# Patient Record
Sex: Female | Born: 1965 | Race: White | Hispanic: No | Marital: Single | Smoking: Current every day smoker
Health system: Southern US, Community
[De-identification: ages and names within clinical notes are randomized; demographics above are authoritative.]

## PROBLEM LIST (undated history)

## (undated) DIAGNOSIS — M1990 Unspecified osteoarthritis, unspecified site: Secondary | ICD-10-CM

## (undated) DIAGNOSIS — I639 Cerebral infarction, unspecified: Secondary | ICD-10-CM

## (undated) DIAGNOSIS — I2699 Other pulmonary embolism without acute cor pulmonale: Secondary | ICD-10-CM

## (undated) DIAGNOSIS — F329 Major depressive disorder, single episode, unspecified: Secondary | ICD-10-CM

## (undated) DIAGNOSIS — I8290 Acute embolism and thrombosis of unspecified vein: Secondary | ICD-10-CM

## (undated) DIAGNOSIS — G459 Transient cerebral ischemic attack, unspecified: Secondary | ICD-10-CM

## (undated) DIAGNOSIS — I82409 Acute embolism and thrombosis of unspecified deep veins of unspecified lower extremity: Secondary | ICD-10-CM

## (undated) DIAGNOSIS — F32A Depression, unspecified: Secondary | ICD-10-CM

## (undated) DIAGNOSIS — F419 Anxiety disorder, unspecified: Secondary | ICD-10-CM

## (undated) HISTORY — PX: HERNIA REPAIR: SHX51

## (undated) HISTORY — DX: Acute embolism and thrombosis of unspecified deep veins of unspecified lower extremity: I82.409

## (undated) HISTORY — DX: Cerebral infarction, unspecified: I63.9

## (undated) HISTORY — PX: TUBAL LIGATION: SHX77

## (undated) HISTORY — PX: SKIN GRAFT: SHX250

## (undated) HISTORY — DX: Transient cerebral ischemic attack, unspecified: G45.9

## (undated) HISTORY — PX: OTHER SURGICAL HISTORY: SHX169

## (undated) HISTORY — PX: ECTOPIC PREGNANCY SURGERY: SHX613

## (undated) HISTORY — DX: Acute embolism and thrombosis of unspecified vein: I82.90

---

## 1999-01-12 HISTORY — PX: HERNIA REPAIR: SHX51

## 2010-09-15 ENCOUNTER — Ambulatory Visit: Payer: Self-pay | Admitting: Family Medicine

## 2010-10-01 ENCOUNTER — Emergency Department: Payer: Self-pay | Admitting: *Deleted

## 2012-01-12 DIAGNOSIS — I82409 Acute embolism and thrombosis of unspecified deep veins of unspecified lower extremity: Secondary | ICD-10-CM

## 2012-01-12 DIAGNOSIS — G459 Transient cerebral ischemic attack, unspecified: Secondary | ICD-10-CM

## 2012-01-12 HISTORY — DX: Acute embolism and thrombosis of unspecified deep veins of unspecified lower extremity: I82.409

## 2012-01-12 HISTORY — DX: Transient cerebral ischemic attack, unspecified: G45.9

## 2012-02-18 ENCOUNTER — Emergency Department: Payer: Self-pay | Admitting: Emergency Medicine

## 2012-02-18 LAB — COMPREHENSIVE METABOLIC PANEL
Anion Gap: 10 (ref 7–16)
BUN: 5 mg/dL — ABNORMAL LOW (ref 7–18)
Chloride: 102 mmol/L (ref 98–107)
Creatinine: 0.79 mg/dL (ref 0.60–1.30)
EGFR (African American): >60
EGFR (Non-African Amer.): >60
Glucose: 163 mg/dL — ABNORMAL HIGH (ref 65–99)
Osmolality: 273 (ref 275–301)
Potassium: 3.8 mmol/L (ref 3.5–5.1)
SGPT (ALT): 16 U/L (ref 12–78)
Total Protein: 7.8 g/dL (ref 6.4–8.2)

## 2012-02-18 LAB — CBC WITH DIFFERENTIAL/PLATELET
Basophil #: 0.2 10*3/uL — ABNORMAL HIGH (ref 0.0–0.1)
Basophil %: 1.3 %
Eosinophil %: 1.1 %
Lymphocyte %: 13.7 %
Monocyte %: 7.3 %
Neutrophil #: 10.4 10*3/uL — ABNORMAL HIGH (ref 1.4–6.5)
Neutrophil %: 76.6 %
Platelet: 307 10*3/uL (ref 150–440)
WBC: 13.5 10*3/uL — ABNORMAL HIGH (ref 3.6–11.0)

## 2012-02-18 LAB — PROTIME-INR: Prothrombin Time: 15.5 secs — ABNORMAL HIGH (ref 11.5–14.7)

## 2012-02-18 LAB — APTT: Activated PTT: 36 secs — ABNORMAL HIGH (ref 23.6–35.9)

## 2012-02-18 LAB — RAPID INFLUENZA A&B ANTIGENS

## 2012-02-18 LAB — CK TOTAL AND CKMB (NOT AT ARMC)
CK, Total: 46 U/L (ref 21–215)
CK-MB: 1.7 ng/mL (ref 0.5–3.6)

## 2012-02-21 DIAGNOSIS — I2699 Other pulmonary embolism without acute cor pulmonale: Secondary | ICD-10-CM | POA: Insufficient documentation

## 2012-02-21 DIAGNOSIS — Z9889 Other specified postprocedural states: Secondary | ICD-10-CM | POA: Insufficient documentation

## 2012-02-21 DIAGNOSIS — I745 Embolism and thrombosis of iliac artery: Secondary | ICD-10-CM | POA: Insufficient documentation

## 2012-02-24 LAB — CULTURE, BLOOD (SINGLE)

## 2012-05-05 DIAGNOSIS — S31109A Unspecified open wound of abdominal wall, unspecified quadrant without penetration into peritoneal cavity, initial encounter: Secondary | ICD-10-CM | POA: Insufficient documentation

## 2012-05-15 ENCOUNTER — Encounter: Payer: Self-pay | Admitting: Family Medicine

## 2012-05-19 DIAGNOSIS — IMO0001 Reserved for inherently not codable concepts without codable children: Secondary | ICD-10-CM | POA: Insufficient documentation

## 2012-05-19 DIAGNOSIS — I70219 Atherosclerosis of native arteries of extremities with intermittent claudication, unspecified extremity: Secondary | ICD-10-CM | POA: Insufficient documentation

## 2012-06-11 ENCOUNTER — Encounter: Payer: Self-pay | Admitting: Family Medicine

## 2012-09-21 ENCOUNTER — Ambulatory Visit: Payer: Self-pay | Admitting: Internal Medicine

## 2015-04-18 ENCOUNTER — Emergency Department: Payer: Medicaid Other

## 2015-04-18 ENCOUNTER — Encounter: Payer: Self-pay | Admitting: Emergency Medicine

## 2015-04-18 ENCOUNTER — Inpatient Hospital Stay
Admission: EM | Admit: 2015-04-18 | Discharge: 2015-04-24 | DRG: 190 | Disposition: A | Discharge status: Home / Self Care | Payer: Medicaid Other | Attending: Internal Medicine | Admitting: Internal Medicine

## 2015-04-18 DIAGNOSIS — Z8249 Family history of ischemic heart disease and other diseases of the circulatory system: Secondary | ICD-10-CM | POA: Diagnosis not present

## 2015-04-18 DIAGNOSIS — I10 Essential (primary) hypertension: Secondary | ICD-10-CM | POA: Diagnosis present

## 2015-04-18 DIAGNOSIS — J441 Chronic obstructive pulmonary disease with (acute) exacerbation: Secondary | ICD-10-CM | POA: Diagnosis present

## 2015-04-18 DIAGNOSIS — Z86718 Personal history of other venous thrombosis and embolism: Secondary | ICD-10-CM | POA: Diagnosis not present

## 2015-04-18 DIAGNOSIS — Z7901 Long term (current) use of anticoagulants: Secondary | ICD-10-CM

## 2015-04-18 DIAGNOSIS — J189 Pneumonia, unspecified organism: Secondary | ICD-10-CM | POA: Diagnosis present

## 2015-04-18 DIAGNOSIS — Z86711 Personal history of pulmonary embolism: Secondary | ICD-10-CM

## 2015-04-18 DIAGNOSIS — Z79899 Other long term (current) drug therapy: Secondary | ICD-10-CM

## 2015-04-18 DIAGNOSIS — Z833 Family history of diabetes mellitus: Secondary | ICD-10-CM | POA: Diagnosis not present

## 2015-04-18 DIAGNOSIS — Z825 Family history of asthma and other chronic lower respiratory diseases: Secondary | ICD-10-CM | POA: Diagnosis not present

## 2015-04-18 DIAGNOSIS — F418 Other specified anxiety disorders: Secondary | ICD-10-CM | POA: Diagnosis present

## 2015-04-18 DIAGNOSIS — Z8673 Personal history of transient ischemic attack (TIA), and cerebral infarction without residual deficits: Secondary | ICD-10-CM | POA: Diagnosis not present

## 2015-04-18 DIAGNOSIS — J9601 Acute respiratory failure with hypoxia: Secondary | ICD-10-CM | POA: Diagnosis present

## 2015-04-18 DIAGNOSIS — Z87891 Personal history of nicotine dependence: Secondary | ICD-10-CM | POA: Diagnosis not present

## 2015-04-18 DIAGNOSIS — R0602 Shortness of breath: Secondary | ICD-10-CM

## 2015-04-18 DIAGNOSIS — J440 Chronic obstructive pulmonary disease with acute lower respiratory infection: Principal | ICD-10-CM | POA: Diagnosis present

## 2015-04-18 HISTORY — DX: Anxiety disorder, unspecified: F41.9

## 2015-04-18 HISTORY — DX: Depression, unspecified: F32.A

## 2015-04-18 HISTORY — DX: Unspecified osteoarthritis, unspecified site: M19.90

## 2015-04-18 HISTORY — DX: Other pulmonary embolism without acute cor pulmonale: I26.99

## 2015-04-18 HISTORY — DX: Acute embolism and thrombosis of unspecified deep veins of unspecified lower extremity: I82.409

## 2015-04-18 HISTORY — DX: Major depressive disorder, single episode, unspecified: F32.9

## 2015-04-18 LAB — PROTIME-INR
INR: 2.5
Prothrombin Time: 26.7 seconds — ABNORMAL HIGH (ref 11.4–15.0)

## 2015-04-18 LAB — CBC WITH DIFFERENTIAL/PLATELET
BASOS ABS: 0 10*3/uL (ref 0–0.1)
Basophils Relative: 0 %
EOS PCT: 2 %
Eosinophils Absolute: 0.2 10*3/uL (ref 0–0.7)
HCT: 47.2 % — ABNORMAL HIGH (ref 35.0–47.0)
Hemoglobin: 15.4 g/dL (ref 12.0–16.0)
LYMPHS PCT: 24 %
Lymphs Abs: 2.3 10*3/uL (ref 1.0–3.6)
MCH: 28.5 pg (ref 26.0–34.0)
MCHC: 32.6 g/dL (ref 32.0–36.0)
MCV: 87.6 fL (ref 80.0–100.0)
MONO ABS: 0.6 10*3/uL (ref 0.2–0.9)
Monocytes Relative: 6 %
Neutro Abs: 6.7 10*3/uL — ABNORMAL HIGH (ref 1.4–6.5)
Neutrophils Relative %: 68 %
PLATELETS: 247 10*3/uL (ref 150–440)
RBC: 5.39 MIL/uL — ABNORMAL HIGH (ref 3.80–5.20)
RDW: 15.1 % — AB (ref 11.5–14.5)
WBC: 9.8 10*3/uL (ref 3.6–11.0)

## 2015-04-18 LAB — BASIC METABOLIC PANEL
ANION GAP: 6 (ref 5–15)
BUN: 14 mg/dL (ref 6–20)
CALCIUM: 9.2 mg/dL (ref 8.9–10.3)
CO2: 29 mmol/L (ref 22–32)
CREATININE: 0.74 mg/dL (ref 0.44–1.00)
Chloride: 102 mmol/L (ref 101–111)
GFR calc Af Amer: >60 mL/min (ref >60)
Glucose, Bld: 80 mg/dL (ref 65–99)
Potassium: 3.3 mmol/L — ABNORMAL LOW (ref 3.5–5.1)
Sodium: 137 mmol/L (ref 135–145)

## 2015-04-18 LAB — TROPONIN I: Troponin I: <0.03 ng/mL (ref <0.031)

## 2015-04-18 LAB — RAPID INFLUENZA A&B ANTIGENS (ARMC ONLY): INFLUENZA B (ARMC): NEGATIVE

## 2015-04-18 LAB — RAPID INFLUENZA A&B ANTIGENS: Influenza A (ARMC): NEGATIVE

## 2015-04-18 MED ORDER — AZITHROMYCIN 250 MG PO TABS
250.0000 mg | ORAL_TABLET | Freq: Every day | ORAL | Status: DC
  Administered 2015-04-19 – 2015-04-24 (×6): 250 mg via ORAL
  Filled 2015-04-18 (×6): qty 1

## 2015-04-18 MED ORDER — ESCITALOPRAM OXALATE 10 MG PO TABS
10.0000 mg | ORAL_TABLET | Freq: Every day | ORAL | Status: DC
  Administered 2015-04-18 – 2015-04-23 (×6): 10 mg via ORAL
  Filled 2015-04-18 (×6): qty 1

## 2015-04-18 MED ORDER — LORATADINE 10 MG PO TABS
10.0000 mg | ORAL_TABLET | Freq: Every day | ORAL | Status: DC
  Administered 2015-04-19 – 2015-04-24 (×6): 10 mg via ORAL
  Filled 2015-04-18 (×6): qty 1

## 2015-04-18 MED ORDER — LORATADINE 10 MG PO TABS: 10.0000 mg | ORAL_TABLET | Freq: Every day | ORAL | Status: DC

## 2015-04-18 MED ORDER — ACETAMINOPHEN 650 MG RE SUPP: 650.0000 mg | Freq: Four times a day (QID) | RECTAL | Status: DC | PRN

## 2015-04-18 MED ORDER — IPRATROPIUM-ALBUTEROL 0.5-2.5 (3) MG/3ML IN SOLN
3.0000 mL | Freq: Once | RESPIRATORY_TRACT | Status: AC
  Administered 2015-04-18: 3 mL via RESPIRATORY_TRACT
  Filled 2015-04-18: qty 3

## 2015-04-18 MED ORDER — ADULT MULTIVITAMIN W/MINERALS CH
1.0000 | ORAL_TABLET | Freq: Every day | ORAL | Status: DC
  Administered 2015-04-19 – 2015-04-24 (×6): 1 via ORAL
  Filled 2015-04-18 (×6): qty 1

## 2015-04-18 MED ORDER — DEXTROSE 5 % IV SOLN
2.0000 g | Freq: Once | INTRAVENOUS | Status: AC
  Administered 2015-04-18: 2 g via INTRAVENOUS
  Filled 2015-04-18: qty 2

## 2015-04-18 MED ORDER — ENOXAPARIN SODIUM 40 MG/0.4ML ~~LOC~~ SOLN
40.0000 mg | SUBCUTANEOUS | Status: DC
  Administered 2015-04-18: 23:00:00 40 mg via SUBCUTANEOUS
  Filled 2015-04-18: qty 0.4

## 2015-04-18 MED ORDER — METHYLPREDNISOLONE SODIUM SUCC 125 MG IJ SOLR
60.0000 mg | Freq: Four times a day (QID) | INTRAMUSCULAR | Status: DC
  Administered 2015-04-18 – 2015-04-22 (×16): 60 mg via INTRAVENOUS
  Filled 2015-04-18 (×16): qty 2

## 2015-04-18 MED ORDER — ADULT MULTIVITAMIN W/MINERALS CH: 1.0000 | ORAL_TABLET | Freq: Every day | ORAL | Status: DC

## 2015-04-18 MED ORDER — WARFARIN SODIUM 4 MG PO TABS
4.0000 mg | ORAL_TABLET | Freq: Every evening | ORAL | Status: DC
  Administered 2015-04-18 – 2015-04-20 (×3): 4 mg via ORAL
  Filled 2015-04-18 (×4): qty 1

## 2015-04-18 MED ORDER — ACETAMINOPHEN 325 MG PO TABS: 650.0000 mg | ORAL_TABLET | Freq: Four times a day (QID) | ORAL | Status: DC | PRN

## 2015-04-18 MED ORDER — IPRATROPIUM-ALBUTEROL 0.5-2.5 (3) MG/3ML IN SOLN
3.0000 mL | Freq: Four times a day (QID) | RESPIRATORY_TRACT | Status: DC
  Administered 2015-04-18 – 2015-04-24 (×22): 3 mL via RESPIRATORY_TRACT
  Filled 2015-04-18 (×23): qty 3

## 2015-04-18 MED ORDER — DEXTROSE 5 % IV SOLN
500.0000 mg | INTRAVENOUS | Status: AC
  Administered 2015-04-18: 500 mg via INTRAVENOUS
  Filled 2015-04-18: qty 500

## 2015-04-18 MED ORDER — BUDESONIDE 0.25 MG/2ML IN SUSP
0.2500 mg | Freq: Two times a day (BID) | RESPIRATORY_TRACT | Status: DC
  Administered 2015-04-18 – 2015-04-24 (×12): 0.25 mg via RESPIRATORY_TRACT
  Filled 2015-04-18 (×12): qty 2

## 2015-04-18 MED ORDER — CLONAZEPAM 0.5 MG PO TABS
0.5000 mg | ORAL_TABLET | Freq: Two times a day (BID) | ORAL | Status: DC
  Administered 2015-04-18 – 2015-04-24 (×12): 0.5 mg via ORAL
  Filled 2015-04-18 (×12): qty 1

## 2015-04-18 MED ORDER — ENOXAPARIN SODIUM 40 MG/0.4ML ~~LOC~~ SOLN: 40.0000 mg | SUBCUTANEOUS | Status: DC

## 2015-04-18 MED ORDER — DEXTROSE 5 % IV SOLN
2.0000 g | INTRAVENOUS | Status: DC
  Administered 2015-04-19 – 2015-04-23 (×5): 2 g via INTRAVENOUS
  Filled 2015-04-18 (×6): qty 2

## 2015-04-18 NOTE — H&P (Signed)
[facility] [place] [facility]   PATIENT NAME: Anna Adkins    MR#:  [id number]  DATE OF BIRTH:  1966/01/07  DATE OF ADMISSION:  04/18/2015  PRIMARY CARE PHYSICIAN: Pricilla Holm, MD   REQUESTING/REFERRING PHYSICIAN: Dr. Sharyn Creamer  CHIEF COMPLAINT:   Chief Complaint  Patient presents with  . hypoxia     HISTORY OF PRESENT ILLNESS:  Anna Adkins  is a 50 y.o. female sent in by PMD to the ER for hypoxia. The patient has not felt well for a while. She is having some chest tightness, no energy and feeling exhausted. She's been coughing sneezing and short of breath. She's been wheezing. About a week ago, she had a headache for about 3 days. On Wednesday she saw her doctor and her pulse ox was low at that time in the 60s. She received a nebulizer treatment and a shot of antibiotic. She was brought back for reevaluation today and found to have a low pulse ox and sent into the ER for further evaluation. Hospitalist services were contacted for further evaluation  PAST MEDICAL HISTORY:   Past Medical History  Diagnosis Date  . Arthritis   . Pulmonary emboli (HCC)   . DVT (deep venous thrombosis) (HCC)   . Depression   . Anxiety     PAST SURGICAL HISTORY:   Past Surgical History  Procedure Laterality Date  . Tubal ligation    . Hernia repair    . Blood clot removal    . Skin graft      SOCIAL HISTORY:   Social History  Substance Use Topics  . Smoking status: Former Games developer  . Smokeless tobacco: Not on file  . Alcohol Use: No    FAMILY HISTORY:   Family History  Problem Relation Age of Onset  . Healthy Mother   . Heart disease Father   . Diabetes Father   . Asthma Father     DRUG ALLERGIES:   Allergies  Allergen Reactions  . Sulfa Antibiotics Anaphylaxis    REVIEW OF SYSTEMS:  CONSTITUTIONAL: Low-grade fever, hot and cold feeling, positive for fatigue. Positive for weight loss. No appetite. EYES: No blurred or double  vision. Wears glasses EARS, NOSE, AND THROAT: No tinnitus or ear pain. Positive for sore throat. Positive for sinus infection RESPIRATORY: Positive for cough and shortness of breath, positive for wheezing. No hemoptysis. Cough productive of greenish phlegm. CARDIOVASCULAR: Positive for chest pain, no orthopnea, no edema.  GASTROINTESTINAL: No nausea, vomiting, diarrhea or abdominal pain. No blood in bowel movements GENITOURINARY: No dysuria, hematuria.  ENDOCRINE: No polyuria, nocturia,  HEMATOLOGY: No anemia, easy bruising or bleeding SKIN: No rash or lesion. MUSCULOSKELETAL: No joint pain or arthritis.   NEUROLOGIC: No tingling, numbness, weakness.  PSYCHIATRY: Positive for anxiety and depression.   MEDICATIONS AT HOME:   Prior to Admission medications   Medication Sig Start Date End Date Taking? Authorizing Provider  Biotin 1000 MCG tablet Take 1,000 mcg by mouth daily.   Yes Historical Provider, MD  clonazePAM (KLONOPIN) 0.5 MG tablet Take 0.5 mg by mouth 2 (two) times daily.   Yes Historical Provider, MD  escitalopram (LEXAPRO) 10 MG tablet Take 10 mg by mouth at bedtime.   Yes Historical Provider, MD  loratadine (CLARITIN) 10 MG tablet Take 10 mg by mouth daily.   Yes Historical Provider, MD  Multiple Vitamin (MULTIVITAMIN WITH MINERALS) TABS tablet Take 1 tablet by mouth daily.   Yes Historical Provider, MD  warfarin (  COUMADIN) 4 MG tablet Take 4 mg by mouth every evening.   Yes Historical Provider, MD      VITAL SIGNS:  Blood pressure 100/41, pulse 73, temperature 98.3 F (36.8 C), temperature source Oral, resp. rate 18, height  (1.778 m), weight 113.399 kg (250 lb), SpO2 91 %.  PHYSICAL EXAMINATION:  GENERAL:  50 y.o.-year-old patient lying in the bed with no acute distress.  EYES: Pupils equal, round, reactive to light and accommodation. No scleral icterus. Extraocular muscles intact.  HEENT: Head atraumatic, normocephalic. Oropharynx and nasopharynx clear.  NECK:   Supple, no jugular venous distention. No thyroid enlargement, no tenderness.  LUNGS: Decreased breath sounds bilaterally, poor air entry, positive for wheezing throughout entire lung field. No rales, rhonchi or crepitation. No use of accessory muscles of respiration.  CARDIOVASCULAR: S1, S2 normal. No murmurs, rubs, or gallops.  ABDOMEN: Soft, nontender, nondistended. Bowel sounds present. No organomegaly or mass.  EXTREMITIES: No pedal edema, cyanosis, or clubbing.  NEUROLOGIC: Cranial nerves II through XII are intact. Muscle strength 5/5 in all extremities. Sensation intact. Gait not checked.  PSYCHIATRIC: The patient is alert and oriented x 3.  SKIN: No rash, lesion, or ulcer.   LABORATORY PANEL:   CBC  Recent Labs Lab 04/18/15 1339  WBC 9.8  HGB 15.4  HCT 47.2*  PLT 247   ------------------------------------------------------------------------------------------------------------------  Chemistries   Recent Labs Lab 04/18/15 1339  NA 137  K 3.3*  CL 102  CO2 29  GLUCOSE 80  BUN 14  CREATININE 0.74  CALCIUM 9.2   ------------------------------------------------------------------------------------------------------------------  Cardiac Enzymes  Recent Labs Lab 04/18/15 1339  TROPONINI <0.03   ------------------------------------------------------------------------------------------------------------------  RADIOLOGY:  Dg Chest 2 View  04/18/2015  CLINICAL DATA:  50 year old presenting with acute hypoxia while at her primary provider's office earlier today, oxygen saturation on room air 88%. Recent onset of shortness of breath and productive cough. Patient currently being treated for sinusitis with antibiotics and prednisone EXAM: CHEST  2 VIEW COMPARISON:  02/18/2012. FINDINGS: Cardiac silhouette upper normal in size, unchanged. Thoracic aorta mildly atherosclerotic, unchanged. Hilar and mediastinal contours otherwise unremarkable. Linear atelectasis in the left  upper lobe. Streaky and patchy airspace opacity in the right middle lobe. Prominent bronchovascular markings diffusely and moderate central peribronchial thickening. No pleural effusions. Pulmonary vascularity normal. Mild degenerative changes involving the thoracic spine with slight exaggeration of the usual thoracic kyphosis. IMPRESSION: Right middle lobe bronchopneumonia superimposed upon moderate changes of acute bronchitis and/or asthma. Linear atelectasis in the left upper lobe. Electronically Signed   By: Hulan Saas M.D.   On: 04/18/2015 14:56    EKG:   Sinus rhythm 72 bpm  IMPRESSION AND PLAN:   1.  Acute respiratory failure with hypoxia. Pulse ox 78 percent on room air. Continue oxygen supplementation. 2. COPD exacerbation start high-dose Solu-Medrol DuoNeb nebulizer solution and budesonide nebulizers. Antibiotics were ordered. 3. Right middle lobe pneumonia. Start Rocephin and Zithromax. Follow-up blood cultures. Order sputum culture. 4. Patient's blood pressure is normally on the lower side 5. 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 On chronic Coumadin and INR is therapeutic. 6. History of CVA 7. Anxiety depression continue current medications   All the records are reviewed and case discussed with ED provider. Management plans discussed with the patient, family and they are in agreement.  CODE STATUS: Full code  TOTAL TIME TAKING CARE OF THIS PATIENT: 50 minutes.    Alford Highland M.D on 04/18/2015 at  4:08 PM  Between 7am to 6pm - Pager - [phone]  After 6pm call admission pager [phone]  [facility] Office  [phone]  CC: Primary care physician; Pricilla HolmSHARPE, LESLIE M, MD

## 2015-04-18 NOTE — ED Provider Notes (Signed)
[facility] Emergency Department Provider Note  ____________________________________________  Time seen: Approximately 2:12 PM  I have reviewed the triage vital signs and the nursing notes.   HISTORY  Chief Complaint hypoxia     HPI Anna Adkins is a 50 y.o. female day history of multiple DVTs and pulmonary embolism currently on Coumadin.  Patient reports about 5 days ago she started to develop a slight cough, congestion, and runny nose. She states that the last couple days it sort of "moved into her lungs" with a productive cough with green sputum. She developed some very mild shortness of breath, mostly with walking. Denies any increased leg swelling that she has chronically swollen legs due to surgery associated with previous large DVTs.  She is not having any chest pain, no sharp discomfort in the chest. She states she feels much better now she is on oxygen.  Prior history DVT as well as pulmonary mode him  Past Medical History  Diagnosis Date  . Arthritis   . Pulmonary emboli (HCC)   . DVT (deep venous thrombosis) (HCC)     There are no active problems to display for this patient.   No past surgical history on file.  Current Outpatient Rx  Name  Route  Sig  Dispense  Refill  . Biotin 1000 MCG tablet   Oral   Take 1,000 mcg by mouth daily.         . clonazePAM (KLONOPIN) 0.5 MG tablet   Oral   Take 0.5 mg by mouth 2 (two) times daily.         [name]. escitalopram (LEXAPRO) 10 MG tablet   Oral   Take 10 mg by mouth at bedtime.         [name]. loratadine (CLARITIN) 10 MG tablet   Oral   Take 10 mg by mouth daily.         . Multiple Vitamin (MULTIVITAMIN WITH MINERALS) TABS tablet   Oral   Take 1 tablet by mouth daily.         [name]. warfarin (COUMADIN) 4 MG tablet   Oral   Take 4 mg by mouth every evening.           Allergies Sulfa antibiotics  No family history on file.  Social History Social History  Substance Use Topics   . Smoking status: Never Smoker   . Smokeless tobacco: Not on file  . Alcohol Use: No    Review of Systems Constitutional: No fever/chills Eyes: No visual changes. ENT: No sore throat. Was a little scratchy, also slight runny nose. Cardiovascular: Denies chest pain. Respiratory: No wheezing. See history of present illness Gastrointestinal: No abdominal pain.  No nausea, no vomiting.  No diarrhea.  No constipation. Genitourinary: Negative for dysuria. Musculoskeletal: Negative for back pain. Skin: Negative for rash. Neurological: Negative for headaches, focal weakness or numbness.  10-point ROS otherwise negative.  ____________________________________________   PHYSICAL EXAM:  VITAL SIGNS: ED Triage Vitals  Enc Vitals Group     BP 04/18/15 1321 106/67 mmHg     Pulse Rate 04/18/15 1321 79     Resp 04/18/15 1321 18     Temp 04/18/15 1321 98.3 F (36.8 C)     Temp Source 04/18/15 1321 Oral     SpO2 04/18/15 1321 78 %     Weight 04/18/15 1321 250 lb (113.399 kg)     Height 04/18/15 1321 5\' 10"  (1.778 m)     Head Cir --  Peak Flow --      Pain Score 04/18/15 1323 0     Pain Loc --      Pain Edu? --      Excl. in GC? --    Constitutional: Alert and oriented. Well appearing and in no acute distress. Eyes: Conjunctivae are normal. PERRL. EOMI. Head: Atraumatic. Nose: No congestion/rhinnorhea. Mouth/Throat: Mucous membranes are moist.  Oropharynx non-erythematous. Neck: No stridor.   Cardiovascular: Normal rate, regular rhythm. Grossly normal heart sounds.  Good peripheral circulation. Respiratory: Normal respiratory effort.  No retractions. Lungs clear in the upper lobes, questionable mild rails versus slight rhonchi in the lower lobes bilateral. Gastrointestinal: Soft and nontender. No distention. No abdominal bruits. No CVA tenderness. Musculoskeletal: No lower extremity tenderness though she does have 1+ bilateral lower extremity edema which she reports is chronic  and unchanged.  No joint effusions. Neurologic:  Normal speech and language. No gross focal neurologic deficits are appreciated. Skin:  Skin is warm, dry and intact. No rash noted. Psychiatric: Mood and affect are normal. Speech and behavior are normal.  ____________________________________________   LABS (all labs ordered are listed, but only abnormal results are displayed)  Labs Reviewed  CBC WITH DIFFERENTIAL/PLATELET - Abnormal; Notable for the following:    RBC 5.39 (*)    HCT 47.2 (*)    RDW 15.1 (*)    Neutro Abs 6.7 (*)    All other components within normal limits  BASIC METABOLIC PANEL - Abnormal; Notable for the following:    Potassium 3.3 (*)    All other components within normal limits  PROTIME-INR - Abnormal; Notable for the following:    Prothrombin Time 26.7 (*)    All other components within normal limits  RAPID INFLUENZA A&B ANTIGENS ([facility] ONLY)  CULTURE, BLOOD (ROUTINE X 2)  CULTURE, BLOOD (ROUTINE X 2)  TROPONIN I   ____________________________________________  EKG  ED ECG REPORT I, QUALE, MARK, the attending physician, personally viewed and interpreted this ECG.  Date: 04/18/2015 EKG Time: 1432 Rate: 70 Rhythm: normal sinus rhythm QRS Axis: normal Intervals: normal ST/T Wave abnormalities: Some J-point elevation, probable early re-pole pattern Conduction Disturbances: none Narrative Interpretation: unremarkable except for some probable early repolarization  The patient is not having any chest pain.  ____________________________________________  RADIOLOGY  G Chest 2 View (Final result) Result time: 04/18/15 14:56:46   Final result by Rad Results In Interface (04/18/15 14:56:46)   Narrative:   CLINICAL DATA: 50 year old presenting with acute hypoxia while at her primary provider's office earlier today, oxygen saturation on room air 88%. Recent onset of shortness of breath and productive cough. Patient currently being treated for sinusitis  with antibiotics and prednisone  EXAM: CHEST 2 VIEW  COMPARISON: 02/18/2012.  FINDINGS: Cardiac silhouette upper normal in size, unchanged. Thoracic aorta mildly atherosclerotic, unchanged. Hilar and mediastinal contours otherwise unremarkable. Linear atelectasis in the left upper lobe. Streaky and patchy airspace opacity in the right middle lobe. Prominent bronchovascular markings diffusely and moderate central peribronchial thickening. No pleural effusions. Pulmonary vascularity normal. Mild degenerative changes involving the thoracic spine with slight exaggeration of the usual thoracic kyphosis.  IMPRESSION: Right middle lobe bronchopneumonia superimposed upon moderate changes of acute bronchitis and/or asthma. Linear atelectasis in the left upper lobe.   Electronically Signed By: Hulan Saas M.D. On: 04/18/2015 14:56       ____________________________________________   PROCEDURES  Procedure(s) performed: None  Critical Care performed: Yes, see critical care note(s)  CRITICAL CARE Performed by: Sharyn Creamer  Total critical care time: 35 minutes  Critical care time was exclusive of separately billable procedures and treating other patients.  Critical care was necessary to treat or prevent imminent or life-threatening deterioration.  Critical care was time spent personally by me on the following activities: development of treatment plan with patient and/or surrogate as well as nursing, discussions with consultants, evaluation of patient's response to treatment, examination of patient, obtaining history from patient or surrogate, ordering and performing treatments and interventions, ordering and review of laboratory studies, ordering and review of radiographic studies, pulse oximetry and re-evaluation of patient's condition.  Patient presented with oxygen saturation less than 80% requiring evaluation for possible respiratory  compromise. ____________________________________________   INITIAL IMPRESSION / ASSESSMENT AND PLAN / ED COURSE  Pertinent labs & imaging results that were available during my care of the patient were reviewed by me and considered in my medical decision making (see chart for details).  Patient with productive cough, hypoxia and recent upper respiratory symptoms. Her symptoms seem to suggest most likely diagnosis of pneumonia, however she does have a history of pulmonary Moses and though she does not have any pleuritic pain or chest discomfort. She does not have any symptoms of acute coronary syndrome, her troponin is negative and her EKG demonstrates what is probably early repolarization and I doubt acute ischemia.  Patient's INR is noted be therapeutic, making pulmonary embolism certainly file is likely in review of her chest x-ray at this time appears consistent with a right middle lobe pneumonia along with her symptomatology. We will admit her, place her on antibiotics. She has not been hospitalized recently, does not have any obvious risk factors for healthcare associated pneumonia. Resting comfortable on oxygen. ____________________________________________   FINAL CLINICAL IMPRESSION(S) / ED DIAGNOSES  Final diagnoses:  Community acquired pneumonia      Sharyn Creamer, MD 04/18/15 1539

## 2015-04-18 NOTE — ED Notes (Addendum)
Patient called RN to room, patient c/o left arm pain where IV is infusing. Some redness noted on the arm, no swelling noted.  Patient also noted that her bracelet is too tight on her arm. Bracelet removed and new bracelet applied to other arm. Will monitor redness.

## 2015-04-18 NOTE — ED Notes (Signed)
Seen by PCP today for follow up.  Treated for sinus infection on Wednesday and at that time SATS low.  Today returned to PCP, initial RA SpO2 88 and improved to 94 % after albuterol neb.  Denies c/o SOB. Reports productive cough thick green sputum.

## 2015-04-18 NOTE — ED Notes (Signed)
IV antibiotics restarted, patient tolerating with no problems.

## 2015-04-18 NOTE — ED Notes (Signed)
Attempted to call report to 1C, nurse in another room at this time, will call me back

## 2015-04-18 NOTE — ED Notes (Signed)
Patient sent to ED by her PCP for hypoxia. Patient states that she was being treated for a sinus infection, patient was given antibiotics and prednisone. Patient went back for check up and her O2 sats were 88%.   Patient reports shortness of breath and productive cough with green/yellow sputum. Patient denies fever. Patient states that she has been sleeping more than normal and has had headaches.

## 2015-04-19 LAB — CBC
HCT: 42 % (ref 35.0–47.0)
HEMOGLOBIN: 13.8 g/dL (ref 12.0–16.0)
MCH: 29 pg (ref 26.0–34.0)
MCHC: 32.9 g/dL (ref 32.0–36.0)
MCV: 87.9 fL (ref 80.0–100.0)
PLATELETS: 244 10*3/uL (ref 150–440)
RBC: 4.78 MIL/uL (ref 3.80–5.20)
RDW: 15.5 % — ABNORMAL HIGH (ref 11.5–14.5)
WBC: 7.2 10*3/uL (ref 3.6–11.0)

## 2015-04-19 LAB — INFLUENZA PANEL BY PCR (TYPE A & B)
H1N1 flu by pcr: NOT DETECTED
Influenza A By PCR: NEGATIVE
Influenza B By PCR: NEGATIVE

## 2015-04-19 LAB — BASIC METABOLIC PANEL
ANION GAP: 7 (ref 5–15)
BUN: 18 mg/dL (ref 6–20)
CHLORIDE: 100 mmol/L — AB (ref 101–111)
CO2: 29 mmol/L (ref 22–32)
Calcium: 9 mg/dL (ref 8.9–10.3)
Creatinine, Ser: 0.82 mg/dL (ref 0.44–1.00)
GFR calc non Af Amer: >60 mL/min (ref >60)
Glucose, Bld: 163 mg/dL — ABNORMAL HIGH (ref 65–99)
POTASSIUM: 4.1 mmol/L (ref 3.5–5.1)
SODIUM: 136 mmol/L (ref 135–145)

## 2015-04-19 LAB — PROTIME-INR
INR: 2.59
PROTHROMBIN TIME: 27.4 s — AB (ref 11.4–15.0)

## 2015-04-19 MED ORDER — WARFARIN - PHYSICIAN DOSING INPATIENT
Freq: Every day | Status: DC
  Administered 2015-04-19: 18:00:00

## 2015-04-19 MED ORDER — GUAIFENESIN ER 600 MG PO TB12
600.0000 mg | ORAL_TABLET | Freq: Two times a day (BID) | ORAL | Status: DC
  Administered 2015-04-19 – 2015-04-24 (×11): 600 mg via ORAL
  Filled 2015-04-19 (×11): qty 1

## 2015-04-19 MED ORDER — SALINE SPRAY 0.65 % NA SOLN
1.0000 | NASAL | Status: DC | PRN
  Administered 2015-04-19 – 2015-04-20 (×2): 1 via NASAL
  Filled 2015-04-19: qty 44

## 2015-04-19 NOTE — Progress Notes (Signed)
Initial Nutrition Assessment  DOCUMENTATION CODES:   Obesity unspecified  INTERVENTION:   Meals and Snacks: Cater to patient preferences; discussed smaller, more frequent meals until appetite returns. Pt agreeable to snacks between meals, orders entered. Pt on Regular diet, family/friends may continue to bring in outside food if pt prefers  NUTRITION DIAGNOSIS:   Inadequate oral intake related to acute illness, poor appetite as evidenced by per patient/family report.  GOAL:   Patient will meet greater than or equal to 90% of their needs  MONITOR:   PO intake, Weight trends  REASON FOR ASSESSMENT:   Malnutrition Screening Tool    ASSESSMENT:   50 yo female admitted with acute respiratory failure due to COPD exacerbation and pneumonia  Past Medical History  Diagnosis Date  . Arthritis   . Pulmonary emboli (HCC)   . DVT (deep venous thrombosis) (HCC)   . Depression   . Anxiety      Diet Order:  Diet regular Room service appropriate?: Yes; Fluid consistency:: Thin   Energy Intake: pt eating outside food on visit today (chicken sandwich from [employer])  Food and Nutrition Related History: pt does report poor appetite over the past 3-4 weeks; appetite down due to sinus drainage and altered taste per pt. Pt eating some but not as good as she normally does   Skin:  Reviewed, no issues  Last BM:  04/18/15   Labs: reviewed  Meds: MVI, solumedrol  Nutrition Focused Physical Exam: Nutrition-Focused physical exam completed. Findings are WDL for fat depletion, muscle depletion, and edema.    Height:   Ht Readings from Last 1 Encounters:  04/18/15 5\' 10"  (1.778 m)    Weight: pt reports 10 pound wt loss in 3 weeks; 3.8% wt loss. Pt reports she has been trying to lose wt (5-10 pounds per month is what was recommended to her by MD) but pt thinks was concerned when she lost 10 pounds in <1 month  Wt Readings from Last 1 Encounters:  04/18/15 250 lb (113.399 kg)     BMI:  Body mass index is 35.87 kg/(m^2).  Estimated Nutritional Needs:   Kcal:  2000-2300 kcals   Protein:  68-82 g   Fluid:  >2 L per day  EDUCATION NEEDS:   No education needs identified at this time  Romelle StarcherCate Willadeen Colantuono MS, RD, LDN [phone] Pager  [phone] Weekend/On-Call Pager

## 2015-04-19 NOTE — Progress Notes (Signed)
[facility] [place] [facility]                                                                                                                                                                                            Patient Demographics   Anna Adkins, is a 50 y.o. female, DOB - 05/14/1965, [id number]  Admit date - 04/18/2015   Admitting Physician Alford Highlandichard Wieting, MD  Outpatient Primary MD for the patient is SHARPE, Dewitt RotaLESLIE M, MD   LOS - 1  Subjective: Patient continues to complain of cough and shortness of breath and wheezing     Review of Systems:   CONSTITUTIONAL: No documented fever. No fatigue, weakness. No weight gain, no weight loss.  EYES: No blurry or double vision.  ENT: No tinnitus. No postnasal drip. No redness of the oropharynx.  RESPIRATORY: Positive cough, positive wheeze, no hemoptysis. Positive dyspnea.  CARDIOVASCULAR: No chest pain. No orthopnea. No palpitations. No syncope.  GASTROINTESTINAL: No nausea, no vomiting or diarrhea. No abdominal pain. No melena or hematochezia.  GENITOURINARY: No dysuria or hematuria.  ENDOCRINE: No polyuria or nocturia. No heat or cold intolerance.  HEMATOLOGY: No anemia. No bruising. No bleeding.  INTEGUMENTARY: No rashes. No lesions.  MUSCULOSKELETAL: No arthritis. No swelling. No gout.  NEUROLOGIC: No numbness, tingling, or ataxia. No seizure-type activity.  PSYCHIATRIC: No anxiety. No insomnia. No ADD.    Vitals:   Filed Vitals:   04/19/15 0226 04/19/15 0508 04/19/15 0528 04/19/15 0813  BP:  99/53 95/53   Pulse:  69 70   Temp:  97.5 F (36.4 C)    TempSrc:  Oral    Resp:  16    Height:      Weight:      SpO2: 90% 86% 90% 90%    Wt Readings from Last 3 Encounters:  04/18/15 113.399 kg (250 lb)    No intake or output data in the 24 hours ending 04/19/15 1037  Physical Exam:   GENERAL: Pleasant-appearing in no apparent distress.  HEAD, EYES, EARS, NOSE AND THROAT: Atraumatic,  normocephalic. Extraocular muscles are intact. Pupils equal and reactive to light. Sclerae anicteric. No conjunctival injection. No oro-pharyngeal erythema.  NECK: Supple. There is no jugular venous distention. No bruits, no lymphadenopathy, no thyromegaly.  HEART: Regular rate and rhythm,. No murmurs, no rubs, no clicks.  LUNGS: Bilateral wheezing, no sensory muscle usage, no crackles or rhonchi  ABDOMEN: Soft, flat, nontender, nondistended. Has good bowel sounds. No hepatosplenomegaly appreciated.  EXTREMITIES: No evidence of any cyanosis, clubbing, or peripheral edema.  +2 pedal and radial pulses bilaterally.  NEUROLOGIC: The patient  is alert, awake, and oriented x3 with no focal motor or sensory deficits appreciated bilaterally.  SKIN: Moist and warm with no rashes appreciated.  Psych: Not anxious, depressed LN: No inguinal LN enlargement    Antibiotics   Anti-infectives    Start     Dose/Rate Route Frequency Ordered Stop   04/19/15 1800  cefTRIAXone (ROCEPHIN) 2 g in dextrose 5 % 50 mL IVPB     2 g 100 mL/hr over 30 Minutes Intravenous Every 24 hours 04/18/15 1603     04/19/15 1600  azithromycin (ZITHROMAX) tablet 250 mg     250 mg Oral Daily 04/18/15 1604     04/18/15 1515  cefTRIAXone (ROCEPHIN) 2 g in dextrose 5 % 50 mL IVPB     2 g 100 mL/hr over 30 Minutes Intravenous  Once 04/18/15 1514 04/18/15 1911   04/18/15 1515  azithromycin (ZITHROMAX) 500 mg in dextrose 5 % 250 mL IVPB     500 mg 250 mL/hr over 60 Minutes Intravenous STAT 04/18/15 1514 04/18/15 1722      Medications   Scheduled Meds: . azithromycin  250 mg Oral Daily  . budesonide (PULMICORT) nebulizer solution  0.25 mg Nebulization BID  . cefTRIAXone (ROCEPHIN)  IV  2 g Intravenous Q24H  . clonazePAM  0.5 mg Oral BID  . enoxaparin (LOVENOX) injection  40 mg Subcutaneous Q24H  . escitalopram  10 mg Oral QHS  . ipratropium-albuterol  3 mL Nebulization Q6H  . loratadine  10 mg Oral Daily  . methylPREDNISolone  (SOLU-MEDROL) injection  60 mg Intravenous Q6H  . multivitamin with minerals  1 tablet Oral Daily  . warfarin  4 mg Oral QPM   Continuous Infusions:  PRN Meds:.acetaminophen **OR** acetaminophen   Data Review:   Micro Results Recent Results (from the past 240 hour(s))  Rapid Influenza A&B Antigens ([facility] only)     Status: None   Collection Time: 04/18/15  2:08 PM  Result Value Ref Range Status   Influenza A ([facility]) NEGATIVE NEGATIVE Final   Influenza B [facility]) NEGATIVE NEGATIVE Final    Radiology Reports Dg Chest 2 View  04/18/2015  CLINICAL DATA:  50 year old presenting with acute hypoxia while at her primary provider's office earlier today, oxygen saturation on room air 88%. Recent onset of shortness of breath and productive cough. Patient currently being treated for sinusitis with antibiotics and prednisone EXAM: CHEST  2 VIEW COMPARISON:  02/18/2012. FINDINGS: Cardiac silhouette upper normal in size, unchanged. Thoracic aorta mildly atherosclerotic, unchanged. Hilar and mediastinal contours otherwise unremarkable. Linear atelectasis in the left upper lobe. Streaky and patchy airspace opacity in the right middle lobe. Prominent bronchovascular markings diffusely and moderate central peribronchial thickening. No pleural effusions. Pulmonary vascularity normal. Mild degenerative changes involving the thoracic spine with slight exaggeration of the usual thoracic kyphosis. IMPRESSION: Right middle lobe bronchopneumonia superimposed upon moderate changes of acute bronchitis and/or asthma. Linear atelectasis in the left upper lobe. Electronically Signed   By: Hulan Saas M.D.   On: 04/18/2015 14:56     CBC  Recent Labs Lab 04/18/15 1339 04/19/15 0456  WBC 9.8 7.2  HGB 15.4 13.8  HCT 47.2* 42.0  PLT 247 244  MCV 87.6 87.9  MCH 28.5 29.0  MCHC 32.6 32.9  RDW 15.1* 15.5*  LYMPHSABS 2.3  --   MONOABS 0.6  --   EOSABS 0.2  --   BASOSABS 0.0  --     Chemistries   Recent  Labs Lab 04/18/15 1339 04/19/15 0456  NA 137 136  K 3.3* 4.1  CL 102 100*  CO2 29 29  GLUCOSE 80 163*  BUN 14 18  CREATININE 0.74 0.82  CALCIUM 9.2 9.0   ------------------------------------------------------------------------------------------------------------------ estimated creatinine clearance is 110.8 mL/min (by C-G formula based on Cr of 0.82). ------------------------------------------------------------------------------------------------------------------ No results for input(s): HGBA1C in the last 72 hours. ------------------------------------------------------------------------------------------------------------------ No results for input(s): CHOL, HDL, LDLCALC, TRIG, CHOLHDL, LDLDIRECT in the last 72 hours. ------------------------------------------------------------------------------------------------------------------ No results for input(s): TSH, T4TOTAL, T3FREE, THYROIDAB in the last 72 hours.  Invalid input(s): FREET3 ------------------------------------------------------------------------------------------------------------------ No results for input(s): VITAMINB12, FOLATE, FERRITIN, TIBC, IRON, RETICCTPCT in the last 72 hours.  Coagulation profile  Recent Labs Lab 04/18/15 1339  INR 2.50    No results for input(s): DDIMER in the last 72 hours.  Cardiac Enzymes  Recent Labs Lab 04/18/15 1339  TROPONINI <0.03   ------------------------------------------------------------------------------------------------------------------ Invalid input(s): POCBNP    Assessment & Plan  Patient is a 50 year old presents with shortness of breath   1. Acute respiratory failure with hypoxia.  Due to combination of pneumonia and acute COPD exasperation 2. Acute on chronic COPD exacerbation continue high-dose Solu-Medrol DuoNeb nebulizer solution and budesonide nebulizers. Continue anabiotic, add mucinex,  3. Right middle lobe pneumonia. No significant improvement  continue Rocephin and Zithromax. Follow-up blood cultures. Order sputum culture. 4. Patient's blood pressure is normally on the lower side 5. 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 On chronic Coumadin and INR is therapeutic. We'll continue to monitor INR 6. History of CVA on Coumadin therapy 7. Anxiety depression continue Lexapro is currently stable    Code Status Orders        Start     Ordered   04/18/15 1603  Full code   Continuous     04/18/15 1602    Code Status History    Date Active Date Inactive Code Status Order ID Comments User Context   This patient has a current code status but no historical code status.           Consultsnone  DVT Prophylaxis  Lovenox Lab Results  Component Value Date   PLT 244 04/19/2015     Time Spent in minutes    Greater than 50% of time spent in care coordination and counseling patient regarding the condition and plan of care.   Auburn Bilberry M.D on 04/19/2015 at 10:37 AM  Between 7am to 6pm - Pager - [phone]  After 6pm go to [email or web address] - password [email or web address] [facility]  [facility]   Office  [phone]

## 2015-04-20 LAB — PROTIME-INR
INR: 3.06
PROTHROMBIN TIME: 31.1 s — AB (ref 11.4–15.0)

## 2015-04-20 MED ORDER — ACETYLCYSTEINE 20 % IN SOLN
4.0000 mL | Freq: Two times a day (BID) | RESPIRATORY_TRACT | Status: DC
  Administered 2015-04-20 – 2015-04-23 (×6): 4 mL via RESPIRATORY_TRACT
  Filled 2015-04-20 (×6): qty 4

## 2015-04-20 NOTE — Progress Notes (Signed)
[facility] [place] [facility]                                                                                                                                                                                            Patient Demographics   Anna Adkins, is a 50 y.o. female, DOB - 04/01/1965, [id number]  Admit date - 04/18/2015   Admitting Physician Alford Highlandichard Wieting, MD  Outpatient Primary MD for the patient is Pricilla HolmSHARPE, LESLIE M, MD   LOS - 2  Subjective: Patient is still short of breath states that cough is not breaking up.     Review of Systems:   CONSTITUTIONAL: No documented fever. No fatigue, weakness. No weight gain, no weight loss.  EYES: No blurry or double vision.  ENT: No tinnitus. No postnasal drip. No redness of the oropharynx.  RESPIRATORY: Positive cough, positive wheeze, no hemoptysis. Positive dyspnea.  CARDIOVASCULAR: No chest pain. No orthopnea. No palpitations. No syncope.  GASTROINTESTINAL: No nausea, no vomiting or diarrhea. No abdominal pain. No melena or hematochezia.  GENITOURINARY: No dysuria or hematuria.  ENDOCRINE: No polyuria or nocturia. No heat or cold intolerance.  HEMATOLOGY: No anemia. No bruising. No bleeding.  INTEGUMENTARY: No rashes. No lesions.  MUSCULOSKELETAL: No arthritis. No swelling. No gout.  NEUROLOGIC: No numbness, tingling, or ataxia. No seizure-type activity.  PSYCHIATRIC: No anxiety. No insomnia. No ADD.    Vitals:   Filed Vitals:   04/20/15 0228 04/20/15 0530 04/20/15 0802 04/20/15 0815  BP:  114/59  110/66  Pulse:  73  80  Temp:  98.2 F (36.8 C)  98.6 F (37 C)  TempSrc:  Oral  Oral  Resp:  20    Height:      Weight:      SpO2: 88% 92% 90%     Wt Readings from Last 3 Encounters:  04/18/15 113.399 kg (250 lb)     Intake/Output Summary (Last 24 hours) at 04/20/15 1130 Last data filed at 04/19/15 1807  Gross per 24 hour  Intake     50 ml  Output    500 ml  Net   -450 ml     Physical Exam:   GENERAL: Pleasant-appearing in no apparent distress.  HEAD, EYES, EARS, NOSE AND THROAT: Atraumatic, normocephalic. Extraocular muscles are intact. Pupils equal and reactive to light. Sclerae anicteric. No conjunctival injection. No oro-pharyngeal erythema.  NECK: Supple. There is no jugular venous distention. No bruits, no lymphadenopathy, no thyromegaly.  HEART: Regular rate and rhythm,. No murmurs, no rubs, no clicks.  LUNGS: Diminished breath sounds today no sensory muscle usage no rhonchi  ABDOMEN: Soft, flat, nontender, nondistended. Has good bowel sounds. No hepatosplenomegaly appreciated.  EXTREMITIES: No evidence of any cyanosis, clubbing, or peripheral edema.  +2 pedal and radial pulses bilaterally.  NEUROLOGIC: The patient is alert, awake, and oriented x3 with no focal motor or sensory deficits appreciated bilaterally.  SKIN: Moist and warm with no rashes appreciated.  Psych: Not anxious, depressed LN: No inguinal LN enlargement    Antibiotics   Anti-infectives    Start     Dose/Rate Route Frequency Ordered Stop   04/19/15 1800  cefTRIAXone (ROCEPHIN) 2 g in dextrose 5 % 50 mL IVPB     2 g 100 mL/hr over 30 Minutes Intravenous Every 24 hours 04/18/15 1603     04/19/15 1600  azithromycin (ZITHROMAX) tablet 250 mg     250 mg Oral Daily 04/18/15 1604     04/18/15 1515  cefTRIAXone (ROCEPHIN) 2 g in dextrose 5 % 50 mL IVPB     2 g 100 mL/hr over 30 Minutes Intravenous  Once 04/18/15 1514 04/18/15 1911   04/18/15 1515  azithromycin (ZITHROMAX) 500 mg in dextrose 5 % 250 mL IVPB     500 mg 250 mL/hr over 60 Minutes Intravenous STAT 04/18/15 1514 04/18/15 1722      Medications   Scheduled Meds: . acetylcysteine  4 mL Nebulization BID  . azithromycin  250 mg Oral Daily  . budesonide (PULMICORT) nebulizer solution  0.25 mg Nebulization BID  . cefTRIAXone (ROCEPHIN)  IV  2 g Intravenous Q24H  . clonazePAM  0.5 mg Oral BID  . escitalopram  10 mg Oral QHS   . guaiFENesin  600 mg Oral BID  . ipratropium-albuterol  3 mL Nebulization Q6H  . loratadine  10 mg Oral Daily  . methylPREDNISolone (SOLU-MEDROL) injection  60 mg Intravenous Q6H  . multivitamin with minerals  1 tablet Oral Daily  . warfarin  4 mg Oral QPM  . Warfarin - Physician Dosing Inpatient   Does not apply q1800   Continuous Infusions:  PRN Meds:.acetaminophen **OR** acetaminophen, sodium chloride   Data Review:   Micro Results Recent Results (from the past 240 hour(s))  Rapid Influenza A&B Antigens ([facility] only)     Status: None   Collection Time: 04/18/15  2:08 PM  Result Value Ref Range Status   Influenza A ([facility]) NEGATIVE NEGATIVE Final   Influenza B ([facility]) NEGATIVE NEGATIVE Final  Culture, blood (Routine X 2) w Reflex to ID Panel     Status: None (Preliminary result)   Collection Time: 04/18/15  3:17 PM  Result Value Ref Range Status   Specimen Description BLOOD LEFT ANTECUBITAL  Final   Special Requests BOTTLES DRAWN AEROBIC AND ANAEROBIC  6CC  Final   Culture NO GROWTH < 24 HOURS  Final   Report Status PENDING  Incomplete  Culture, blood (Routine X 2) w Reflex to ID Panel     Status: None (Preliminary result)   Collection Time: 04/18/15  3:17 PM  Result Value Ref Range Status   Specimen Description BLOOD RIGHT ANTECUBITAL  Final   Special Requests BOTTLES DRAWN AEROBIC AND ANAEROBIC  6CC  Final   Culture NO GROWTH < 24 HOURS  Final   Report Status PENDING  Incomplete    Radiology Reports Dg Chest 2 View  04/18/2015  CLINICAL DATA:  50 year old presenting with acute hypoxia while at her primary provider's office earlier today, oxygen saturation on room air 88%. Recent onset of shortness of breath and productive cough. Patient currently being  treated for sinusitis with antibiotics and prednisone EXAM: CHEST  2 VIEW COMPARISON:  02/18/2012. FINDINGS: Cardiac silhouette upper normal in size, unchanged. Thoracic aorta mildly atherosclerotic, unchanged. Hilar and  mediastinal contours otherwise unremarkable. Linear atelectasis in the left upper lobe. Streaky and patchy airspace opacity in the right middle lobe. Prominent bronchovascular markings diffusely and moderate central peribronchial thickening. No pleural effusions. Pulmonary vascularity normal. Mild degenerative changes involving the thoracic spine with slight exaggeration of the usual thoracic kyphosis. IMPRESSION: Right middle lobe bronchopneumonia superimposed upon moderate changes of acute bronchitis and/or asthma. Linear atelectasis in the left upper lobe. Electronically Signed   By: Hulan Saas M.D.   On: 04/18/2015 14:56     CBC  Recent Labs Lab 04/18/15 1339 04/19/15 0456  WBC 9.8 7.2  HGB 15.4 13.8  HCT 47.2* 42.0  PLT 247 244  MCV 87.6 87.9  MCH 28.5 29.0  MCHC 32.6 32.9  RDW 15.1* 15.5*  LYMPHSABS 2.3  --   MONOABS 0.6  --   EOSABS 0.2  --   BASOSABS 0.0  --     Chemistries   Recent Labs Lab 04/18/15 1339 04/19/15 0456  NA 137 136  K 3.3* 4.1  CL 102 100*  CO2 29 29  GLUCOSE 80 163*  BUN 14 18  CREATININE 0.74 0.82  CALCIUM 9.2 9.0   ------------------------------------------------------------------------------------------------------------------ estimated creatinine clearance is 110.8 mL/min (by C-G formula based on Cr of 0.82). ------------------------------------------------------------------------------------------------------------------ No results for input(s): HGBA1C in the last 72 hours. ------------------------------------------------------------------------------------------------------------------ No results for input(s): CHOL, HDL, LDLCALC, TRIG, CHOLHDL, LDLDIRECT in the last 72 hours. ------------------------------------------------------------------------------------------------------------------ No results for input(s): TSH, T4TOTAL, T3FREE, THYROIDAB in the last 72 hours.  Invalid input(s):  FREET3 ------------------------------------------------------------------------------------------------------------------ No results for input(s): VITAMINB12, FOLATE, FERRITIN, TIBC, IRON, RETICCTPCT in the last 72 hours.  Coagulation profile  Recent Labs Lab 04/18/15 1339 04/19/15 1327 04/20/15 0442  INR 2.50 2.59 3.06    No results for input(s): DDIMER in the last 72 hours.  Cardiac Enzymes  Recent Labs Lab 04/18/15 1339  TROPONINI <0.03   ------------------------------------------------------------------------------------------------------------------ Invalid input(s): POCBNP    Assessment & Plan  Patient is a 50 year old presents with shortness of breath   1. Acute respiratory failure with hypoxia. Slow to improve Due to combination of pneumonia and acute COPD exasperation 2. Acute on chronic COPD exacerbation continue high-dose Solu-Medrol DuoNeb nebulizer solution and budesonide nebulizers. Continue anabiotic, Mucinex, we'll try Mucomyst to help mobilize her secretions 3. Right middle lobe pneumonia. Some improvement continue Rocephin and Zithromax. Follow-up blood cultures. Order sputum culture. 4. Patient's blood pressure currently controlled 5. 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 On chronic Coumadin and INR is therapeutic. His INR being closely monitored  6. History of CVA on Coumadin therapy 7. Anxiety depression continue Lexapro is currently stable    Code Status Orders        Start     Ordered   04/18/15 1603  Full code   Continuous     04/18/15 1602    Code Status History    Date Active Date Inactive Code Status Order ID Comments User Context   This patient has a current code status but no historical code status.           Consultsnone  DVT Prophylaxis  Lovenox Lab Results  Component Value Date   PLT 244 04/19/2015     Time Spent in minutes   Greater than 50% of time  spent in care coordination and  counseling patient regarding the condition and plan of care.   Auburn Bilberry M.D on 04/20/2015 at 11:30 AM  Between 7am to 6pm - Pager - [phone]  After 6pm go to [email or web address] - password [email or web address] [facility]  [facility] Hospital[facility]   Office  [phone]

## 2015-04-20 NOTE — Progress Notes (Signed)
Patient is hemodynamically stable,iv antibiotics continue,oxygen titrated down to 2.5 liters with o2 sats 90 to 91,denies distress,up on chair,iv steriods continues.

## 2015-04-21 ENCOUNTER — Inpatient Hospital Stay: Payer: Medicaid Other

## 2015-04-21 ENCOUNTER — Encounter: Payer: Self-pay | Admitting: Radiology

## 2015-04-21 LAB — CBC
HEMATOCRIT: 40.8 % (ref 35.0–47.0)
HEMOGLOBIN: 13.4 g/dL (ref 12.0–16.0)
MCH: 28.4 pg (ref 26.0–34.0)
MCHC: 32.9 g/dL (ref 32.0–36.0)
MCV: 86.3 fL (ref 80.0–100.0)
Platelets: 245 10*3/uL (ref 150–440)
RBC: 4.73 MIL/uL (ref 3.80–5.20)
RDW: 15.6 % — ABNORMAL HIGH (ref 11.5–14.5)
WBC: 11.7 10*3/uL — ABNORMAL HIGH (ref 3.6–11.0)

## 2015-04-21 LAB — PROTIME-INR
INR: 3.52
Prothrombin Time: 34.5 seconds — ABNORMAL HIGH (ref 11.4–15.0)

## 2015-04-21 MED ORDER — IOPAMIDOL (ISOVUE-300) INJECTION 61%
75.0000 mL | Freq: Once | INTRAVENOUS | Status: AC | PRN
  Administered 2015-04-21: 75 mL via INTRAVENOUS

## 2015-04-21 NOTE — Progress Notes (Signed)
[facility] - [place] at [facility]                                                                                                                                                                                            Patient Demographics   Anna Adkins, is a 50 y.o. female, DOB - 10-10-1965, UJW:[id number]  Admit date - 04/18/2015   Admitting Physician Alford Highland, MD  Outpatient Primary MD for the patient is SHARPE, Dewitt Rota, MD   LOS - 3  Subjective:  Patient oxygen drops even with 4L, not coughing much  Review of Systems:   CONSTITUTIONAL: No documented fever. No fatigue, weakness. No weight gain, no weight loss.  EYES: No blurry or double vision.  ENT: No tinnitus. No postnasal drip. No redness of the oropharynx.  RESPIRATORY: Positive cough, positive wheeze, no hemoptysis. Positive dyspnea.  CARDIOVASCULAR: No chest pain. No orthopnea. No palpitations. No syncope.  GASTROINTESTINAL: No nausea, no vomiting or diarrhea. No abdominal pain. No melena or hematochezia.  GENITOURINARY: No dysuria or hematuria.  ENDOCRINE: No polyuria or nocturia. No heat or cold intolerance.  HEMATOLOGY: No anemia. No bruising. No bleeding.  INTEGUMENTARY: No rashes. No lesions.  MUSCULOSKELETAL: No arthritis. No swelling. No gout.  NEUROLOGIC: No numbness, tingling, or ataxia. No seizure-type activity.  PSYCHIATRIC: No anxiety. No insomnia. No ADD.    Vitals:   Filed Vitals:   04/21/15 0935 04/21/15 0939 04/21/15 0943 04/21/15 0946  BP:    111/65  Pulse:   81   Temp:   98.3 F (36.8 C)   TempSrc:   Oral   Resp:   20   Height:      Weight:      SpO2: 79% 85% 89% 91%    Wt Readings from Last 3 Encounters:  04/18/15 113.399 kg (250 lb)     Intake/Output Summary (Last 24 hours) at 04/21/15 1301 Last data filed at 04/20/15 1704  Gross per 24 hour  Intake    290 ml  Output      0 ml  Net    290 ml    Physical Exam:   GENERAL: Pleasant-appearing in  no apparent distress.  HEAD, EYES, EARS, NOSE AND THROAT: Atraumatic, normocephalic. Extraocular muscles are intact. Pupils equal and reactive to light. Sclerae anicteric. No conjunctival injection. No oro-pharyngeal erythema.  NECK: Supple. There is no jugular venous distention. No bruits, no lymphadenopathy, no thyromegaly.  HEART: Regular rate and rhythm,. No murmurs, no rubs, no clicks.  LUNGS: Diminished breath sounds today no sensory muscle usage no rhonchi ABDOMEN: Soft, flat, nontender, nondistended. Has good  bowel sounds. No hepatosplenomegaly appreciated.  EXTREMITIES: No evidence of any cyanosis, clubbing, or peripheral edema.  +2 pedal and radial pulses bilaterally.  NEUROLOGIC: The patient is alert, awake, and oriented x3 with no focal motor or sensory deficits appreciated bilaterally.  SKIN: Moist and warm with no rashes appreciated.  Psych: Not anxious, depressed LN: No inguinal LN enlargement    Antibiotics   Anti-infectives    Start     Dose/Rate Route Frequency Ordered Stop   04/19/15 1800  cefTRIAXone (ROCEPHIN) 2 g in dextrose 5 % 50 mL IVPB     2 g 100 mL/hr over 30 Minutes Intravenous Every 24 hours 04/18/15 1603     04/19/15 1600  azithromycin (ZITHROMAX) tablet 250 mg     250 mg Oral Daily 04/18/15 1604     04/18/15 1515  cefTRIAXone (ROCEPHIN) 2 g in dextrose 5 % 50 mL IVPB     2 g 100 mL/hr over 30 Minutes Intravenous  Once 04/18/15 1514 04/18/15 1911   04/18/15 1515  azithromycin (ZITHROMAX) 500 mg in dextrose 5 % 250 mL IVPB     500 mg 250 mL/hr over 60 Minutes Intravenous STAT 04/18/15 1514 04/18/15 1722      Medications   Scheduled Meds: . acetylcysteine  4 mL Nebulization BID  . azithromycin  250 mg Oral Daily  . budesonide (PULMICORT) nebulizer solution  0.25 mg Nebulization BID  . cefTRIAXone (ROCEPHIN)  IV  2 g Intravenous Q24H  . clonazePAM  0.5 mg Oral BID  . escitalopram  10 mg Oral QHS  . guaiFENesin  600 mg Oral BID  .  ipratropium-albuterol  3 mL Nebulization Q6H  . loratadine  10 mg Oral Daily  . methylPREDNISolone (SOLU-MEDROL) injection  60 mg Intravenous Q6H  . multivitamin with minerals  1 tablet Oral Daily  . warfarin  4 mg Oral QPM  . Warfarin - Physician Dosing Inpatient   Does not apply q1800   Continuous Infusions:  PRN Meds:.acetaminophen **OR** acetaminophen, sodium chloride   Data Review:   Micro Results Recent Results (from the past 240 hour(s))  Rapid Influenza A&B Antigens ([facility] only)     Status: None   Collection Time: 04/18/15  2:08 PM  Result Value Ref Range Status   Influenza A ([facility]) NEGATIVE NEGATIVE Final   Influenza B ([facility]) NEGATIVE NEGATIVE Final  Culture, blood (Routine X 2) w Reflex to ID Panel     Status: None (Preliminary result)   Collection Time: 04/18/15  3:17 PM  Result Value Ref Range Status   Specimen Description BLOOD LEFT ANTECUBITAL  Final   Special Requests BOTTLES DRAWN AEROBIC AND ANAEROBIC  6CC  Final   Culture NO GROWTH 3 DAYS  Final   Report Status PENDING  Incomplete  Culture, blood (Routine X 2) w Reflex to ID Panel     Status: None (Preliminary result)   Collection Time: 04/18/15  3:17 PM  Result Value Ref Range Status   Specimen Description BLOOD RIGHT ANTECUBITAL  Final   Special Requests BOTTLES DRAWN AEROBIC AND ANAEROBIC  6CC  Final   Culture NO GROWTH 3 DAYS  Final   Report Status PENDING  Incomplete    Radiology Reports Dg Chest 2 View  04/18/2015  CLINICAL DATA:  50 year old presenting with acute hypoxia while at her primary provider's office earlier today, oxygen saturation on room air 88%. Recent onset of shortness of breath and productive cough. Patient currently being treated for sinusitis with antibiotics and prednisone EXAM: CHEST  2 VIEW COMPARISON:  02/18/2012. FINDINGS: Cardiac silhouette upper normal in size, unchanged. Thoracic aorta mildly atherosclerotic, unchanged. Hilar and mediastinal contours otherwise unremarkable. Linear  atelectasis in the left upper lobe. Streaky and patchy airspace opacity in the right middle lobe. Prominent bronchovascular markings diffusely and moderate central peribronchial thickening. No pleural effusions. Pulmonary vascularity normal. Mild degenerative changes involving the thoracic spine with slight exaggeration of the usual thoracic kyphosis. IMPRESSION: Right middle lobe bronchopneumonia superimposed upon moderate changes of acute bronchitis and/or asthma. Linear atelectasis in the left upper lobe. Electronically Signed   By: Hulan Saas M.D.   On: 04/18/2015 14:56     CBC  Recent Labs Lab 04/18/15 1339 04/19/15 0456 04/21/15 0630  WBC 9.8 7.2 11.7*  HGB 15.4 13.8 13.4  HCT 47.2* 42.0 40.8  PLT 247 244 245  MCV 87.6 87.9 86.3  MCH 28.5 29.0 28.4  MCHC 32.6 32.9 32.9  RDW 15.1* 15.5* 15.6*  LYMPHSABS 2.3  --   --   MONOABS 0.6  --   --   EOSABS 0.2  --   --   BASOSABS 0.0  --   --     Chemistries   Recent Labs Lab 04/18/15 1339 04/19/15 0456  NA 137 136  K 3.3* 4.1  CL 102 100*  CO2 29 29  GLUCOSE 80 163*  BUN 14 18  CREATININE 0.74 0.82  CALCIUM 9.2 9.0   ------------------------------------------------------------------------------------------------------------------ estimated creatinine clearance is 110.8 mL/min (by C-G formula based on Cr of 0.82). ------------------------------------------------------------------------------------------------------------------ No results for input(s): HGBA1C in the last 72 hours. ------------------------------------------------------------------------------------------------------------------ No results for input(s): CHOL, HDL, LDLCALC, TRIG, CHOLHDL, LDLDIRECT in the last 72 hours. ------------------------------------------------------------------------------------------------------------------ No results for input(s): TSH, T4TOTAL, T3FREE, THYROIDAB in the last 72 hours.  Invalid input(s):  FREET3 ------------------------------------------------------------------------------------------------------------------ No results for input(s): VITAMINB12, FOLATE, FERRITIN, TIBC, IRON, RETICCTPCT in the last 72 hours.  Coagulation profile  Recent Labs Lab 04/18/15 1339 04/19/15 1327 04/20/15 0442 04/21/15 0630  INR 2.50 2.59 3.06 3.52    No results for input(s): DDIMER in the last 72 hours.  Cardiac Enzymes  Recent Labs Lab 04/18/15 1339  TROPONINI <0.03   ------------------------------------------------------------------------------------------------------------------ Invalid input(s): POCBNP    Assessment & Plan  Patient is a 50 year old presents with shortness of breath   1. Acute respiratory failure with hypoxia. Slow to improve, still hypoic, check CT of chest to further evaluation, ask pulmonary to see Due to combination of pneumonia and acute COPD exasperation 2. Acute on chronic COPD exacerbation continue high-dose Solu-Medrol DuoNeb nebulizer solution and budesonide nebulizers. Continue anabiotic, Mucinex,  Mucomyst not helping 3. Right middle lobe pneumonia.continue antibiotic CT of chest pending 4. Patient's blood pressure currently controlled 5. 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 On chronic Coumadin and INR is therapeutic. Continue to monitor INR 6. History of CVA on Coumadin therapy 7. Anxiety depression continue Lexapro is currently stable    Code Status Orders        Start     Ordered   04/18/15 1603  Full code   Continuous     04/18/15 1602    Code Status History    Date Active Date Inactive Code Status Order ID Comments User Context   This patient has a current code status but no historical code status.           Consultsnone  DVT Prophylaxis  Lovenox Lab Results  Component Value Date   PLT 245 04/21/2015  Time Spent in minutes  32min  Greater than 50% of time spent in care coordination  and counseling patient regarding the condition and plan of care.   Auburn BilberryPATEL, Nephtali Docken M.D on 04/21/2015 at 1:01 PM  Between 7am to 6pm - Pager - [phone]  After 6pm go to [email or web address] - password [email or web address] [facility]  [facility]   Office  [phone]

## 2015-04-21 NOTE — Progress Notes (Signed)
ANTICOAGULATION CONSULT NOTE - Initial Consult  Pharmacy Consult for warfarin Indication: DVT/PE  Allergies  Allergen Reactions  . Sulfa Antibiotics Anaphylaxis    Patient Measurements: Height: 5\' 10"  (177.8 cm) Weight: 250 lb (113.399 kg) IBW/kg (Calculated) : 68.5 Heparin Dosing Weight:   Vital Signs: Temp: 98.3 F (36.8 C) (04/10 0943) Temp Source: Oral (04/10 0943) BP: 111/65 mmHg (04/10 0946) Pulse Rate: 81 (04/10 0943)  Labs:  Recent Labs  04/19/15 0456 04/19/15 1327 04/20/15 0442 04/21/15 0630  HGB 13.8  --   --  13.4  HCT 42.0  --   --  40.8  PLT 244  --   --  245  LABPROT  --  27.4* 31.1* 34.5*  INR  --  2.59 3.06 3.52  CREATININE 0.82  --   --   --     Estimated Creatinine Clearance: 110.8 mL/min (by C-G formula based on Cr of 0.82).   Medical History: Past Medical History  Diagnosis Date  . Arthritis   . Pulmonary emboli (HCC)   . DVT (deep venous thrombosis) (HCC)   . Depression   . Anxiety     Medications:    Assessment: Pharmacy consulted to dose and monitor warfarin therapy in this 50 year old female being treated with warfarin therapy.   Home dose: warfarin 4 mg PO daily   4/8: INR: 2.59; 4 mg  4/9 INR: 3.06; 4 mg  4/10 INR 3.52;    Goal of Therapy:  INR: 2-3  Plan:  Will hold tonight's dose of warfarin and reassess PT/INR with am labs.    Anna Adkins 04/21/2015,2:38 PM

## 2015-04-21 NOTE — Progress Notes (Signed)
Report received from Caroline RN at 2310. This RN will take over patient care for the rest of the shift. 

## 2015-04-22 LAB — CBC
HEMATOCRIT: 43 % (ref 35.0–47.0)
HEMOGLOBIN: 14.2 g/dL (ref 12.0–16.0)
MCH: 29.2 pg (ref 26.0–34.0)
MCHC: 33.1 g/dL (ref 32.0–36.0)
MCV: 88 fL (ref 80.0–100.0)
Platelets: 258 10*3/uL (ref 150–440)
RBC: 4.88 MIL/uL (ref 3.80–5.20)
RDW: 15.1 % — ABNORMAL HIGH (ref 11.5–14.5)
WBC: 9.9 10*3/uL (ref 3.6–11.0)

## 2015-04-22 LAB — PROTIME-INR
INR: 3.65
PROTHROMBIN TIME: 35.5 s — AB (ref 11.4–15.0)

## 2015-04-22 MED ORDER — METHYLPREDNISOLONE SODIUM SUCC 125 MG IJ SOLR
60.0000 mg | Freq: Two times a day (BID) | INTRAMUSCULAR | Status: DC
  Administered 2015-04-22 – 2015-04-24 (×4): 60 mg via INTRAVENOUS
  Filled 2015-04-22 (×4): qty 2

## 2015-04-22 NOTE — Progress Notes (Signed)
ANTICOAGULATION CONSULT NOTE - FOLLOW UP Consult  Pharmacy Consult for warfarin Indication: DVT/PE  Allergies  Allergen Reactions  . Sulfa Antibiotics Anaphylaxis    Patient Measurements: Height: 5\' 10"  (177.8 cm) Weight: 250 lb (113.399 kg) IBW/kg (Calculated) : 68.5 Heparin Dosing Weight:   Vital Signs: Temp: 98.4 F (36.9 C) (04/11 0752) Temp Source: Oral (04/11 0752) BP: 108/60 mmHg (04/11 0837) Pulse Rate: 71 (04/11 0837)  Labs:  Recent Labs  04/20/15 0442 04/21/15 0630 04/22/15 0519  HGB  --  13.4 14.2  HCT  --  40.8 43.0  PLT  --  245 258  LABPROT 31.1* 34.5* 35.5*  INR 3.06 3.52 3.65    Estimated Creatinine Clearance: 110.8 mL/min (by C-G formula based on Cr of 0.82).   Medical History: Past Medical History  Diagnosis Date  . Arthritis   . Pulmonary emboli (HCC)   . DVT (deep venous thrombosis) (HCC)   . Depression   . Anxiety     Medications:    Assessment: Pharmacy consulted to dose and monitor warfarin therapy in this 50 year old female being treated with warfarin therapy.   Home dose: warfarin 4 mg PO daily   4/8: INR: 2.59; 4 mg  4/9 INR: 3.06; 4 mg  4/10 INR 3.52; HELD  4/11: INR: 3.65   Goal of Therapy:  INR: 2-3  Plan:  Will hold tonight's dose of warfarin and reassess PT/INR with am labs.    Kristofer Schaffert D 04/22/2015,9:22 AM

## 2015-04-22 NOTE — Care Management (Signed)
[facility] with the diagnosis of acute respiratory failure. Lives with her 52 year old twins. Mother is Anna ButteryLinda Adkins [phone]). Last seen Dr. Cedric FishmanSharpe on Friday. No home Health. No skilled facility. No home oxygen. Uses a cane/rolling walker to aid in ambulation. Also has a shower chair and shower chair in the home. Takes care of all basic activities of daily living herself, does drive. [employer] helps with errands. Applied for disability. No Life Alert. [facility].  Requested nebulizer for home. Mother will transport.  Anna GreetBrenda S Lylith Bebeau RN MSN CCM Care Management [phone]

## 2015-04-22 NOTE — Progress Notes (Signed)
[facility] [place] at [facility]                                                                                                                                                                                            Patient Demographics   Anna Adkins, is a 50 y.o. female, DOB - 06-16-1965, ZOX:[id number]  Admit date - 04/18/2015   Admitting Physician Alford Highland, MD  Outpatient Primary MD for the patient is [facility], Dewitt Rota, MD   LOS - 4  Subjective:  Patient feeling better redness of breath little improved but still on 4 L CT of the chest reviewed  Review of Systems:   CONSTITUTIONAL: No documented fever. No fatigue, weakness. No weight gain, no weight loss.  EYES: No blurry or double vision.  ENT: No tinnitus. No postnasal drip. No redness of the oropharynx.  RESPIRATORY: Positive cough, positive wheeze, no hemoptysis. Positive dyspnea.  CARDIOVASCULAR: No chest pain. No orthopnea. No palpitations. No syncope.  GASTROINTESTINAL: No nausea, no vomiting or diarrhea. No abdominal pain. No melena or hematochezia.  GENITOURINARY: No dysuria or hematuria.  ENDOCRINE: No polyuria or nocturia. No heat or cold intolerance.  HEMATOLOGY: No anemia. No bruising. No bleeding.  INTEGUMENTARY: No rashes. No lesions.  MUSCULOSKELETAL: No arthritis. No swelling. No gout.  NEUROLOGIC: No numbness, tingling, or ataxia. No seizure-type activity.  PSYCHIATRIC: No anxiety. No insomnia. No ADD.    Vitals:   Filed Vitals:   04/22/15 0443 04/22/15 0450 04/22/15 0752 04/22/15 0837  BP: 121/55  111/64 108/60  Pulse: 56 72 66 71  Temp: 98.3 F (36.8 C)  98.4 F (36.9 C)   TempSrc: Oral  Oral   Resp: Height:      Weight:      SpO2: 96%  95% 92%    Wt Readings from Last 3 Encounters:  04/18/15 113.399 kg (250 lb)     Intake/Output Summary (Last 24 hours) at 04/22/15 1149 Last data filed at 04/22/15 0900  Gross per 24 hour  Intake    290 ml   Output   1250 ml  Net   -960 ml    Physical Exam:   GENERAL: Pleasant-appearing in no apparent distress.  HEAD, EYES, EARS, NOSE AND THROAT: Atraumatic, normocephalic. Extraocular muscles are intact. Pupils equal and reactive to light. Sclerae anicteric. No conjunctival injection. No oro-pharyngeal erythema.  NECK: Supple. There is no jugular venous distention. No bruits, no lymphadenopathy, no thyromegaly.  HEART: Regular rate and rhythm,. No murmurs, no rubs, no clicks.  LUNGS: Bilateral bases rhonchi no associated muscle usage ABDOMEN: Soft,  flat, nontender, nondistended. Has good bowel sounds. No hepatosplenomegaly appreciated.  EXTREMITIES: No evidence of any cyanosis, clubbing, or peripheral edema.  +2 pedal and radial pulses bilaterally.  NEUROLOGIC: The patient is alert, awake, and oriented x3 with no focal motor or sensory deficits appreciated bilaterally.  SKIN: Moist and warm with no rashes appreciated.  Psych: Not anxious, depressed LN: No inguinal LN enlargement    Antibiotics   Anti-infectives    Start     Dose/Rate Route Frequency Ordered Stop   04/19/15 1800  cefTRIAXone (ROCEPHIN) 2 g in dextrose 5 % 50 mL IVPB     2 g 100 mL/hr over 30 Minutes Intravenous Every 24 hours 04/18/15 1603     04/19/15 1600  azithromycin (ZITHROMAX) tablet 250 mg     250 mg Oral Daily 04/18/15 1604     04/18/15 1515  cefTRIAXone (ROCEPHIN) 2 g in dextrose 5 % 50 mL IVPB     2 g 100 mL/hr over 30 Minutes Intravenous  Once 04/18/15 1514 04/18/15 1911   04/18/15 1515  azithromycin (ZITHROMAX) 500 mg in dextrose 5 % 250 mL IVPB     500 mg 250 mL/hr over 60 Minutes Intravenous STAT 04/18/15 1514 04/18/15 1722      Medications   Scheduled Meds: . acetylcysteine  4 mL Nebulization BID  . azithromycin  250 mg Oral Daily  . budesonide (PULMICORT) nebulizer solution  0.25 mg Nebulization BID  . cefTRIAXone (ROCEPHIN)  IV  2 g Intravenous Q24H  . clonazePAM  0.5 mg Oral BID  .  escitalopram  10 mg Oral QHS  . guaiFENesin  600 mg Oral BID  . ipratropium-albuterol  3 mL Nebulization Q6H  . loratadine  10 mg Oral Daily  . methylPREDNISolone (SOLU-MEDROL) injection  60 mg Intravenous Q6H  . multivitamin with minerals  1 tablet Oral Daily   Continuous Infusions:  PRN Meds:.acetaminophen **OR** acetaminophen, sodium chloride   Data Review:   Micro Results Recent Results (from the past 240 hour(s))  Rapid Influenza A&B Antigens ([facility] only)     Status: None   Collection Time: 04/18/15  2:08 PM  Result Value Ref Range Status   Influenza A ([facility]) NEGATIVE NEGATIVE Final   Influenza B ([facility]) NEGATIVE NEGATIVE Final  Culture, blood (Routine X 2) w Reflex to ID Panel     Status: None (Preliminary result)   Collection Time: 04/18/15  3:17 PM  Result Value Ref Range Status   Specimen Description BLOOD LEFT ANTECUBITAL  Final   Special Requests BOTTLES DRAWN AEROBIC AND ANAEROBIC  6CC  Final   Culture NO GROWTH 3 DAYS  Final   Report Status PENDING  Incomplete  Culture, blood (Routine X 2) w Reflex to ID Panel     Status: None (Preliminary result)   Collection Time: 04/18/15  3:17 PM  Result Value Ref Range Status   Specimen Description BLOOD RIGHT ANTECUBITAL  Final   Special Requests BOTTLES DRAWN AEROBIC AND ANAEROBIC  6CC  Final   Culture NO GROWTH 3 DAYS  Final   Report Status PENDING  Incomplete    Radiology Reports Dg Chest 2 View  04/18/2015  CLINICAL DATA:  50 year old presenting with acute hypoxia while at her primary provider's office earlier today, oxygen saturation on room air 88%. Recent onset of shortness of breath and productive cough. Patient currently being treated for sinusitis with antibiotics and prednisone EXAM: CHEST  2 VIEW COMPARISON:  02/18/2012. FINDINGS: Cardiac silhouette upper normal in size, unchanged. Thoracic aorta  mildly atherosclerotic, unchanged. Hilar and mediastinal contours otherwise unremarkable. Linear atelectasis in the left  upper lobe. Streaky and patchy airspace opacity in the right middle lobe. Prominent bronchovascular markings diffusely and moderate central peribronchial thickening. No pleural effusions. Pulmonary vascularity normal. Mild degenerative changes involving the thoracic spine with slight exaggeration of the usual thoracic kyphosis. IMPRESSION: Right middle lobe bronchopneumonia superimposed upon moderate changes of acute bronchitis and/or asthma. Linear atelectasis in the left upper lobe. Electronically Signed   By: Hulan Saas M.D.   On: 04/18/2015 14:56   Ct Chest W Contrast  04/21/2015  CLINICAL DATA:  Patient diagnosed and admitted to the hospital with pneumonia. Not improving. Patient reports chest tightness, feeling exhausted and coughing with some shortness of breath. EXAM: CT CHEST WITH CONTRAST TECHNIQUE: Multidetector CT imaging of the chest was performed during intravenous contrast administration. CONTRAST:  75mL ISOVUE-300 IOPAMIDOL (ISOVUE-300) INJECTION 61% COMPARISON:  Chest radiograph, 04/18/2015 FINDINGS: Neck base and axilla:  No mass or adenopathy.  Normal thyroid. Mediastinum and hila: Heart normal in size and configuration. No coronary artery calcifications. Great vessels are normal in caliber. No mediastinal or hilar masses or enlarged lymph nodes. Lungs and pleural: There is lower lobe, right middle lobe and left upper lobe lingula bronchial wall thickening with peribronchovascular patchy and linear opacities. Findings may be due to a combination of reactive airways disease and atelectasis. Acute bronchitis and possibly bronchopneumonia is possible. The remainder of the lungs is clear. No mass or suspicious nodule. No pleural effusion or pneumothorax. Limited upper abdomen:  Unremarkable. Musculoskeletal: Minor endplate spurring along the thoracic spine. Otherwise unremarkable. IMPRESSION: 1. Lower lung bronchial wall thickening with peribronchovascular opacities. This may be due to  reactive airway disease and atelectasis. Acute bronchitis or possibly bronchopneumonia is possible. No other acute abnormality. No pulmonary edema. Electronically Signed   By: Amie Portland M.D.   On: 04/21/2015 14:12     CBC  Recent Labs Lab 04/18/15 1339 04/19/15 0456 04/21/15 0630 04/22/15 0519  WBC 9.8 7.2 11.7* 9.9  HGB 15.4 13.8 13.4 14.2  HCT 47.2* 42.0 40.8 43.0  PLT 247 244 245 258  MCV 87.6 87.9 86.3 88.0  MCH 28.5 29.0 28.4 29.2  MCHC 32.6 32.9 32.9 33.1  RDW 15.1* 15.5* 15.6* 15.1*  LYMPHSABS 2.3  --   --   --   MONOABS 0.6  --   --   --   EOSABS 0.2  --   --   --   BASOSABS 0.0  --   --   --     Chemistries   Recent Labs Lab 04/18/15 1339 04/19/15 0456  NA 137 136  K 3.3* 4.1  CL 102 100*  CO2 29 29  GLUCOSE 80 163*  BUN 14 18  CREATININE 0.74 0.82  CALCIUM 9.2 9.0   ------------------------------------------------------------------------------------------------------------------ estimated creatinine clearance is 110.8 mL/min (by C-G formula based on Cr of 0.82). ------------------------------------------------------------------------------------------------------------------ No results for input(s): HGBA1C in the last 72 hours. ------------------------------------------------------------------------------------------------------------------ No results for input(s): CHOL, HDL, LDLCALC, TRIG, CHOLHDL, LDLDIRECT in the last 72 hours. ------------------------------------------------------------------------------------------------------------------ No results for input(s): TSH, T4TOTAL, T3FREE, THYROIDAB in the last 72 hours.  Invalid input(s): FREET3 ------------------------------------------------------------------------------------------------------------------ No results for input(s): VITAMINB12, FOLATE, FERRITIN, TIBC, IRON, RETICCTPCT in the last 72 hours.  Coagulation profile  Recent Labs Lab 04/18/15 1339 04/19/15 1327 04/20/15 0442  04/21/15 0630 04/22/15 0519  INR 2.50 2.59 3.06 3.52 3.65    No results for input(s): DDIMER in the last 72 hours.  Cardiac Enzymes  Recent Labs Lab 04/18/15 1339  TROPONINI <0.03   ------------------------------------------------------------------------------------------------------------------ Invalid input(s): POCBNP    Assessment & Plan  Patient is a 50 year old presents with shortness of breath   1. Acute respiratory failure with hypoxia.  CT of the chest consistent with bilateral bronchitis and possible pneumonia. Continue mucolytics, antibiotics I'll asked pulmonary to see since slow to improve Due to combination of pneumonia and acute COPD exasperation 2. Acute on chronic COPD exacerbation continue change steroids to every 12 hours nebulizer solution and budesonide nebulizers. Continue anabiotic, Mucinex,  Mucomyst not helping 3. Bilateral pneumonia Continue azithromycin and ceftriaxone 4. Hypertension blood pressure stable 5. 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 On chronic Coumadin and INR is therapeutic. Continue to monitor INR and Coumadin on hold 6. History of CVA on Coumadin therapy 7. Anxiety depression continue Lexapro is currently stable    Code Status Orders        Start     Ordered   04/18/15 1603  Full code   Continuous     04/18/15 1602    Code Status History    Date Active Date Inactive Code Status Order ID Comments User Context   This patient has a current code status but no historical code status.           Consultsnone  DVT Prophylaxis  Lovenox Lab Results  Component Value Date   PLT 258 04/22/2015     Time Spent in minutes   Greater than 50% of time spent in care coordination and counseling patient regarding the condition and plan of care.   Auburn Bilberry M.D on 04/22/2015 at 11:49 AM  Between 7am to 6pm - Pager - [phone]  After 6pm go to [email or web address] - password [email or web address]  [facility]  [facility]   Office  [phone]

## 2015-04-23 LAB — PROTIME-INR
INR: 2.96
Prothrombin Time: 30.3 seconds — ABNORMAL HIGH (ref 11.4–15.0)

## 2015-04-23 MED ORDER — ACETYLCYSTEINE 20 % IN SOLN
4.0000 mL | Freq: Two times a day (BID) | RESPIRATORY_TRACT | Status: DC
  Administered 2015-04-23 – 2015-04-24 (×2): 4 mL via RESPIRATORY_TRACT
  Filled 2015-04-23 (×2): qty 4

## 2015-04-23 MED ORDER — ONDANSETRON HCL 4 MG PO TABS: 4.0000 mg | ORAL_TABLET | Freq: Three times a day (TID) | ORAL | Status: DC | PRN

## 2015-04-23 MED ORDER — WARFARIN - PHARMACIST DOSING INPATIENT
Freq: Every day | Status: DC
  Administered 2015-04-23: 18:00:00

## 2015-04-23 MED ORDER — WARFARIN SODIUM 5 MG PO TABS
2.5000 mg | ORAL_TABLET | Freq: Once | ORAL | Status: AC
  Administered 2015-04-23: 18:00:00 2.5 mg via ORAL
  Filled 2015-04-23: qty 1

## 2015-04-23 NOTE — Progress Notes (Signed)
ANTICOAGULATION CONSULT NOTE - FOLLOW UP Consult  Pharmacy Consult for warfarin Indication: DVT/PE  Allergies  Allergen Reactions  . Sulfa Antibiotics Anaphylaxis    Patient Measurements: Height: 5\' 10"  (177.8 cm) Weight: 250 lb (113.399 kg) IBW/kg (Calculated) : 68.5 Heparin Dosing Weight:   Vital Signs: Temp: 98.3 F (36.8 C) (04/12 0441) Temp Source: Oral (04/12 0441) BP: 100/58 mmHg (04/12 0908) Pulse Rate: 66 (04/12 0907)  Labs:  Recent Labs  04/21/15 0630 04/22/15 0519 04/23/15 0507  HGB 13.4 14.2  --   HCT 40.8 43.0  --   PLT 245 258  --   LABPROT 34.5* 35.5* 30.3*  INR 3.52 3.65 2.96    Estimated Creatinine Clearance: 110.8 mL/min (by C-G formula based on Cr of 0.82).   Medical History: Past Medical History  Diagnosis Date  . Arthritis   . Pulmonary emboli (HCC)   . DVT (deep venous thrombosis) (HCC)   . Depression   . Anxiety     Medications:    Assessment: Pharmacy consulted to dose and monitor warfarin therapy in this 50 year old female being treated with warfarin therapy.   Home dose: warfarin 4 mg PO daily   4/8: INR: 2.59; 4 mg  4/9 INR: 3.06; 4 mg  4/10 INR 3.52; HELD  4/11: INR: 3.65; held 4/12: INR: 2.96    Goal of Therapy:  INR: 2-3  Plan:  INR is therapeutic; however very close to upper range of goal. Will give warfarin 2.5 mg PO x 1 and will then recheck PT/INR with am labs.    Anna Adkins D 04/23/2015,10:03 AM

## 2015-04-23 NOTE — Progress Notes (Addendum)
Date: 04/23/2015,   MRN# [id number] CELSEY ASSELIN Apr 11, 1965 Code Status:     Code Status Orders        Start     Ordered   04/18/15 1603  Full code   Continuous     04/18/15 1602    Code Status History    Date Active Date Inactive Code Status Order ID Comments User Context   This patient has a current code status but no historical code status.     Hosp day:@LENGTHOFSTAYDAYS @ Referring MD: @         AdmissionWeight: 250 lb (113.399 kg)                 CurrentWeight: 250 lb (113.399 kg)  CC: shortness of breath  HPI: This is a 50 year old lady who came in here with shortness of breath, low sats, wheezing, RML Pneumonia. She was admitted. Her response was slow. Fio2 down to 2 liters, sob is much improved, ambulating, no chest pain, pleurisy, leg edema, calf pain, hemoptysis, ectopy or syncope. Chest noted below. She is on chronic warfarin for  Massive DVT.pe 2014.   PMHX:   Past Medical History  Diagnosis Date  . Arthritis   . Pulmonary emboli (HCC)   . DVT (deep venous thrombosis) (HCC)   . Depression   . Anxiety    Surgical Hx:  Past Surgical History  Procedure Laterality Date  . Tubal ligation    . Hernia repair    . Blood clot removal    . Skin graft     Family Hx:  Family History  Problem Relation Age of Onset  . Healthy Mother   . Heart disease Father   . Diabetes Father   . Asthma Father    Social Hx:   Social History  Substance Use Topics  . Smoking status: Former Games developer  . Smokeless tobacco: None  . Alcohol Use: No   Medication:    Home Medication:  No current outpatient prescriptions on file.  Current Medication: @   Allergies:  Sulfa antibiotics  Review of Systems: Gen:  Denies  fever, sweats, chills HEENT: Denies blurred vision, double vision, ear pain, eye pain, hearing loss, nose bleeds, sore throat Cvc:  No dizziness, chest pain or heaviness Resp: less sob, occasional wheezes, no hemoptysis    Gi: Denies  swallowing difficulty, stomach pain, nausea or vomiting, diarrhea, constipation, bowel incontinence Gu:  Denies bladder incontinence, burning urine Ext:   No Joint pain, stiffness or swelling Skin: No skin rash, easy bruising or bleeding or hives Endoc:  No polyuria, polydipsia , polyphagia or weight change Psych: No depression, insomnia or hallucinations  Other:  All other systems negative  Physical Examination:   VS: BP 100/58 mmHg  Pulse 66  Temp(Src) 98.3 F (36.8 C) (Oral)  Resp 20  Ht  (1.778 m)  Wt 250 lb (113.399 kg)  BMI 35.87 kg/m2  SpO2 95%  General Appearance: No distress, in bed, over weight, on 2 liters  [place] 02  Neuro/psych: without focal findings, mental status, speech normal, alert and oriented, cranial nerves 2-12 intact, reflexes normal and symmetric, sensation grossly normal  HEENT: PERRLA, EOM intact, no ptosis, no other lesions noticed NECK: No stridor, no jvd Pulmonary:.occasional wheezing, No rales    Cardiovascular:  Normal S1,S2.  No m/r/g.  Abdominal aorta pulsation normal.    Abdomen:Benign, Soft, non-tender, No masses, hepatosplenomegaly, No lymphadenopathy Endoc: No evident thyromegaly, no signs of acromegaly or Cushing features Skin:  warm, no rashes, no ecchymosis  Extremities: normal, no cyanosis, clubbing, no edema, warm with normal capillary refill.    Labs results:   Recent Labs     04/21/15  0630  04/22/15  0519  04/23/15  0507  HGB  13.4  14.2   --   HCT  40.8  43.0   --   MCV  86.3  88.0   --   WBC  11.7*  9.9   --   INR  3.52  3.65  2.96  ,    Rad results:  CT CHEST WITH CONTRAST  TECHNIQUE: Multidetector CT imaging of the chest was performed during intravenous contrast administration.  CONTRAST: 75mL ISOVUE-300 IOPAMIDOL (ISOVUE-300) INJECTION 61%  COMPARISON: Chest radiograph, 04/18/2015  FINDINGS: Neck base and axilla: No mass or adenopathy. Normal thyroid.  Mediastinum and hila: Heart normal in size  and configuration. No coronary artery calcifications. Great vessels are normal in caliber. No mediastinal or hilar masses or enlarged lymph nodes.  Lungs and pleural: There is lower lobe, right middle lobe and left upper lobe lingula bronchial wall thickening with peribronchovascular patchy and linear opacities. Findings may be due to a combination of reactive airways disease and atelectasis. Acute bronchitis and possibly bronchopneumonia is possible. The remainder of the lungs is clear. No mass or suspicious nodule. No pleural effusion or pneumothorax.  Limited upper abdomen: Unremarkable.  Musculoskeletal: Minor endplate spurring along the thoracic spine. Otherwise unremarkable.  IMPRESSION: 1. Lower lung bronchial wall thickening with peribronchovascular opacities. This may be due to reactive airway disease and atelectasis. Acute bronchitis or possibly bronchopneumonia is possible. No other acute abnormality. No pulmonary edema.   Electronically Signed  By: Amie Portlandavid Ormond M.D.  On: 04/21/2015 14:12     Assessment and Plan: 1 copd exacerbation, improved, may be able to go home today -symbicort 160/ spiriva/albuterol -pred taper -out patient f/u with pfts -wean fio2 as tolerated -evaluate for portable oxygen prior to d/c -add duo nebs on d/c -weight loss  2 RML pneumonia, f/u xray looks better -complete a total of ten days of present antibiotic coverage (ceftin)    I have personally obtained a history, examined the patient, evaluated laboratory and imaging results, formulated the assessment and plan and placed orders.  The Patient requires high complexity decision making for assessment and support, frequent evaluation and titration of therapies, application of advanced monitoring technologies and extensive interpretation of multiple databases.   Herbon Fleming,M.D. Pulmonary & Critical care Medicine [facility]

## 2015-04-23 NOTE — Progress Notes (Signed)
[facility] [place] at [facility]                                                                                                                                                                                            Patient Demographics   Anna Adkins, is a 50 y.o. female, DOB - Sep 21, 1965, ZOX:[id number]  Admit date - 04/18/2015   Admitting Physician Alford Highland, MD  Outpatient Primary MD for the patient is SHARPE, Dewitt Rota, MD   LOS - 5  Subjective:  Pt oxygen requirment less on 2l oxygen feeling better Review of Systems:   CONSTITUTIONAL: No documented fever. No fatigue, weakness. No weight gain, no weight loss.  EYES: No blurry or double vision.  ENT: No tinnitus. No postnasal drip. No redness of the oropharynx.  RESPIRATORY: Positive cough, positive wheeze, no hemoptysis. Positive dyspnea.  CARDIOVASCULAR: No chest pain. No orthopnea. No palpitations. No syncope.  GASTROINTESTINAL: No nausea, no vomiting or diarrhea. No abdominal pain. No melena or hematochezia.  GENITOURINARY: No dysuria or hematuria.  ENDOCRINE: No polyuria or nocturia. No heat or cold intolerance.  HEMATOLOGY: No anemia. No bruising. No bleeding.  INTEGUMENTARY: No rashes. No lesions.  MUSCULOSKELETAL: No arthritis. No swelling. No gout.  NEUROLOGIC: No numbness, tingling, or ataxia. No seizure-type activity.  PSYCHIATRIC: No anxiety. No insomnia. No ADD.    Vitals:   Filed Vitals:   04/23/15 0441 04/23/15 0728 04/23/15 0907 04/23/15 0908  BP: 115/60  95/60 100/58  Pulse: 62  66   Temp: 98.3 F (36.8 C)     TempSrc: Oral     Resp: 18  20   Height:      Weight:      SpO2: 94% 94% 95%     Wt Readings from Last 3 Encounters:  04/18/15 113.399 kg (250 lb)     Intake/Output Summary (Last 24 hours) at 04/23/15 1019 Last data filed at 04/22/15 1900  Gross per 24 hour  Intake      0 ml  Output      0 ml  Net      0 ml    Physical Exam:   GENERAL:  Pleasant-appearing in no apparent distress.  HEAD, EYES, EARS, NOSE AND THROAT: Atraumatic, normocephalic. Extraocular muscles are intact. Pupils equal and reactive to light. Sclerae anicteric. No conjunctival injection. No oro-pharyngeal erythema.  NECK: Supple. There is no jugular venous distention. No bruits, no lymphadenopathy, no thyromegaly.  HEART: Regular rate and rhythm,. No murmurs, no rubs, no clicks.  LUNGS: Diminished breath sounds bilaterally without any necessary muscle usage ABDOMEN: Soft, flat, nontender, nondistended.  Has good bowel sounds. No hepatosplenomegaly appreciated.  EXTREMITIES: No evidence of any cyanosis, clubbing, or peripheral edema.  +2 pedal and radial pulses bilaterally.  NEUROLOGIC: The patient is alert, awake, and oriented x3 with no focal motor or sensory deficits appreciated bilaterally.  SKIN: Moist and warm with no rashes appreciated.  Psych: Not anxious, depressed LN: No inguinal LN enlargement    Antibiotics   Anti-infectives    Start     Dose/Rate Route Frequency Ordered Stop   04/19/15 1800  cefTRIAXone (ROCEPHIN) 2 g in dextrose 5 % 50 mL IVPB     2 g 100 mL/hr over 30 Minutes Intravenous Every 24 hours 04/18/15 1603     04/19/15 1600  azithromycin (ZITHROMAX) tablet 250 mg     250 mg Oral Daily 04/18/15 1604     04/18/15 1515  cefTRIAXone (ROCEPHIN) 2 g in dextrose 5 % 50 mL IVPB     2 g 100 mL/hr over 30 Minutes Intravenous  Once 04/18/15 1514 04/18/15 1911   04/18/15 1515  azithromycin (ZITHROMAX) 500 mg in dextrose 5 % 250 mL IVPB     500 mg 250 mL/hr over 60 Minutes Intravenous STAT 04/18/15 1514 04/18/15 1722      Medications   Scheduled Meds: . acetylcysteine  4 mL Nebulization BID  . azithromycin  250 mg Oral Daily  . budesonide (PULMICORT) nebulizer solution  0.25 mg Nebulization BID  . cefTRIAXone (ROCEPHIN)  IV  2 g Intravenous Q24H  . clonazePAM  0.5 mg Oral BID  . escitalopram  10 mg Oral QHS  . guaiFENesin  600 mg  Oral BID  . ipratropium-albuterol  3 mL Nebulization Q6H  . loratadine  10 mg Oral Daily  . methylPREDNISolone (SOLU-MEDROL) injection  60 mg Intravenous Q12H  . multivitamin with minerals  1 tablet Oral Daily  . warfarin  2.5 mg Oral ONCE-1800  . Warfarin - Pharmacist Dosing Inpatient   Does not apply q1800   Continuous Infusions:  PRN Meds:.acetaminophen **OR** acetaminophen, sodium chloride   Data Review:   Micro Results Recent Results (from the past 240 hour(s))  Rapid Influenza A&B Antigens ([facility] only)     Status: None   Collection Time: 04/18/15  2:08 PM  Result Value Ref Range Status   Influenza A ([facility]) NEGATIVE NEGATIVE Final   Influenza B ([facility]) NEGATIVE NEGATIVE Final  Culture, blood (Routine X 2) w Reflex to ID Panel     Status: None (Preliminary result)   Collection Time: 04/18/15  3:17 PM  Result Value Ref Range Status   Specimen Description BLOOD LEFT ANTECUBITAL  Final   Special Requests BOTTLES DRAWN AEROBIC AND ANAEROBIC  6CC  Final   Culture NO GROWTH 3 DAYS  Final   Report Status PENDING  Incomplete  Culture, blood (Routine X 2) w Reflex to ID Panel     Status: None (Preliminary result)   Collection Time: 04/18/15  3:17 PM  Result Value Ref Range Status   Specimen Description BLOOD RIGHT ANTECUBITAL  Final   Special Requests BOTTLES DRAWN AEROBIC AND ANAEROBIC  6CC  Final   Culture NO GROWTH 3 DAYS  Final   Report Status PENDING  Incomplete    Radiology Reports Dg Chest 2 View  04/18/2015  CLINICAL DATA:  50 year old presenting with acute hypoxia while at her primary provider's office earlier today, oxygen saturation on room air 88%. Recent onset of shortness of breath and productive cough. Patient currently being treated for sinusitis with antibiotics and prednisone  EXAM: CHEST  2 VIEW COMPARISON:  02/18/2012. FINDINGS: Cardiac silhouette upper normal in size, unchanged. Thoracic aorta mildly atherosclerotic, unchanged. Hilar and mediastinal contours  otherwise unremarkable. Linear atelectasis in the left upper lobe. Streaky and patchy airspace opacity in the right middle lobe. Prominent bronchovascular markings diffusely and moderate central peribronchial thickening. No pleural effusions. Pulmonary vascularity normal. Mild degenerative changes involving the thoracic spine with slight exaggeration of the usual thoracic kyphosis. IMPRESSION: Right middle lobe bronchopneumonia superimposed upon moderate changes of acute bronchitis and/or asthma. Linear atelectasis in the left upper lobe. Electronically Signed   By: Hulan Saas M.D.   On: 04/18/2015 14:56   Ct Chest W Contrast  04/21/2015  CLINICAL DATA:  Patient diagnosed and admitted to the hospital with pneumonia. Not improving. Patient reports chest tightness, feeling exhausted and coughing with some shortness of breath. EXAM: CT CHEST WITH CONTRAST TECHNIQUE: Multidetector CT imaging of the chest was performed during intravenous contrast administration. CONTRAST:  75mL ISOVUE-300 IOPAMIDOL (ISOVUE-300) INJECTION 61% COMPARISON:  Chest radiograph, 04/18/2015 FINDINGS: Neck base and axilla:  No mass or adenopathy.  Normal thyroid. Mediastinum and hila: Heart normal in size and configuration. No coronary artery calcifications. Great vessels are normal in caliber. No mediastinal or hilar masses or enlarged lymph nodes. Lungs and pleural: There is lower lobe, right middle lobe and left upper lobe lingula bronchial wall thickening with peribronchovascular patchy and linear opacities. Findings may be due to a combination of reactive airways disease and atelectasis. Acute bronchitis and possibly bronchopneumonia is possible. The remainder of the lungs is clear. No mass or suspicious nodule. No pleural effusion or pneumothorax. Limited upper abdomen:  Unremarkable. Musculoskeletal: Minor endplate spurring along the thoracic spine. Otherwise unremarkable. IMPRESSION: 1. Lower lung bronchial wall thickening with  peribronchovascular opacities. This may be due to reactive airway disease and atelectasis. Acute bronchitis or possibly bronchopneumonia is possible. No other acute abnormality. No pulmonary edema. Electronically Signed   By: Amie Portland M.D.   On: 04/21/2015 14:12     CBC  Recent Labs Lab 04/18/15 1339 04/19/15 0456 04/21/15 0630 04/22/15 0519  WBC 9.8 7.2 11.7* 9.9  HGB 15.4 13.8 13.4 14.2  HCT 47.2* 42.0 40.8 43.0  PLT 247 244 245 258  MCV 87.6 87.9 86.3 88.0  MCH 28.5 29.0 28.4 29.2  MCHC 32.6 32.9 32.9 33.1  RDW 15.1* 15.5* 15.6* 15.1*  LYMPHSABS 2.3  --   --   --   MONOABS 0.6  --   --   --   EOSABS 0.2  --   --   --   BASOSABS 0.0  --   --   --     Chemistries   Recent Labs Lab 04/18/15 1339 04/19/15 0456  NA 137 136  K 3.3* 4.1  CL 102 100*  CO2 29 29  GLUCOSE 80 163*  BUN 14 18  CREATININE 0.74 0.82  CALCIUM 9.2 9.0   ------------------------------------------------------------------------------------------------------------------ estimated creatinine clearance is 110.8 mL/min (by C-G formula based on Cr of 0.82). ------------------------------------------------------------------------------------------------------------------ No results for input(s): HGBA1C in the last 72 hours. ------------------------------------------------------------------------------------------------------------------ No results for input(s): CHOL, HDL, LDLCALC, TRIG, CHOLHDL, LDLDIRECT in the last 72 hours. ------------------------------------------------------------------------------------------------------------------ No results for input(s): TSH, T4TOTAL, T3FREE, THYROIDAB in the last 72 hours.  Invalid input(s): FREET3 ------------------------------------------------------------------------------------------------------------------ No results for input(s): VITAMINB12, FOLATE, FERRITIN, TIBC, IRON, RETICCTPCT in the last 72 hours.  Coagulation profile  Recent Labs Lab  04/19/15 1327 04/20/15 0442 04/21/15 0630 04/22/15 0519 04/23/15 0507  INR 2.59  3.06 3.52 3.65 2.96    No results for input(s): DDIMER in the last 72 hours.  Cardiac Enzymes  Recent Labs Lab 04/18/15 1339  TROPONINI <0.03   ------------------------------------------------------------------------------------------------------------------ Invalid input(s): POCBNP    Assessment & Plan  Patient is a 50 year old presents with shortness of breath   1. Acute respiratory failure with hypoxia.  Appreciate pulmonary input  Patient's breathing is improved  Continue all more days of antibiotics and steroids  May need oxygen on discharge tomorrow 2. Acute on chronic COPD exacerbation continue Solu-Medrol twice a day Prednisone orally on discharge 3. Bilateral pneumonia Continue azithromycin and ceftriaxone 4. Hypertension blood pressure stable 5. 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  Pharmacy following INRs trending down Coumadin 2.5 mg daily 6. History of CVA on Coumadin therapy 7. Anxiety depression continue Lexapro is currently stable    Code Status Orders        Start     Ordered   04/18/15 1603  Full code   Continuous     04/18/15 1602    Code Status History    Date Active Date Inactive Code Status Order ID Comments User Context   This patient has a current code status but no historical code status.           Consultsnone  DVT Prophylaxis  Lovenox Lab Results  Component Value Date   PLT 258 04/22/2015     Time Spent in minutes  25 minutes  Anticipate discharge home tomorrow   Auburn Bilberry M.D on 04/23/2015 at 10:19 AM  Between 7am to 6pm - Pager - [phone]  After 6pm go to [email or web address] - password [email or web address] [facility]  [facility] [place] [facility]   Office  [phone]

## 2015-04-24 LAB — CULTURE, BLOOD (ROUTINE X 2)
Culture: NO GROWTH
Culture: NO GROWTH

## 2015-04-24 LAB — PROTIME-INR
INR: 2.75
Prothrombin Time: 28.7 seconds — ABNORMAL HIGH (ref 11.4–15.0)

## 2015-04-24 MED ORDER — WARFARIN SODIUM 4 MG PO TABS: 4.0000 mg | ORAL_TABLET | Freq: Every day | ORAL | Status: DC

## 2015-04-24 NOTE — Progress Notes (Signed)
Patient is discharge is home in a stable condition on room air , no new meds added , summary and f/u care given . Pick up by pt's mother

## 2015-04-24 NOTE — Discharge Summary (Signed)
Anna Adkins, 50 y.o., DOB 03/16/1965, MRN [id number]. Admission date: 04/18/2015 Discharge Date 04/24/2015 Primary MD Pricilla HolmSHARPE, LESLIE M, MD Admitting Physician Alford Highlandichard Wieting, MD  Admission Diagnosis  Community acquired pneumonia [J18.9]  Discharge Diagnosis   Active Problems:   Acute respiratory failure with hypoxia (HCC)  Acute on chronic COPD exasperation Bilateral pneumonia Hypertension History of PE and DVT History of CVA Anxiety Depression        Hospital Course Anna Adkins is a 50 y.o. female sent in by PMD to the ER for hypoxia. The patient has not felt well for a while. She is having some chest tightness, no energy and feeling exhausted. She does have a history of COPD initially in the ER chest x-ray was negative however patient was requiring 4 L of oxygen. She was admitted for acute on chronic COPD exasperation. She continued to be hypoxic and short of breath therefore had a CT scan of the chest which did show bilateral pneumonia. Patient was treated with antibiotics and now she is off oxygen. Her breathing is much improved. Patient has a prescription for prednisone taper and antibiotics that she will finish the course.            Consults  pulmonary/intensive care  Significant Tests:  See full reports for all details      Dg Chest 2 View  04/18/2015  CLINICAL DATA:  50 year old presenting with acute hypoxia while at her primary provider's office earlier today, oxygen saturation on room air 88%. Recent onset of shortness of breath and productive cough. Patient currently being treated for sinusitis with antibiotics and prednisone EXAM: CHEST  2 VIEW COMPARISON:  02/18/2012. FINDINGS: Cardiac silhouette upper normal in size, unchanged. Thoracic aorta mildly atherosclerotic, unchanged. Hilar and mediastinal contours otherwise unremarkable. Linear atelectasis in the left upper lobe. Streaky and patchy airspace opacity in the right middle lobe. Prominent bronchovascular  markings diffusely and moderate central peribronchial thickening. No pleural effusions. Pulmonary vascularity normal. Mild degenerative changes involving the thoracic spine with slight exaggeration of the usual thoracic kyphosis. IMPRESSION: Right middle lobe bronchopneumonia superimposed upon moderate changes of acute bronchitis and/or asthma. Linear atelectasis in the left upper lobe. Electronically Signed   By: Hulan Saashomas  Lawrence M.D.   On: 04/18/2015 14:56   Ct Chest W Contrast  04/21/2015  CLINICAL DATA:  Patient diagnosed and admitted to the hospital with pneumonia. Not improving. Patient reports chest tightness, feeling exhausted and coughing with some shortness of breath. EXAM: CT CHEST WITH CONTRAST TECHNIQUE: Multidetector CT imaging of the chest was performed during intravenous contrast administration. CONTRAST:  75mL ISOVUE-300 IOPAMIDOL (ISOVUE-300) INJECTION 61% COMPARISON:  Chest radiograph, 04/18/2015 FINDINGS: Neck base and axilla:  No mass or adenopathy.  Normal thyroid. Mediastinum and hila: Heart normal in size and configuration. No coronary artery calcifications. Great vessels are normal in caliber. No mediastinal or hilar masses or enlarged lymph nodes. Lungs and pleural: There is lower lobe, right middle lobe and left upper lobe lingula bronchial wall thickening with peribronchovascular patchy and linear opacities. Findings may be due to a combination of reactive airways disease and atelectasis. Acute bronchitis and possibly bronchopneumonia is possible. The remainder of the lungs is clear. No mass or suspicious nodule. No pleural effusion or pneumothorax. Limited upper abdomen:  Unremarkable. Musculoskeletal: Minor endplate spurring along the thoracic spine. Otherwise unremarkable. IMPRESSION: 1. Lower lung bronchial wall thickening with peribronchovascular opacities. This may be due to reactive airway disease and atelectasis. Acute bronchitis or possibly bronchopneumonia is possible. No  other  acute abnormality. No pulmonary edema. Electronically Signed   By: Amie Portland M.D.   On: 04/21/2015 14:12       Today   Subjective:   Anna Adkins  feels much better shortness of breath is resolved  Objective:   Blood pressure 109/63, pulse 70, temperature 97.8 F (36.6 C), temperature source Oral, resp. rate 16, height  (1.778 m), weight 113.399 kg (250 lb), SpO2 94 %.  .  Intake/Output Summary (Last 24 hours) at 04/24/15 1338 Last data filed at 04/23/15 1900  Gross per 24 hour  Intake    340 ml  Output      0 ml  Net    340 ml    Exam VITAL SIGNS: Blood pressure 109/63, pulse 70, temperature 97.8 F (36.6 C), temperature source Oral, resp. rate 16, height  (1.778 m), weight 113.399 kg (250 lb), SpO2 94 %.  GENERAL:  50 y.o.-year-old patient lying in the bed with no acute distress.  EYES: Pupils equal, round, reactive to light and accommodation. No scleral icterus. Extraocular muscles intact.  HEENT: Head atraumatic, normocephalic. Oropharynx and nasopharynx clear.  NECK:  Supple, no jugular venous distention. No thyroid enlargement, no tenderness.  LUNGS: Normal breath sounds bilaterally, no wheezing, rales,rhonchi or crepitation. No use of accessory muscles of respiration.  CARDIOVASCULAR: S1, S2 normal. No murmurs, rubs, or gallops.  ABDOMEN: Soft, nontender, nondistended. Bowel sounds present. No organomegaly or mass.  EXTREMITIES: No pedal edema, cyanosis, or clubbing.  NEUROLOGIC: Cranial nerves II through XII are intact. Muscle strength 5/5 in all extremities. Sensation intact. Gait not checked.  PSYCHIATRIC: The patient is alert and oriented x 3.  SKIN: No obvious rash, lesion, or ulcer.   Data Review     CBC w Diff: Lab Results  Component Value Date   WBC 9.9 04/22/2015   WBC 13.5* 02/18/2012   HGB 14.2 04/22/2015   HGB 13.1 02/18/2012   HCT 43.0 04/22/2015   HCT 40.7 02/18/2012   PLT 258 04/22/2015   PLT 307 02/18/2012   LYMPHOPCT  24 04/18/2015   LYMPHOPCT 13.7 02/18/2012   MONOPCT 6 04/18/2015   MONOPCT 7.3 02/18/2012   EOSPCT 2 04/18/2015   EOSPCT 1.1 02/18/2012   BASOPCT 0 04/18/2015   BASOPCT 1.3 02/18/2012   CMP: Lab Results  Component Value Date   NA 136 04/19/2015   NA 136 02/18/2012   K 4.1 04/19/2015   K 3.8 02/18/2012   CL 100* 04/19/2015   CL 102 02/18/2012   CO2 29 04/19/2015   CO2 24 02/18/2012   BUN 18 04/19/2015   BUN 5* 02/18/2012   CREATININE 0.82 04/19/2015   CREATININE 0.79 02/18/2012   PROT 7.8 02/18/2012   ALBUMIN 2.7* 02/18/2012   BILITOT 0.7 02/18/2012   ALKPHOS 81 02/18/2012   AST 13* 02/18/2012   ALT 16 02/18/2012  .  Micro Results Recent Results (from the past 240 hour(s))  Rapid Influenza A&B Antigens ([facility] only)     Status: None   Collection Time: 04/18/15  2:08 PM  Result Value Ref Range Status   Influenza A ([facility]) NEGATIVE NEGATIVE Final   Influenza B ([facility]) NEGATIVE NEGATIVE Final  Culture, blood (Routine X 2) w Reflex to ID Panel     Status: None   Collection Time: 04/18/15  3:17 PM  Result Value Ref Range Status   Specimen Description BLOOD LEFT ANTECUBITAL  Final   Special Requests BOTTLES DRAWN AEROBIC AND ANAEROBIC  6CC  Final  Culture NO GROWTH 6 DAYS  Final   Report Status 04/24/2015 FINAL  Final  Culture, blood (Routine X 2) w Reflex to ID Panel     Status: None   Collection Time: 04/18/15  3:17 PM  Result Value Ref Range Status   Specimen Description BLOOD RIGHT ANTECUBITAL  Final   Special Requests BOTTLES DRAWN AEROBIC AND ANAEROBIC  [facility]  Final   Culture NO GROWTH 6 DAYS  Final   Report Status 04/24/2015 FINAL  Final        Code Status Orders        Start     Ordered   04/18/15 1603  Full code   Continuous     04/18/15 1602    Code Status History    Date Active Date Inactive Code Status Order ID Comments User Context   This patient has a current code status but no historical code status.          Follow-up Information     Follow up with Pricilla Holm, MD On 04/30/2015.   Specialty:  Family Medicine   Why:  at 10:00   Contact information:   [address] [place] [address] [phone]       Discharge Medications     Medication List    TAKE these medications        Biotin 1000 MCG tablet  Take 1,000 mcg by mouth daily.     clonazePAM 0.5 MG tablet  Commonly known as:  KLONOPIN  Take 0.5 mg by mouth 2 (two) times daily.     escitalopram 10 MG tablet  Commonly known as:  LEXAPRO  Take 10 mg by mouth at bedtime.     loratadine 10 MG tablet  Commonly known as:  CLARITIN  Take 10 mg by mouth daily.     multivitamin with minerals Tabs tablet  Take 1 tablet by mouth daily.     warfarin 4 MG tablet  Commonly known as:  COUMADIN  Take 4 mg by mouth every evening.           Total Time in preparing paper work, data evaluation and todays exam - 35 minutes  Auburn Bilberry M.D on 04/24/2015 at 1:38 PM  [facility]   Office  [phone]

## 2015-04-24 NOTE — Discharge Instructions (Signed)
°  DIET:  Cardiac diet  DISCHARGE CONDITION:  Stable  ACTIVITY:  Activity as tolerated  OXYGEN:  Home Oxygen: No.   Oxygen Delivery: room air  DISCHARGE LOCATION:  home    ADDITIONAL DISCHARGE INSTRUCTION:please finish course of predinosone you have at home, Also finish course of antibiotics at home   If you experience worsening of your admission symptoms, develop shortness of breath, life threatening emergency, suicidal or homicidal thoughts you must seek medical attention immediately by calling 911 or calling your MD immediately  if symptoms less severe.  You Must read complete instructions/literature along with all the possible adverse reactions/side effects for all the Medicines you take and that have been prescribed to you. Take any new Medicines after you have completely understood and accpet all the possible adverse reactions/side effects.   Please note  You were cared for by a hospitalist during your hospital stay. If you have any questions about your discharge medications or the care you received while you were in the hospital after you are discharged, you can call the unit and asked to speak with the hospitalist on call if the hospitalist that took care of you is not available. Once you are discharged, your primary care physician will handle any further medical issues. Please note that NO REFILLS for any discharge medications will be authorized once you are discharged, as it is imperative that you return to your primary care physician (or establish a relationship with a primary care physician if you do not have one) for your aftercare needs so that they can reassess your need for medications and monitor your lab values.

## 2015-04-24 NOTE — Progress Notes (Signed)
ANTICOAGULATION CONSULT NOTE - FOLLOW UP Consult  Pharmacy Consult for warfarin Indication: DVT/PE  Allergies  Allergen Reactions  . Sulfa Antibiotics Anaphylaxis    Patient Measurements: Height: 5\' 10"  (177.8 cm) Weight: 250 lb (113.399 kg) IBW/kg (Calculated) : 68.5 Heparin Dosing Weight:   Vital Signs: Temp: 97.9 F (36.6 C) (04/13 0517) Temp Source: Oral (04/13 0517) BP: 148/80 mmHg (04/13 0845) Pulse Rate: 72 (04/13 0845)  Labs:  Recent Labs  04/22/15 0519 04/23/15 0507 04/24/15 0447  HGB 14.2  --   --   HCT 43.0  --   --   PLT 258  --   --   LABPROT 35.5* 30.3* 28.7*  INR 3.65 2.96 2.75    Estimated Creatinine Clearance: 110.8 mL/min (by C-G formula based on Cr of 0.82).   Medical History: Past Medical History  Diagnosis Date  . Arthritis   . Pulmonary emboli (HCC)   . DVT (deep venous thrombosis) (HCC)   . Depression   . Anxiety     Medications:    Assessment: Pharmacy consulted to dose and monitor warfarin therapy in this 50 year old female being treated with warfarin therapy.   Home dose: warfarin 4 mg PO daily   4/8: INR: 2.59; 4 mg  4/9 INR: 3.06; 4 mg  4/10 INR 3.52; HELD  4/11: INR: 3.65; held 4/12: INR: 2.96; 2.5 mg 4/13: INR: 2.75    Goal of Therapy:  INR: 2-3  Plan:  INR is therapeutic; however trending downward likely seeing results of held doses. Will restart home dose of warfarin 4 mg  PO daily. PT/INR with am labs.   Montray Kliebert D 04/24/2015,9:21 AM

## 2015-05-08 ENCOUNTER — Ambulatory Visit: Payer: Self-pay | Admitting: Ophthalmology

## 2015-05-15 ENCOUNTER — Ambulatory Visit: Payer: Self-pay | Admitting: Ophthalmology

## 2015-10-21 ENCOUNTER — Telehealth: Payer: Self-pay | Admitting: Family Medicine

## 2015-11-20 ENCOUNTER — Ambulatory Visit: Payer: Self-pay | Admitting: Ophthalmology

## 2017-08-22 ENCOUNTER — Other Ambulatory Visit: Payer: Self-pay | Admitting: Family Medicine

## 2017-08-22 DIAGNOSIS — Z1231 Encounter for screening mammogram for malignant neoplasm of breast: Secondary | ICD-10-CM

## 2017-09-09 ENCOUNTER — Ambulatory Visit
Admission: RE | Admit: 2017-09-09 | Discharge: 2017-09-09 | Disposition: A | Discharge status: Home / Self Care | Payer: Medicare Other | Source: Ambulatory Visit | Attending: Family Medicine | Admitting: Family Medicine

## 2017-09-09 DIAGNOSIS — Z1231 Encounter for screening mammogram for malignant neoplasm of breast: Secondary | ICD-10-CM | POA: Insufficient documentation

## 2017-11-14 ENCOUNTER — Ambulatory Visit (INDEPENDENT_AMBULATORY_CARE_PROVIDER_SITE_OTHER): Payer: Medicare Other | Admitting: Urology

## 2017-11-14 ENCOUNTER — Encounter: Payer: Self-pay | Admitting: Urology

## 2017-11-14 VITALS — BP 134/74 | HR 83 | Ht 68.0 in | Wt 243.0 lb

## 2017-11-14 DIAGNOSIS — R310 Gross hematuria: Secondary | ICD-10-CM | POA: Diagnosis not present

## 2017-11-14 DIAGNOSIS — R3129 Other microscopic hematuria: Secondary | ICD-10-CM | POA: Diagnosis not present

## 2017-11-14 LAB — URINALYSIS, COMPLETE
BILIRUBIN UA: NEGATIVE
Glucose, UA: NEGATIVE
Ketones, UA: NEGATIVE
LEUKOCYTES UA: NEGATIVE
Nitrite, UA: NEGATIVE
PH UA: 6.5 (ref 5.0–7.5)
PROTEIN UA: NEGATIVE
SPEC GRAV UA: 1.01 (ref 1.005–1.030)
Urobilinogen, Ur: 0.2 mg/dL (ref 0.2–1.0)

## 2017-11-14 LAB — MICROSCOPIC EXAMINATION: WBC, UA: NONE SEEN /hpf (ref 0–5)

## 2017-11-14 NOTE — Progress Notes (Signed)
11/14/2017 10:15 AM   Anna Adkins Jul 18, 1965 [id number]  Referring provider: Toy Cookey, FNP [address] [place], [place] [address]  No chief complaint on file.   HPI: Consulted to assess the patient's recurrent gross hematuria.  At least 1 of the episodes a month ago she had suprapubic pressure.  Antibiotics may have helped.  The second hematuria episode she did not get an antibiotic.  She has not had a recent renal x-ray  She has a smoking history.  She is on a blood thinner.  She does not take daily aspirin.  At baseline she voids every 2 or 3 hours.  She leaks a little bit when she has a cold with coughing.  She gets up at least once a night  She denies a history of previous GU surgery kidney stones and she has not had a hysterectomy  Modifying factors: There are no other modifying factors  Associated signs and symptoms: There are no other associated signs and symptoms Aggravating and relieving factors: There are no other aggravating or relieving factors Severity: Moderate Duration: Persistent   PMH: Past Medical History:  Diagnosis Date  . Anxiety   . Anxiety   . Arthritis   . Depression   . Depression   . DVT (deep venous thrombosis) (HCC)   . DVT of leg (deep venous thrombosis) (HCC) 2014  . Pulmonary emboli (HCC)   . TIA (transient ischemic attack) 2014    Surgical History: Past Surgical History:  Procedure Laterality Date  . Blood clot removal    . ECTOPIC PREGNANCY SURGERY    . HERNIA REPAIR    . HERNIA REPAIR  2001  . SKIN GRAFT    . TUBAL LIGATION      Home Medications:  Allergies as of 11/14/2017      Reactions   Sulfa Antibiotics Anaphylaxis      Medication List        Accurate as of 11/14/17 10:15 AM. Always use your most recent med list.          Biotin 1000 MCG tablet Take 1,000 mcg by mouth daily.   escitalopram 20 MG tablet Commonly known as:  LEXAPRO Take 20 mg by mouth daily.   loratadine 10 MG tablet Commonly  known as:  CLARITIN Take 10 mg by mouth daily.   multivitamin with minerals Tabs tablet Take 1 tablet by mouth daily.   VENTOLIN HFA 108 (90 Base) MCG/ACT inhaler Generic drug:  albuterol   warfarin 4 MG tablet Commonly known as:  COUMADIN Take 4 mg by mouth every evening.       Allergies:  Allergies  Allergen Reactions  . Sulfa Antibiotics Anaphylaxis    Family History: Family History  Problem Relation Age of Onset  . Healthy Mother   . Heart disease Father   . Diabetes Father   . Asthma Father   . Breast cancer Neg Hx     Social History:  reports that she has quit smoking. She has never used smokeless tobacco. She reports that she does not drink alcohol or use drugs.  ROS: UROLOGY Frequent Urination?: Yes Hard to postpone urination?: No Burning/pain with urination?: No Get up at night to urinate?: Yes Leakage of urine?: Yes Urine stream starts and stops?: Yes Trouble starting stream?: No Do you have to strain to urinate?: No Blood in urine?: No Urinary tract infection?: Yes Sexually transmitted disease?: No Injury to kidneys or bladder?: No Painful intercourse?: No Weak stream?: No Currently pregnant?:  No Vaginal bleeding?: No Last menstrual period?: n  Gastrointestinal Nausea?: No Vomiting?: No Indigestion/heartburn?: No Diarrhea?: No Constipation?: Yes  Constitutional Fever: No Night sweats?: No Weight loss?: No Fatigue?: No  Skin Skin rash/lesions?: Yes Itching?: Yes  Eyes Blurred vision?: No Double vision?: No  Ears/Nose/Throat Sore throat?: Yes Sinus problems?: Yes  Hematologic/Lymphatic Swollen glands?: No Easy bruising?: Yes  Cardiovascular Leg swelling?: No Chest pain?: No  Respiratory Cough?: Yes Shortness of breath?: No  Endocrine Excessive thirst?: No  Musculoskeletal Back pain?: Yes Joint pain?: Yes  Neurological Headaches?: No Dizziness?: No  Psychologic Depression?: Yes Anxiety?: Yes  Physical  Exam: BP 134/74 (BP Location: Left Arm, Patient Position: Sitting, Cuff Size: Normal)   Pulse 83   Ht 5\' 8"  (1.727 m)   Wt 110.2 kg   BMI 36.95 kg/m   Constitutional:  Alert and oriented, No acute distress. HEENT: [place] AT, moist mucus membranes.  Trachea midline, no masses. Cardiovascular: No clubbing, cyanosis, or edema. Respiratory: Normal respiratory effort, no increased work of breathing. GI: Abdomen is soft, nontender, nondistended, no abdominal masses GU: No CVA tenderness.  No bladder tenderness Skin: No rashes, bruises or suspicious lesions. Lymph: No cervical or inguinal adenopathy. Neurologic: Grossly intact, no focal deficits, moving all 4 extremities. Psychiatric: Normal mood and affect.  Laboratory Data: Lab Results  Component Value Date   WBC 9.9 04/22/2015   HGB 14.2 04/22/2015   HCT 43.0 04/22/2015   MCV 88.0 04/22/2015   PLT 258 04/22/2015    Lab Results  Component Value Date   CREATININE 0.82 04/19/2015    No results found for: PSA  No results found for: TESTOSTERONE  No results found for: HGBA1C  Urinalysis No results found for: COLORURINE, APPEARANCEUR, LABSPEC, PHURINE, GLUCOSEU, HGBUR, BILIRUBINUR, KETONESUR, PROTEINUR, UROBILINOGEN, NITRITE, LEUKOCYTESUR  Pertinent Imaging: None  Assessment & Plan: Work-up for hematuria discussed.  Return for CT scan and cystoscopy.  Urine sent for culture  1. Microscopic hematuria  - CULTURE, URINE COMPREHENSIVE - Urinalysis, Complete   No follow-ups on file.  Martina Sinner, MD   [facility] [address], [address] [place], [place] [address] [phone]

## 2017-11-16 LAB — CULTURE, URINE COMPREHENSIVE

## 2017-12-07 ENCOUNTER — Ambulatory Visit
Admission: RE | Admit: 2017-12-07 | Discharge: 2017-12-07 | Disposition: A | Discharge status: Home / Self Care | Payer: Medicare Other | Source: Ambulatory Visit | Attending: Urology | Admitting: Urology

## 2017-12-07 DIAGNOSIS — R310 Gross hematuria: Secondary | ICD-10-CM | POA: Insufficient documentation

## 2017-12-07 LAB — POCT I-STAT CREATININE: Creatinine, Ser: 0.8 mg/dL (ref 0.44–1.00)

## 2017-12-07 MED ORDER — IOPAMIDOL (ISOVUE-300) INJECTION 61%
125.0000 mL | Freq: Once | INTRAVENOUS | Status: AC | PRN
  Administered 2017-12-07: 125 mL via INTRAVENOUS

## 2017-12-12 ENCOUNTER — Encounter: Payer: Self-pay | Admitting: Urology

## 2017-12-12 ENCOUNTER — Ambulatory Visit (INDEPENDENT_AMBULATORY_CARE_PROVIDER_SITE_OTHER): Payer: Medicare Other | Admitting: Urology

## 2017-12-12 VITALS — BP 109/70 | HR 73 | Ht 68.0 in | Wt 243.0 lb

## 2017-12-12 DIAGNOSIS — R3129 Other microscopic hematuria: Secondary | ICD-10-CM

## 2017-12-12 LAB — URINALYSIS, COMPLETE
Bilirubin, UA: NEGATIVE
GLUCOSE, UA: NEGATIVE
KETONES UA: NEGATIVE
LEUKOCYTES UA: NEGATIVE
Nitrite, UA: NEGATIVE
PROTEIN UA: NEGATIVE
RBC, UA: NEGATIVE
Specific Gravity, UA: 1.02 (ref 1.005–1.030)
Urobilinogen, Ur: 0.2 mg/dL (ref 0.2–1.0)
pH, UA: 6.5 (ref 5.0–7.5)

## 2017-12-12 LAB — MICROSCOPIC EXAMINATION
RBC, UA: NONE SEEN /hpf (ref 0–2)
WBC, UA: NONE SEEN /hpf (ref 0–5)

## 2017-12-12 NOTE — Progress Notes (Signed)
12/12/2017 9:04 AM   Anna Adkins [birth year] [id number]  Referring provider: Toy Cookey, FNP [address] [place], [place] [address]  Chief Complaint  Patient presents with  . Cysto    HPI: Consulted to assess the patient's recurrent gross hematuria.  At least 1 of the episodes a month ago she had suprapubic pressure.  Antibiotics may have helped.  The second hematuria episode she did not get an antibiotic.    She has a smoking history.  She is on a blood thinner.  She does not take daily aspirin.  At baseline she voids every 2 or 3 hours.  She leaks a little bit when she has a cold with coughing.  She gets up at least once a night   Today Frequency is stable.  Last urine culture negative.  CT scan normal.  The IVC filter reach beyond the inferior vena cava just touching the ureter but did not appear to be within the lumen of the ureter.  No further blood in urine  On pelvic examination mild hypermobility the bladder neck and no prolapse.  Cystoscopy: Utilizing sterile technique patient underwent flexible cystoscopy.  Bladder mucosa and trigone were normal.  There is no cystitis.  There is no carcinoma.  Ureters were clear.  Urethra was normal.  Procedure well-tolerated       PMH: Past Medical History:  Diagnosis Date  . Anxiety   . Anxiety   . Arthritis   . Depression   . Depression   . DVT (deep venous thrombosis) (HCC)   . DVT of leg (deep venous thrombosis) (HCC) 2014  . Pulmonary emboli (HCC)   . TIA (transient ischemic attack) 2014    Surgical History: Past Surgical History:  Procedure Laterality Date  . Blood clot removal    . ECTOPIC PREGNANCY SURGERY    . HERNIA REPAIR    . HERNIA REPAIR  2001  . SKIN GRAFT    . TUBAL LIGATION      Home Medications:  Allergies as of 12/12/2017      Reactions   Sulfa Antibiotics Anaphylaxis      Medication List        Accurate as of 12/12/17  9:04 AM. Always use your most recent med list.            Biotin 1000 MCG tablet Take 1,000 mcg by mouth daily.   escitalopram 20 MG tablet Commonly known as:  LEXAPRO Take 20 mg by mouth daily.   loratadine 10 MG tablet Commonly known as:  CLARITIN Take 10 mg by mouth daily.   multivitamin with minerals Tabs tablet Take 1 tablet by mouth daily.   VENTOLIN HFA 108 (90 Base) MCG/ACT inhaler Generic drug:  albuterol   warfarin 4 MG tablet Commonly known as:  COUMADIN Take 4 mg by mouth every evening.       Allergies:  Allergies  Allergen Reactions  . Sulfa Antibiotics Anaphylaxis    Family History: Family History  Problem Relation Age of Onset  . Healthy Mother   . Heart disease Father   . Diabetes Father   . Asthma Father   . Breast cancer Neg Hx     Social History:  reports that she has quit smoking. She has never used smokeless tobacco. She reports that she does not drink alcohol or use drugs.  ROS:  Physical Exam: BP 109/70   Pulse 73   Ht 5\' 8"  (1.727 m)   Wt 110.2 kg   BMI 36.95 kg/m   Constitutional:  Alert and oriented, No acute distress.   Laboratory Data: Lab Results  Component Value Date   WBC 9.9 04/22/2015   HGB 14.2 04/22/2015   HCT 43.0 04/22/2015   MCV 88.0 04/22/2015   PLT 258 04/22/2015    Lab Results  Component Value Date   CREATININE 0.80 12/07/2017    No results found for: PSA  No results found for: TESTOSTERONE  No results found for: HGBA1C  Urinalysis    Component Value Date/Time   APPEARANCEUR Clear 11/14/2017 1001   GLUCOSEU Negative 11/14/2017 1001   BILIRUBINUR Negative 11/14/2017 1001   PROTEINUR Negative 11/14/2017 1001   NITRITE Negative 11/14/2017 1001   LEUKOCYTESUR Negative 11/14/2017 1001    Pertinent Imaging:   Assessment & Plan: The patient has been cleared for hematuria.  It would be very rare for the IVC filter to be a culprit.  I will see the patient as needed  1. Microscopic  hematuria  - Urinalysis, Complete   Return if symptoms worsen or fail to improve.  Martina SinnerMACDIARMID,Kelby Adell A, MD  [facility] [address], [address] [place], [place] [address] [phone]

## 2018-04-15 ENCOUNTER — Other Ambulatory Visit: Payer: Self-pay

## 2018-04-15 ENCOUNTER — Emergency Department
Admission: EM | Admit: 2018-04-15 | Discharge: 2018-04-15 | Disposition: A | Discharge status: Home / Self Care | Payer: Medicare Other | Attending: Emergency Medicine | Admitting: Emergency Medicine

## 2018-04-15 ENCOUNTER — Encounter: Payer: Self-pay | Admitting: Emergency Medicine

## 2018-04-15 DIAGNOSIS — S80211A Abrasion, right knee, initial encounter: Secondary | ICD-10-CM | POA: Insufficient documentation

## 2018-04-15 DIAGNOSIS — W108XXA Fall (on) (from) other stairs and steps, initial encounter: Secondary | ICD-10-CM | POA: Insufficient documentation

## 2018-04-15 DIAGNOSIS — Y999 Unspecified external cause status: Secondary | ICD-10-CM | POA: Insufficient documentation

## 2018-04-15 DIAGNOSIS — Y92009 Unspecified place in unspecified non-institutional (private) residence as the place of occurrence of the external cause: Secondary | ICD-10-CM | POA: Insufficient documentation

## 2018-04-15 DIAGNOSIS — Z79899 Other long term (current) drug therapy: Secondary | ICD-10-CM | POA: Diagnosis not present

## 2018-04-15 DIAGNOSIS — S81811A Laceration without foreign body, right lower leg, initial encounter: Secondary | ICD-10-CM | POA: Diagnosis not present

## 2018-04-15 DIAGNOSIS — Y9389 Activity, other specified: Secondary | ICD-10-CM | POA: Insufficient documentation

## 2018-04-15 DIAGNOSIS — W19XXXA Unspecified fall, initial encounter: Secondary | ICD-10-CM

## 2018-04-15 DIAGNOSIS — Z7901 Long term (current) use of anticoagulants: Secondary | ICD-10-CM | POA: Insufficient documentation

## 2018-04-15 DIAGNOSIS — S8991XA Unspecified injury of right lower leg, initial encounter: Secondary | ICD-10-CM | POA: Diagnosis present

## 2018-04-15 MED ORDER — CEPHALEXIN 500 MG PO CAPS: 500.0000 mg | ORAL_CAPSULE | Freq: Four times a day (QID) | ORAL | 0 refills | Status: AC

## 2018-04-15 MED ORDER — CEPHALEXIN 500 MG PO CAPS
500.0000 mg | ORAL_CAPSULE | Freq: Once | ORAL | Status: DC
  Filled 2018-04-15: qty 1

## 2018-04-15 NOTE — Discharge Instructions (Signed)
You have been seen in the Emergency Department (ED) today for a fall.  Your work up does not show any concerning injuries.  Please take over-the-counter iTylenol as needed for your pain (unless you have an allergy or your doctor as told you not to take them), or take any prescribed medication as instructed.  You can expect to be quite sore for the next couple of days.  As we discussed, you should allow the Steri-Strips and Dermabond to stay on as long as possible.  They will eventually come off, likely within 1 to 2 weeks.  If the edges of the strips begin to come loose, you may trim them, but do not try to pull the tape off of your leg or you could damage your skin.  Please leave it dry for at least 24 hours.  After that you can get it wet in the shower but it would be better to not submerge her leg in the bath.  Please follow up with your doctor regarding today's Emergency Department (ED) visit and your recent fall.    Return to the ED if you have any headache, confusion, slurred speech, weakness/numbness of any arm or leg, or any increased pain.

## 2018-04-15 NOTE — ED Triage Notes (Signed)
Patient presents to Emergency Department via [facility] from home with complaints of right leg injury s/p fall.  Pt c/o bleeding hole and abrasion to knee.  Pt denies LOC or head strike.  History of clots to lower leg, and pt takes warfarin for the same, did not warfarin today.  Pt reports last TDaP less than 12 months ago.

## 2018-04-15 NOTE — ED Notes (Signed)
Blood cleaned from pt;s hands, wound remains bandaged sock removed and leg on Chux, CMS intact to distal extremities  EDP apprised

## 2018-04-15 NOTE — ED Provider Notes (Addendum)
[facility] Emergency Department Provider Note  ____________________________________________   First MD Initiated Contact with Patient 04/15/18 [phone]     (approximate)  I have reviewed the triage vital signs and the nursing notes.   HISTORY  Chief Complaint Fall; Leg Injury; and Laceration    HPI Anna Adkins is a 53 y.o. female with medical history as listed below whose medical history includes DVT and PE on warfarin.  She presents tonight by EMS for evaluation of an injury to her right leg.  She reports that she was going back in her house in a new pair of house shoes when she somehow got tripped up and fell down a couple of stairs and landed on the concrete with her right knee and right shin.  She has a laceration near the bottom of her right lower leg and a small abrasion on her right knee.   She is able to flex and extend her knee without any difficulty.  She was mostly concerned because she is on warfarin and she "lost a lot of blood" and was afraid she would need stitches.  She did not strike her head, did not lose consciousness, and has no neck pain.  She has no injuries anywhere else.  She says that her right leg is always weaker than her left because of the large blood clot she had in the surgery she had on the right leg.  Pain is mild currently and gets worse if she touches her leg.  She denies fever/chills, chest pain, shortness of breath, nausea, vomiting, and abdominal pain.  She has no high risk of 19 exposures and no symptoms.        Past Medical History:  Diagnosis Date  . Anxiety   . Anxiety   . Arthritis   . Depression   . Depression   . DVT (deep venous thrombosis) (HCC)   . DVT of leg (deep venous thrombosis) (HCC) 2014  . Pulmonary emboli (HCC)   . TIA (transient ischemic attack) 2014    Patient Active Problem List   Diagnosis Date Noted  . Acute respiratory failure with hypoxia (HCC) 04/18/2015    Past Surgical History:   Procedure Laterality Date  . Blood clot removal    . ECTOPIC PREGNANCY SURGERY    . HERNIA REPAIR    . HERNIA REPAIR  2001  . SKIN GRAFT    . TUBAL LIGATION      Prior to Admission medications   Medication Sig Start Date End Date Taking? Authorizing Provider  Biotin 1000 MCG tablet Take 1,000 mcg by mouth daily.    [provider]  escitalopram (LEXAPRO) 20 MG tablet Take 20 mg by mouth daily.  11/04/17   [provider]  loratadine (CLARITIN) 10 MG tablet Take 10 mg by mouth daily.    [provider]  Multiple Vitamin (MULTIVITAMIN WITH MINERALS) TABS tablet Take 1 tablet by mouth daily.    [provider]  VENTOLIN HFA 108 [phone] Base) MCG/ACT inhaler  08/31/17   [provider]  warfarin (COUMADIN) 4 MG tablet Take 4 mg by mouth every evening.    [provider]    Allergies Sulfa antibiotics  Family History  Problem Relation Age of Onset  . Healthy Mother   . Heart disease Father   . Diabetes Father   . Asthma Father   . Breast cancer Neg Hx     Social History Social History   Tobacco Use  .  Smoking status: Former Games developer  . Smokeless tobacco: Never Used  Substance Use Topics  . Alcohol use: Yes    Alcohol/week: 1.0 standard drinks    Types: 1 Glasses of wine per week    Comment: occasional  . Drug use: No    Review of Systems Constitutional: No fever/chills Cardiovascular: Denies chest pain. Respiratory: Denies shortness of breath. Gastrointestinal: No abdominal pain.  No nausea, no vomiting.   Genitourinary: Negative for dysuria. Musculoskeletal: Pain in right lower leg. Integumentary: Laceration to right lower leg and abrasion to right knee.   ____________________________________________   PHYSICAL EXAM:  VITAL SIGNS: ED Triage Vitals  Enc Vitals Group     BP 04/15/18 0034 (!) 151/74     Pulse Rate 04/15/18 0034 78     Resp 04/15/18 0034 18     Temp 04/15/18 0034 97.7 F (36.5 C)     Temp  Source 04/15/18 0034 Oral     SpO2 04/15/18 0034 97 %     Weight 04/15/18 0035 111.1 kg (245 lb)     Height 04/15/18 0035 1.753 m ( )     Head Circumference --      Peak Flow --      Pain Score 04/15/18 0035 4     Pain Loc --      Pain Edu? --      Excl. in GC? --     Constitutional: Alert and oriented. Well appearing and in no acute distress. Eyes: Conjunctivae are normal.  Head: Atraumatic. Nose: No congestion/rhinnorhea. Mouth/Throat: Mucous membranes are moist. Neck: No stridor.  No meningeal signs.   Cardiovascular: Normal rate, regular rhythm. Good peripheral circulation. Grossly normal heart sounds. Respiratory: Normal respiratory effort.  No retractions. Lungs CTAB. Gastrointestinal: Soft and nontender. No distention.  Musculoskeletal: No lower extremity tenderness nor edema. No gross deformities of extremities except for the skin injuries as described above.   Neurologic:  Normal speech and language. No gross focal neurologic deficits are appreciated.  Skin:  Skin is warm, dry and intact except for a minor abrasion to the right knee and a small laceration to the anterior right lower leg as described in the procedure section. Psychiatric: Mood and affect are normal. Speech and behavior are normal.  ____________________________________________   LABS (all labs ordered are listed, but only abnormal results are displayed)  Labs Reviewed - No data to display ____________________________________________  EKG  No indication for EKG ____________________________________________  RADIOLOGY   ED MD interpretation: No indication for imaging  Official radiology report(s): No results found.  ____________________________________________   PROCEDURES   Procedure(s) performed (including Critical Care):  Marland KitchenMarland KitchenLaceration Repair Date/Time: 04/15/2018 1:43 AM Performed by: Loleta Rose, MD Authorized by: Loleta Rose, MD   Consent:    Consent obtained:  Verbal    Consent given by:  Patient Anesthesia (see MAR for exact dosages):    Anesthesia method:  None Laceration details:    Location:  Leg   Leg location:  R lower leg   Length (cm):  1 Repair type:    Repair type:  Complex Exploration:    Hemostasis obtained with: Surgicel within the wound.   Contaminated: no   Treatment:    Amount of cleaning:  Standard   Irrigation solution:  Sterile saline   Visualized foreign bodies/material removed: no     Debridement:  Minimal Skin repair:    Repair method:  Steri-Strips   Number of Steri-Strips:  5 Approximation:    Approximation:  Close Post-procedure  details:    Dressing:  Sterile dressing   Patient tolerance of procedure:  Tolerated well, no immediate complications     ____________________________________________   INITIAL IMPRESSION / MDM / ASSESSMENT AND PLAN / ED COURSE  As part of my medical decision making, I reviewed the following data within the electronic MEDICAL RECORD NUMBER Nursing notes reviewed and incorporated and Notes from prior ED visits  Anna Adkins was evaluated in Emergency Department on 04/15/2018 for the symptoms described in the history of present illness. She was evaluated in the context of the global COVID-19 pandemic, which necessitated consideration that the patient might be at risk for infection with the SARS-CoV-2 virus that causes COVID-19. Institutional protocols and algorithms that pertain to the evaluation of patients at risk for COVID-19 are in a state of rapid change based on information released by regulatory bodies including the CDC and federal and state organizations. These policies and algorithms were followed during the patient's care in the ED.      Minor injuries after a fall.  Given her warfarin she bled significantly prior to arrival from the small V-shpaed laceration at the bottom of her right lower extremity but it had stopped bleeding by the time I saw her.  It is a very narrow V shape and I do  not think it would do well with sutures particularly given the fact that she is on warfarin.  After irrigation I closed it with Dermabond and reinforced it with Steri-Strips.  I gave all my usual and customary management recommendations and return precautions.  Given her chronic medical issues which notably includes poor venous return particularly in the affected extremity and the distal location of the wound as well as the fact that it was a bit deep, I am going to give her a few days of antibiotics for prophylaxis against infection.     ____________________________________________  FINAL CLINICAL IMPRESSION(S) / ED DIAGNOSES  Final diagnoses:  Fall, initial encounter  Abrasion of right knee, initial encounter  Laceration of right lower leg, initial encounter     MEDICATIONS GIVEN DURING THIS VISIT:  Medications - No data to display   ED Discharge Orders    None       Note:  This document was prepared using Dragon voice recognition software and may include unintentional dictation errors.   Loleta Rose, MD 04/15/18 1884    Loleta Rose, MD 04/15/18 [phone]

## 2018-10-30 ENCOUNTER — Other Ambulatory Visit: Payer: Self-pay | Admitting: Family Medicine

## 2018-10-30 DIAGNOSIS — Z1231 Encounter for screening mammogram for malignant neoplasm of breast: Secondary | ICD-10-CM

## 2019-01-18 ENCOUNTER — Ambulatory Visit
Admission: RE | Admit: 2019-01-18 | Discharge: 2019-01-18 | Disposition: A | Discharge status: Home / Self Care | Payer: Medicare Other | Source: Ambulatory Visit | Attending: Family Medicine | Admitting: Family Medicine

## 2019-01-18 DIAGNOSIS — Z1231 Encounter for screening mammogram for malignant neoplasm of breast: Secondary | ICD-10-CM | POA: Insufficient documentation

## 2019-04-22 ENCOUNTER — Emergency Department
Admission: EM | Admit: 2019-04-22 | Discharge: 2019-04-22 | Disposition: A | Discharge status: Home / Self Care | Payer: Medicare Other | Attending: Student in an Organized Health Care Education/Training Program | Admitting: Student in an Organized Health Care Education/Training Program

## 2019-04-22 ENCOUNTER — Other Ambulatory Visit: Payer: Self-pay

## 2019-04-22 ENCOUNTER — Encounter: Payer: Self-pay | Admitting: Emergency Medicine

## 2019-04-22 DIAGNOSIS — Z8673 Personal history of transient ischemic attack (TIA), and cerebral infarction without residual deficits: Secondary | ICD-10-CM | POA: Insufficient documentation

## 2019-04-22 DIAGNOSIS — Z79899 Other long term (current) drug therapy: Secondary | ICD-10-CM | POA: Diagnosis not present

## 2019-04-22 DIAGNOSIS — R55 Syncope and collapse: Secondary | ICD-10-CM | POA: Diagnosis not present

## 2019-04-22 DIAGNOSIS — R202 Paresthesia of skin: Secondary | ICD-10-CM | POA: Insufficient documentation

## 2019-04-22 DIAGNOSIS — F17210 Nicotine dependence, cigarettes, uncomplicated: Secondary | ICD-10-CM | POA: Insufficient documentation

## 2019-04-22 DIAGNOSIS — R42 Dizziness and giddiness: Secondary | ICD-10-CM | POA: Diagnosis present

## 2019-04-22 LAB — CBC WITH DIFFERENTIAL/PLATELET
Abs Immature Granulocytes: 0.02 10*3/uL (ref 0.00–0.07)
Basophils Absolute: 0.1 10*3/uL (ref 0.0–0.1)
Basophils Relative: 1 %
Eosinophils Absolute: 0.2 10*3/uL (ref 0.0–0.5)
Eosinophils Relative: 3 %
HCT: 44 % (ref 36.0–46.0)
Hemoglobin: 14.2 g/dL (ref 12.0–15.0)
Immature Granulocytes: 0 %
Lymphocytes Relative: 20 %
Lymphs Abs: 1.2 10*3/uL (ref 0.7–4.0)
MCH: 29.3 pg (ref 26.0–34.0)
MCHC: 32.3 g/dL (ref 30.0–36.0)
MCV: 90.9 fL (ref 80.0–100.0)
Monocytes Absolute: 0.4 10*3/uL (ref 0.1–1.0)
Monocytes Relative: 7 %
Neutro Abs: 4.1 10*3/uL (ref 1.7–7.7)
Neutrophils Relative %: 69 %
Platelets: 226 10*3/uL (ref 150–400)
RBC: 4.84 MIL/uL (ref 3.87–5.11)
RDW: 13.9 % (ref 11.5–15.5)
WBC: 6 10*3/uL (ref 4.0–10.5)
nRBC: 0 % (ref 0.0–0.2)

## 2019-04-22 LAB — TROPONIN I (HIGH SENSITIVITY)
Troponin I (High Sensitivity): 5 ng/L (ref <18)
Troponin I (High Sensitivity): 7 ng/L (ref <18)

## 2019-04-22 LAB — BASIC METABOLIC PANEL
Anion gap: 8 (ref 5–15)
BUN: 8 mg/dL (ref 6–20)
CO2: 28 mmol/L (ref 22–32)
Calcium: 8.9 mg/dL (ref 8.9–10.3)
Chloride: 103 mmol/L (ref 98–111)
Creatinine, Ser: 0.68 mg/dL (ref 0.44–1.00)
GFR calc Af Amer: >60 mL/min (ref >60)
GFR calc non Af Amer: >60 mL/min (ref >60)
Glucose, Bld: 135 mg/dL — ABNORMAL HIGH (ref 70–99)
Potassium: 3.9 mmol/L (ref 3.5–5.1)
Sodium: 139 mmol/L (ref 135–145)

## 2019-04-22 LAB — PROTIME-INR
INR: 2.7 — ABNORMAL HIGH (ref 0.8–1.2)
Prothrombin Time: 28.8 seconds — ABNORMAL HIGH (ref 11.4–15.2)

## 2019-04-22 NOTE — ED Triage Notes (Signed)
Pt states that she was sitting in the recliner around 0330 and on her way back from the bathroom, pt states that she felt a warming sensation throughout her body starting with her feet. Pt states that she called 911 at that time and her dog managed to get out and bit one of the medics on scene. Pt is in NAD.

## 2019-04-22 NOTE — ED Provider Notes (Signed)
[facility] Emergency Department Provider Note    First MD Initiated Contact with Patient 04/22/19 [phone]     (approximate)  I have reviewed the triage vital signs and the nursing notes.   HISTORY  Chief Complaint Hot Flash starting from feet up    HPI Anna Adkins is a 54 y.o. female the below listed past medical history presents to the ER for evaluation of lightheadedness, seeing spots as well as tingling sensation starting in her feet going out to her legs start around 3 AM this morning.  States that she had been sitting in her recliner got up to use the restroom and then by the time she got back she felt like she needed to sit down.  Symptoms lasted briefly and after she sat in a recliner her symptoms resolved.  States that she started feeling symptoms of panic after and called EMS.  She does have a history of PE is on Coumadin.  Denies any nausea or vomiting.  No numbness or tingling.  Denies any headache.  Denies any shortness of breath.    Past Medical History:  Diagnosis Date  . Anxiety   . Anxiety   . Arthritis   . Depression   . Depression   . DVT (deep venous thrombosis) (HCC)   . DVT of leg (deep venous thrombosis) (HCC) 2014  . Pulmonary emboli (HCC)   . TIA (transient ischemic attack) 2014   Family History  Problem Relation Age of Onset  . Healthy Mother   . Heart disease Father   . Diabetes Father   . Asthma Father   . Breast cancer Neg Hx    Past Surgical History:  Procedure Laterality Date  . Blood clot removal    . ECTOPIC PREGNANCY SURGERY    . HERNIA REPAIR    . HERNIA REPAIR  2001  . SKIN GRAFT    . TUBAL LIGATION     Patient Active Problem List   Diagnosis Date Noted  . Acute respiratory failure with hypoxia (HCC) 04/18/2015      Prior to Admission medications   Medication Sig Start Date End Date Taking? Authorizing Provider  Biotin 1000 MCG tablet Take 1,000 mcg by mouth daily.    [provider]    escitalopram (LEXAPRO) 20 MG tablet Take 20 mg by mouth daily.  11/04/17   [provider]  loratadine (CLARITIN) 10 MG tablet Take 10 mg by mouth daily.    [provider]  Multiple Vitamin (MULTIVITAMIN WITH MINERALS) TABS tablet Take 1 tablet by mouth daily.    [provider]  VENTOLIN HFA 108 [phone] Base) MCG/ACT inhaler  08/31/17   [provider]  warfarin (COUMADIN) 4 MG tablet Take 4 mg by mouth every evening.    [provider]    Allergies Sulfa antibiotics    Social History Social History   Tobacco Use  . Smoking status: Current Every Day Smoker  . Smokeless tobacco: Never Used  Substance Use Topics  . Alcohol use: Yes    Alcohol/week: 1.0 standard drinks    Types: 1 Glasses of wine per week    Comment: occasional  . Drug use: No    Review of Systems Patient denies headaches, rhinorrhea, blurry vision, numbness, shortness of breath, chest pain, edema, cough, abdominal pain, nausea, vomiting, diarrhea, dysuria, fevers, rashes or hallucinations unless otherwise stated above in HPI. ____________________________________________   PHYSICAL EXAM:  VITAL SIGNS: Vitals:   04/22/19 9147  BP: 107/64  Pulse: 76  Resp: 18  Temp: 98.6 F (37 C)  SpO2: 94%    Constitutional: Alert and oriented.  Eyes: Conjunctivae are normal.  Head: Atraumatic. Nose: No congestion/rhinnorhea. Mouth/Throat: Mucous membranes are moist.   Neck: No stridor. Painless ROM.  Cardiovascular: Normal rate, regular rhythm. Grossly normal heart sounds.  Good peripheral circulation. Respiratory: Normal respiratory effort.  No retractions. Lungs CTAB. Gastrointestinal: Soft and nontender. No distention. No abdominal bruits. No CVA tenderness. Genitourinary:  Musculoskeletal: No lower extremity tenderness nor edema.  No joint effusions. Neurologic:  Normal speech and language. No gross focal neurologic deficits are appreciated. No facial droop Skin:   Skin is warm, dry and intact. No rash noted. Psychiatric: Mood and affect are normal. Speech and behavior are normal.  ____________________________________________   LABS (all labs ordered are listed, but only abnormal results are displayed)  Results for orders placed or performed during the hospital encounter of 04/22/19 (from the past 24 hour(s))  Troponin I (High Sensitivity)     Status: None   Collection Time: 04/22/19  6:43 AM  Result Value Ref Range   Troponin I (High Sensitivity) 5 <18 ng/L  CBC with Differential     Status: None   Collection Time: 04/22/19  6:43 AM  Result Value Ref Range   WBC 6.0 4.0 - 10.5 K/uL   RBC 4.84 3.87 - 5.11 MIL/uL   Hemoglobin 14.2 12.0 - 15.0 g/dL   HCT 33.8 25.0 - 53.9 %   MCV 90.9 80.0 - 100.0 fL   MCH 29.3 26.0 - 34.0 pg   MCHC 32.3 30.0 - 36.0 g/dL   RDW 76.7 34.1 - 93.7 %   Platelets 226 150 - 400 K/uL   nRBC 0.0 0.0 - 0.2 %   Neutrophils Relative % 69 %   Neutro Abs 4.1 1.7 - 7.7 K/uL   Lymphocytes Relative 20 %   Lymphs Abs 1.2 0.7 - 4.0 K/uL   Monocytes Relative 7 %   Monocytes Absolute 0.4 0.1 - 1.0 K/uL   Eosinophils Relative 3 %   Eosinophils Absolute 0.2 0.0 - 0.5 K/uL   Basophils Relative 1 %   Basophils Absolute 0.1 0.0 - 0.1 K/uL   Immature Granulocytes 0 %   Abs Immature Granulocytes 0.02 0.00 - 0.07 K/uL  Basic metabolic panel     Status: Abnormal   Collection Time: 04/22/19  6:43 AM  Result Value Ref Range   Sodium 139 135 - 145 mmol/L   Potassium 3.9 3.5 - 5.1 mmol/L   Chloride 103 98 - 111 mmol/L   CO2 28 22 - 32 mmol/L   Glucose, Bld 135 (H) 70 - 99 mg/dL   BUN 8 6 - 20 mg/dL   Creatinine, Ser 9.02 0.44 - 1.00 mg/dL   Calcium 8.9 8.9 - 40.9 mg/dL   GFR calc non Af Amer >60 >60 mL/min   GFR calc Af Amer >60 >60 mL/min   Anion gap 8 5 - 15  Troponin I (High Sensitivity)     Status: None   Collection Time: 04/22/19  8:27 AM  Result Value Ref Range   Troponin I (High Sensitivity) 7 <18 ng/L  Protime-INR      Status: Abnormal   Collection Time: 04/22/19  8:27 AM  Result Value Ref Range   Prothrombin Time 28.8 (H) 11.4 - 15.2 seconds   INR 2.7 (H) 0.8 - 1.2   ____________________________________________  EKG My review and personal interpretation at Time: 6:27  Indication: hot flash  Rate: 90  Rhythm: sinus Axis: normal Other: no stemi,  ____________________________________________  RADIOLOGY   ____________________________________________   PROCEDURES  Procedure(s) performed:  Procedures    Critical Care performed: no ____________________________________________   INITIAL IMPRESSION / ASSESSMENT AND PLAN / ED COURSE  Pertinent labs & imaging results that were available during my care of the patient were reviewed by me and considered in my medical decision making (see chart for details).   DDX: Near-syncope, dehydration, electrolyte abnormality, dysrhythmia, hypoglycemia, anemia, ACS  ANGE PUSKAS is a 54 y.o. who presents to the ED with symptoms as described above.  Patient arrives well-appearing in no acute distress.  She is afebrile and hemodynamically stable.  Exam and history is reassuring and suggest more of a near syncope type of picture.  Did not fully lose consciousness.  Is not consistent with TIA or CVA.  Her neuro exam is intact.  EKG without any evidence of excitation syndrome or ischemia.  Initial troponin is negative.  Does not seem consistent with PE or DVT and she is on Coumadin.     The patient was evaluated in Emergency Department today for the symptoms described in the history of present illness. He/she was evaluated in the context of the global COVID-19 pandemic, which necessitated consideration that the patient might be at risk for infection with the SARS-CoV-2 virus that causes COVID-19. Institutional protocols and algorithms that pertain to the evaluation of patients at risk for COVID-19 are in a state of rapid change based on information released by  regulatory bodies including the CDC and federal and state organizations. These policies and algorithms were followed during the patient's care in the ED.  As part of my medical decision making, I reviewed the following data within the [facility] notes reviewed and incorporated, Labs reviewed, notes from prior ED visits and [place] Controlled Substance Database   ____________________________________________   FINAL CLINICAL IMPRESSION(S) / ED DIAGNOSES  Final diagnoses:  Near syncope      NEW MEDICATIONS STARTED DURING THIS VISIT:  New Prescriptions   No medications on file     Note:  This document was prepared using Dragon voice recognition software and may include unintentional dictation errors.    Merlyn Lot, MD 04/22/19 [phone]

## 2019-11-06 ENCOUNTER — Ambulatory Visit (INDEPENDENT_AMBULATORY_CARE_PROVIDER_SITE_OTHER): Payer: Medicare Other | Admitting: Vascular Surgery

## 2019-11-06 ENCOUNTER — Encounter (INDEPENDENT_AMBULATORY_CARE_PROVIDER_SITE_OTHER): Payer: Self-pay | Admitting: Vascular Surgery

## 2019-11-06 ENCOUNTER — Other Ambulatory Visit: Payer: Self-pay

## 2019-11-06 VITALS — BP 119/74 | HR 92 | Resp 16 | Ht 68.0 in | Wt 275.0 lb

## 2019-11-06 DIAGNOSIS — I87009 Postthrombotic syndrome without complications of unspecified extremity: Secondary | ICD-10-CM | POA: Diagnosis not present

## 2019-11-06 DIAGNOSIS — Z9889 Other specified postprocedural states: Secondary | ICD-10-CM | POA: Diagnosis not present

## 2019-11-06 DIAGNOSIS — I745 Embolism and thrombosis of iliac artery: Secondary | ICD-10-CM | POA: Diagnosis not present

## 2019-11-06 DIAGNOSIS — I2699 Other pulmonary embolism without acute cor pulmonale: Secondary | ICD-10-CM

## 2019-11-06 DIAGNOSIS — Q211 Atrial septal defect: Secondary | ICD-10-CM | POA: Diagnosis not present

## 2019-11-06 DIAGNOSIS — Q2112 Patent foramen ovale: Secondary | ICD-10-CM

## 2019-11-06 DIAGNOSIS — M7989 Other specified soft tissue disorders: Secondary | ICD-10-CM

## 2019-11-06 NOTE — Progress Notes (Signed)
Patient ID: Anna Adkins, female   DOB: 01/11/1966, 54 y.o.   MRN: [id number]  Chief Complaint  Patient presents with  . New Patient (Initial Visit)    ref Headrick venous stasis    HPI [name] is a 54 y.o. female.  I am asked to see the patient by E. Headrick, NP for evaluation of swelling, discoloration, and pain of the lower extremities. The patient has a history of what sounds like a severe DVT and pulmonary embolus back in 2014. In addition to her venous complications, a patent foramen ovale led to arterial ischemia to both lower extremities. She ultimately required fasciotomies and revascularization in addition to long-term anticoagulation. She remains on Coumadin today. She has a lot of skin rashes that have been somewhat attributed to her Coumadin. She has darkening discoloration of both legs worse on the right than the left. She says that is the side that the clot started on. She reports no open wounds but she has had many rashes and concerns for infection in both legs worse on the right. There is chronic swelling which seems to be getting worse. The legs are very heavy and painful. No current chest pain or shortness of breath but she dealt with the effects for pulmonary embolus for some time. No fever or chills.     Past Medical History:  Diagnosis Date  . Anxiety   . Anxiety   . Arthritis   . Depression   . Depression   . DVT (deep venous thrombosis) (HCC)   . DVT of leg (deep venous thrombosis) (HCC) 2014  . Pulmonary emboli (HCC)   . TIA (transient ischemic attack) 2014    Past Surgical History:  Procedure Laterality Date  . Blood clot removal    . ECTOPIC PREGNANCY SURGERY    . HERNIA REPAIR    . HERNIA REPAIR  2001  . SKIN GRAFT    . TUBAL LIGATION       Family History  Problem Relation Age of Onset  . Healthy Mother   . Heart disease Father   . Diabetes Father   . Asthma Father   . Breast cancer Neg Hx      Social History   Tobacco Use  .  Smoking status: Current Every Day Smoker  . Smokeless tobacco: Never Used  Vaping Use  . Vaping Use: Never used  Substance Use Topics  . Alcohol use: Yes    Alcohol/week: 1.0 standard drink    Types: 1 Glasses of wine per week    Comment: occasional  . Drug use: No    Allergies  Allergen Reactions  . Sulfa Antibiotics Anaphylaxis    Current Outpatient Medications  Medication Sig Dispense Refill  . acetaminophen (TYLENOL) 500 MG tablet Take 500 mg by mouth every 6 (six) hours as needed.    [name] escitalopram (LEXAPRO) 20 MG tablet Take 20 mg by mouth daily.     . fluticasone (FLONASE) 50 MCG/ACT nasal spray Place 1 spray into both nostrils daily.    [name] loratadine (CLARITIN) 10 MG tablet Take 10 mg by mouth daily.    . Multiple Vitamin (MULTIVITAMIN WITH MINERALS) TABS tablet Take 1 tablet by mouth daily.    . VENTOLIN HFA 108 (90 Base) MCG/ACT inhaler     . warfarin (COUMADIN) 4 MG tablet Take 4 mg by mouth every evening.    . Biotin 1000 MCG tablet Take 1,000 mcg by mouth daily. (Patient not taking: Reported on  11/06/2019)     No current facility-administered medications for this visit.      REVIEW OF SYSTEMS (Negative unless checked)  Constitutional: [] Weight loss  [] Fever  [] Chills Cardiac: [] Chest pain   [] Chest pressure   [] Palpitations   [] Shortness of breath when laying flat   [] Shortness of breath at rest   [] Shortness of breath with exertion. Vascular:  [x] Pain in legs with walking   [x] Pain in legs at rest   [] Pain in legs when laying flat   [] Claudication   [] Pain in feet when walking  [] Pain in feet at rest  [] Pain in feet when laying flat   [x] History of DVT   [x] Phlebitis   [x] Swelling in legs   [x] Varicose veins   [] Non-healing ulcers Pulmonary:   [] Uses home oxygen   [] Productive cough   [] Hemoptysis   [] Wheeze  [] COPD   [] Asthma Neurologic:  [] Dizziness  [] Blackouts   [] Seizures   [] History of stroke   [] History of TIA  [] Aphasia   [] Temporary blindness   [] Dysphagia    [] Weakness or numbness in arms   [] Weakness or numbness in legs Musculoskeletal:  [x] Arthritis   [] Joint swelling   [] Joint pain   [] Low back pain Hematologic:  [] Easy bruising  [] Easy bleeding   [] Hypercoagulable state   [] Anemic  [] Hepatitis Gastrointestinal:  [] Blood in stool   [] Vomiting blood  [] Gastroesophageal reflux/heartburn   [] Abdominal pain Genitourinary:  [] Chronic kidney disease   [] Difficult urination  [] Frequent urination  [] Burning with urination   [] Hematuria Skin:  [x] Rashes   [] Ulcers   [] Wounds Psychological:  [x] History of anxiety   [x]  History of major depression.    Physical Exam BP 119/74 (BP Location: Right Arm)   Pulse 92   Resp 16   Ht 5\' 8"  (1.727 m)   Wt 275 lb (124.7 kg)   BMI 41.81 kg/m  Gen:  WD/WN, NAD Head: [place]/AT, No temporalis wasting.  Ear/Nose/Throat: Hearing grossly intact, nares w/o erythema or drainage, oropharynx w/o Erythema/Exudate Eyes: Conjunctiva clear, sclera non-icteric  Neck: trachea midline.  No JVD.  Pulmonary:  Good air movement, respirations not labored, no use of accessory muscles  Cardiac: RRR, no JVD Vascular:  Vessel Right Left  Radial Palpable Palpable                          DP 1+ 1+  PT NP 1+   Gastrointestinal:. No masses, surgical incisions, or scars. Musculoskeletal: M/S 5/5 throughout.  Extremities without ischemic changes.  No deformity or atrophy. 2+ right lower extremity edema, 1+ left lower extremity edema. Marked stasis dermatitis changes present on the right with mild to moderate stasis dermatitis changes present on the left. Neurologic: Sensation grossly intact in extremities.  Symmetrical.  Speech is fluent. Motor exam as listed above. Psychiatric: Judgment intact, Mood & affect appropriate for pt's clinical situation. Dermatologic: Significant rash separate from her stasis dermatitis changes present worse on the right leg than the left. She does also have some petechial areas that could be some sort  of Coumadin skin issue.    Radiology No results found.  Labs No results found for this or any previous visit (from the past 2160 hour(s)).  Assessment/Plan:  H/O fasciotomy Fasciotomy incisions well-healed bilaterally, but certainly previous major urgent surgery such of this could worsen lymphedema from scarring of the lymphatic channels.  Patent foramen ovale with right to left shunt Apparently developed arterial insufficiency of the lower extremities in addition to DVT due  to her PFO.  Had emergency surgery back in 2014.  Post-phlebitic syndrome The patient has fairly pronounced postphlebitic changes to the right leg. She has marked stasis dermatitis and significant swelling. She has some significant changes on the left although not nearly as severe. Compression, elevation, and a venous study will be done in the near future at her convenience.  Iliac artery occlusion, bilateral (HCC) She describes this is being a paradoxical embolus after a DVT in her lower extremities with patent foramen ovale. Ultimately required fasciotomies and revascularization.  Other pulmonary embolism without acute cor pulmonale (HCC) This was back in 2014. This was severe and life-threatening. She remains on anticoagulation. I did discuss the Coumadin can be contributing to some of her skin changes and consideration to switch that to one of the newer agents can certainly be given. She is going to discuss this with her primary care physician.  Swelling of limb The patient has marked venous stasis changes, swelling, and clearly has postphlebitic syndrome. Were going to obtain a venous reflux study to assess her venous anatomy in both lower extremities. Compression stockings worn daily, leg elevation, and increasing activity are of significant importance to try to improve her symptoms. Based on her reflux study we may consider some sort of venous intervention although if it is all deep venous reflux, no  intervention will be helpful. She may benefit from a lymphedema pump and we will help determine this after her reflux study.      Festus Barren 11/06/2019, 4:50 PM   This note was created with Dragon medical transcription system.  Any errors from dictation are unintentional.

## 2019-11-06 NOTE — Patient Instructions (Signed)
Edema  Edema is when you have too much fluid in your body or under your skin. Edema may make your legs, feet, and ankles swell up. Swelling is also common in looser tissues, like around your eyes. This is a common condition. It gets more common as you get older. There are many possible causes of edema. Eating too much salt (sodium) and being on your feet or sitting for a long time can cause edema in your legs, feet, and ankles. Hot weather may make edema worse. Edema is usually painless. Your skin may look swollen or shiny. Follow these instructions at home:  Keep the swollen body part raised (elevated) above the level of your heart when you are sitting or lying down.  Do not sit still or stand for a long time.  Do not wear tight clothes. Do not wear garters on your upper legs.  Exercise your legs. This can help the swelling go down.  Wear elastic bandages or support stockings as told by your doctor.  Eat a low-salt (low-sodium) diet to reduce fluid as told by your doctor.  Depending on the cause of your swelling, you may need to limit how much fluid you drink (fluid restriction).  Take over-the-counter and prescription medicines only as told by your doctor. Contact a doctor if:  Treatment is not working.  You have heart, liver, or kidney disease and have symptoms of edema.  You have sudden and unexplained weight gain. Get help right away if:  You have shortness of breath or chest pain.  You cannot breathe when you lie down.  You have pain, redness, or warmth in the swollen areas.  You have heart, liver, or kidney disease and get edema all of a sudden.  You have a fever and your symptoms get worse all of a sudden. Summary  Edema is when you have too much fluid in your body or under your skin.  Edema may make your legs, feet, and ankles swell up. Swelling is also common in looser tissues, like around your eyes.  Raise (elevate) the swollen body part above the level of your  heart when you are sitting or lying down.  Follow your doctor's instructions about diet and how much fluid you can drink (fluid restriction). This information is not intended to replace advice given to you by your health care provider. Make sure you discuss any questions you have with your health care provider. Document Revised: 12/31/2016 Document Reviewed: 01/16/2016 Elsevier Patient Education  2020 [employer]  

## 2019-11-06 NOTE — Assessment & Plan Note (Signed)
The patient has marked venous stasis changes, swelling, and clearly has postphlebitic syndrome. Were going to obtain a venous reflux study to assess her venous anatomy in both lower extremities. Compression stockings worn daily, leg elevation, and increasing activity are of significant importance to try to improve her symptoms. Based on her reflux study we may consider some sort of venous intervention although if it is all deep venous reflux, no intervention will be helpful. She may benefit from a lymphedema pump and we will help determine this after her reflux study.

## 2019-11-06 NOTE — Assessment & Plan Note (Signed)
This was back in 2014. This was severe and life-threatening. She remains on anticoagulation. I did discuss the Coumadin can be contributing to some of her skin changes and consideration to switch that to one of the newer agents can certainly be given. She is going to discuss this with her primary care physician.

## 2019-11-06 NOTE — Assessment & Plan Note (Signed)
She describes this is being a paradoxical embolus after a DVT in her lower extremities with patent foramen ovale. Ultimately required fasciotomies and revascularization.

## 2019-11-06 NOTE — Assessment & Plan Note (Signed)
Fasciotomy incisions well-healed bilaterally, but certainly previous major urgent surgery such of this could worsen lymphedema from scarring of the lymphatic channels.

## 2019-11-06 NOTE — Assessment & Plan Note (Signed)
Apparently developed arterial insufficiency of the lower extremities in addition to DVT due to her PFO.  Had emergency surgery back in 2014.

## 2019-11-06 NOTE — Assessment & Plan Note (Signed)
The patient has fairly pronounced postphlebitic changes to the right leg. She has marked stasis dermatitis and significant swelling. She has some significant changes on the left although not nearly as severe. Compression, elevation, and a venous study will be done in the near future at her convenience.

## 2019-11-13 ENCOUNTER — Other Ambulatory Visit: Payer: Self-pay

## 2019-11-13 ENCOUNTER — Ambulatory Visit (INDEPENDENT_AMBULATORY_CARE_PROVIDER_SITE_OTHER): Payer: Medicare Other

## 2019-11-13 ENCOUNTER — Ambulatory Visit (INDEPENDENT_AMBULATORY_CARE_PROVIDER_SITE_OTHER): Payer: Medicare Other | Admitting: Vascular Surgery

## 2019-11-13 ENCOUNTER — Encounter (INDEPENDENT_AMBULATORY_CARE_PROVIDER_SITE_OTHER): Payer: Self-pay | Admitting: Vascular Surgery

## 2019-11-13 VITALS — BP 133/84 | HR 93 | Resp 16 | Wt 272.0 lb

## 2019-11-13 DIAGNOSIS — I745 Embolism and thrombosis of iliac artery: Secondary | ICD-10-CM | POA: Diagnosis not present

## 2019-11-13 DIAGNOSIS — I890 Lymphedema, not elsewhere classified: Secondary | ICD-10-CM

## 2019-11-13 DIAGNOSIS — R600 Localized edema: Secondary | ICD-10-CM

## 2019-11-13 DIAGNOSIS — I87009 Postthrombotic syndrome without complications of unspecified extremity: Secondary | ICD-10-CM

## 2019-11-13 DIAGNOSIS — I2699 Other pulmonary embolism without acute cor pulmonale: Secondary | ICD-10-CM

## 2019-11-13 DIAGNOSIS — Q211 Atrial septal defect: Secondary | ICD-10-CM

## 2019-11-13 DIAGNOSIS — M7989 Other specified soft tissue disorders: Secondary | ICD-10-CM

## 2019-11-13 DIAGNOSIS — Q2112 Patent foramen ovale: Secondary | ICD-10-CM

## 2019-11-13 NOTE — Progress Notes (Signed)
MRN : [id number]  Anna Adkins is a 54 y.o. (05-Jan-1966) female who presents with chief complaint of  Chief Complaint  Patient presents with   Follow-up    ultrasound follow up  .  History of Present Illness: Patient returns today in follow up of her swelling, pain, and venous stasis changes.  She tried wearing compression stockings but they seem to irritate her legs more and her skin was quite red and irritated when that started.  It is a little better today but still noticeable.  She was concerned when her right leg became more irritated and returns earlier than her scheduled visit.  A venous study was done today.  There was no evidence of DVT or superficial thrombophlebitis.  She had no significant superficial venous reflux on the right leg.  On the left leg, she did have venous reflux in the left great saphenous vein.  Current Outpatient Medications  Medication Sig Dispense Refill   acetaminophen (TYLENOL) 500 MG tablet Take 500 mg by mouth every 6 (six) hours as needed.     escitalopram (LEXAPRO) 20 MG tablet Take 20 mg by mouth daily.      fluticasone (FLONASE) 50 MCG/ACT nasal spray Place 1 spray into both nostrils daily.     loratadine (CLARITIN) 10 MG tablet Take 10 mg by mouth daily.     Multiple Vitamin (MULTIVITAMIN WITH MINERALS) TABS tablet Take 1 tablet by mouth daily.     VENTOLIN HFA 108 (90 Base) MCG/ACT inhaler      warfarin (COUMADIN) 4 MG tablet Take 4 mg by mouth every evening.     Biotin 1000 MCG tablet Take 1,000 mcg by mouth daily. (Patient not taking: Reported on 11/06/2019)     No current facility-administered medications for this visit.    Past Medical History:  Diagnosis Date   Anxiety    Anxiety    Arthritis    Depression    Depression    DVT (deep venous thrombosis) (HCC)    DVT of leg (deep venous thrombosis) (HCC) 2014   Pulmonary emboli (HCC)    TIA (transient ischemic attack) 2014    Past Surgical History:  Procedure  Laterality Date   Blood clot removal     ECTOPIC PREGNANCY SURGERY     HERNIA REPAIR     HERNIA REPAIR  2001   SKIN GRAFT     TUBAL LIGATION       Social History   Tobacco Use   Smoking status: Current Every Day Smoker   Smokeless tobacco: Never Used  Vaping Use   Vaping Use: Never used  Substance Use Topics   Alcohol use: Yes    Alcohol/week: 1.0 standard drink    Types: 1 Glasses of wine per week    Comment: occasional   Drug use: No      Family History  Problem Relation Age of Onset   Healthy Mother    Heart disease Father    Diabetes Father    Asthma Father    Breast cancer Neg Hx      Allergies  Allergen Reactions   Sulfa Antibiotics Anaphylaxis    REVIEW OF SYSTEMS (Negative unless checked)  Constitutional: [] ?Weight loss  [] ?Fever  [] ?Chills Cardiac: [] ?Chest pain   [] ?Chest pressure   [] ?Palpitations   [] ?Shortness of breath when laying flat   [] ?Shortness of breath at rest   [] ?Shortness of breath with exertion. Vascular:  [x] ?Pain in legs with walking   [x] ?Pain in legs  at rest   [] ?Pain in legs when laying flat   [] ?Claudication   [] ?Pain in feet when walking  [] ?Pain in feet at rest  [] ?Pain in feet when laying flat   [x] ?History of DVT   [x] ?Phlebitis   [x] ?Swelling in legs   [x] ?Varicose veins   [] ?Non-healing ulcers Pulmonary:   [] ?Uses home oxygen   [] ?Productive cough   [] ?Hemoptysis   [] ?Wheeze  [] ?COPD   [] ?Asthma Neurologic:  [] ?Dizziness  [] ?Blackouts   [] ?Seizures   [] ?History of stroke   [] ?History of TIA  [] ?Aphasia   [] ?Temporary blindness   [] ?Dysphagia   [] ?Weakness or numbness in arms   [] ?Weakness or numbness in legs Musculoskeletal:  [x] ?Arthritis   [] ?Joint swelling   [] ?Joint pain   [] ?Low back pain Hematologic:  [] ?Easy bruising  [] ?Easy bleeding   [] ?Hypercoagulable state   [] ?Anemic  [] ?Hepatitis Gastrointestinal:  [] ?Blood in stool   [] ?Vomiting blood  [] ?Gastroesophageal reflux/heartburn   [] ?Abdominal  pain Genitourinary:  [] ?Chronic kidney disease   [] ?Difficult urination  [] ?Frequent urination  [] ?Burning with urination   [] ?Hematuria Skin:  [x] ?Rashes   [] ?Ulcers   [] ?Wounds Psychological:  [x] ?History of anxiety   [x] ? History of major depression.  Physical Examination  BP 133/84 (BP Location: Right Arm)    Pulse 93    Resp 16    Wt 272 lb (123.4 kg)    BMI 41.36 kg/m  Gen:  WD/WN, NAD Head: [place]/AT, No temporalis wasting. Ear/Nose/Throat: Hearing grossly intact, nares w/o erythema or drainage Eyes: Conjunctiva clear. Sclera non-icteric Neck: Supple.  Trachea midline Pulmonary:  Good air movement, no use of accessory muscles.  Cardiac: RRR, no JVD Vascular:  Vessel Right Left  Radial Palpable Palpable                          PT 1+ Palpable 1+ Palpable  DP Not Palpable 1+ Palpable    Musculoskeletal: M/S 5/5 throughout.  No deformity or atrophy. 2+ RLE edema, 1+ LLE edema. Marked stasis dermatitis changes present on the right and mild to moderate stasis changes present on the left. Neurologic: Sensation grossly intact in extremities.  Symmetrical.  Speech is fluent.  Psychiatric: Judgment intact, Mood & affect appropriate for pt's clinical situation. Dermatologic: Marked stasis dermatitis changes with some rash on both lower extremities worse on the right than the left.       Labs No results found for this or any previous visit (from the past 2160 hour(s)).  Radiology No results found.  Assessment/Plan H/O fasciotomy Fasciotomy incisions well-healed bilaterally, but certainly previous major urgent surgery such of this could worsen lymphedema from scarring of the lymphatic channels.  Patent foramen ovale with right to left shunt Apparently developed arterial insufficiency of the lower extremities in addition to DVT due to her PFO.  Had emergency surgery back in 2014.  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 She has marked stasis dermatitis and significant swelling. She has some significant changes on the left although not nearly as severe. Compression, elevation, and a venous study will be done in the near future at her convenience.  Iliac artery occlusion, bilateral (HCC) She describes this is being a paradoxical embolus after a DVT in her lower extremities with patent foramen ovale. Ultimately required fasciotomies and revascularization.  Other pulmonary embolism without acute cor pulmonale (HCC) This was back in 2014. This was severe and life-threatening. She remains on anticoagulation. I did discuss the  Coumadin can be contributing to some of her skin changes and consideration to switch that to one of the newer agents can certainly be given. She is going to discuss this with her primary care physician.  Lymphedema There was no evidence of DVT or superficial thrombophlebitis.  She had no significant superficial venous reflux on the right leg.  On the left leg, she did have venous reflux in the left great saphenous vein. 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  Both legs are involved.  At this point, in addition to compression stockings, elevation, and increasing her activity level, a lymphedema pump would be an excellent adjuvant therapy to help her symptoms.  We will try to get this obtained.  We will see her back in 3 months.     Festus Barren, MD  11/13/2019 2:35 PM    This note was created with Dragon medical transcription system.  Any errors from dictation are purely unintentional

## 2019-11-13 NOTE — Patient Instructions (Signed)

## 2019-11-13 NOTE — Assessment & Plan Note (Signed)
There was no evidence of DVT or superficial thrombophlebitis.  She had no significant superficial venous reflux on the right leg.  On the left leg, she did have venous reflux in the left great saphenous vein. 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  Both legs are involved.  At this point, in addition to compression stockings, elevation, and increasing her activity level, a lymphedema pump would be an excellent adjuvant therapy to help her symptoms.  We will try to get this obtained.  We will see her back in 3 months.

## 2019-11-22 ENCOUNTER — Encounter (INDEPENDENT_AMBULATORY_CARE_PROVIDER_SITE_OTHER): Payer: Medicare Other

## 2019-11-22 ENCOUNTER — Ambulatory Visit (INDEPENDENT_AMBULATORY_CARE_PROVIDER_SITE_OTHER): Payer: Medicare Other | Admitting: Nurse Practitioner

## 2019-11-23 ENCOUNTER — Telehealth (INDEPENDENT_AMBULATORY_CARE_PROVIDER_SITE_OTHER): Payer: Self-pay

## 2019-11-23 NOTE — Telephone Encounter (Signed)
This was received from [facility] at [employer]:  Patient MRN [id number]: We have been calling her since last week, but with no answer. We were finally able to reach her today and scheduled to see her, but she let me know she had called your office yesterday about not hearing from [place]. I explained that we had been calling and she said "oh".  Just wanted to let you know, we've been calling her for 9 days

## 2019-11-28 ENCOUNTER — Encounter (INDEPENDENT_AMBULATORY_CARE_PROVIDER_SITE_OTHER): Payer: Medicare Other

## 2019-11-28 ENCOUNTER — Ambulatory Visit (INDEPENDENT_AMBULATORY_CARE_PROVIDER_SITE_OTHER): Payer: Medicare Other | Admitting: Nurse Practitioner

## 2020-02-19 ENCOUNTER — Ambulatory Visit (INDEPENDENT_AMBULATORY_CARE_PROVIDER_SITE_OTHER): Payer: Medicare Other | Admitting: Vascular Surgery

## 2020-05-09 ENCOUNTER — Other Ambulatory Visit: Payer: Self-pay | Admitting: Physician Assistant

## 2020-05-09 DIAGNOSIS — Z1231 Encounter for screening mammogram for malignant neoplasm of breast: Secondary | ICD-10-CM

## 2020-05-14 ENCOUNTER — Emergency Department
Admission: EM | Admit: 2020-05-14 | Discharge: 2020-05-16 | Disposition: A | Discharge status: Other Acute Inpatient Hospital | Payer: Medicare Other | Attending: Emergency Medicine | Admitting: Emergency Medicine

## 2020-05-14 ENCOUNTER — Other Ambulatory Visit: Payer: Self-pay

## 2020-05-14 ENCOUNTER — Emergency Department: Payer: Medicare Other

## 2020-05-14 DIAGNOSIS — Z20822 Contact with and (suspected) exposure to covid-19: Secondary | ICD-10-CM | POA: Insufficient documentation

## 2020-05-14 DIAGNOSIS — Z046 Encounter for general psychiatric examination, requested by authority: Secondary | ICD-10-CM | POA: Diagnosis present

## 2020-05-14 DIAGNOSIS — F17200 Nicotine dependence, unspecified, uncomplicated: Secondary | ICD-10-CM | POA: Insufficient documentation

## 2020-05-14 DIAGNOSIS — Z7901 Long term (current) use of anticoagulants: Secondary | ICD-10-CM | POA: Diagnosis not present

## 2020-05-14 DIAGNOSIS — F23 Brief psychotic disorder: Secondary | ICD-10-CM

## 2020-05-14 DIAGNOSIS — F29 Unspecified psychosis not due to a substance or known physiological condition: Secondary | ICD-10-CM | POA: Diagnosis not present

## 2020-05-14 DIAGNOSIS — Z79899 Other long term (current) drug therapy: Secondary | ICD-10-CM | POA: Diagnosis not present

## 2020-05-14 LAB — COMPREHENSIVE METABOLIC PANEL
ALT: 18 U/L (ref 0–44)
AST: 23 U/L (ref 15–41)
Albumin: 4.2 g/dL (ref 3.5–5.0)
Alkaline Phosphatase: 49 U/L (ref 38–126)
Anion gap: 12 (ref 5–15)
BUN: 10 mg/dL (ref 6–20)
CO2: 23 mmol/L (ref 22–32)
Calcium: 9.5 mg/dL (ref 8.9–10.3)
Chloride: 103 mmol/L (ref 98–111)
Creatinine, Ser: 0.64 mg/dL (ref 0.44–1.00)
GFR, Estimated: >60 mL/min (ref >60)
Glucose, Bld: 121 mg/dL — ABNORMAL HIGH (ref 70–99)
Potassium: 3.3 mmol/L — ABNORMAL LOW (ref 3.5–5.1)
Sodium: 138 mmol/L (ref 135–145)
Total Bilirubin: 1 mg/dL (ref 0.3–1.2)
Total Protein: 7.8 g/dL (ref 6.5–8.1)

## 2020-05-14 LAB — URINE DRUG SCREEN, QUALITATIVE (ARMC ONLY)
Amphetamines, Ur Screen: NOT DETECTED
Barbiturates, Ur Screen: NOT DETECTED
Benzodiazepine, Ur Scrn: NOT DETECTED
Cannabinoid 50 Ng, Ur ~~LOC~~: NOT DETECTED
Cocaine Metabolite,Ur ~~LOC~~: NOT DETECTED
MDMA (Ecstasy)Ur Screen: NOT DETECTED
Methadone Scn, Ur: NOT DETECTED
Opiate, Ur Screen: NOT DETECTED
Phencyclidine (PCP) Ur S: NOT DETECTED
Tricyclic, Ur Screen: NOT DETECTED

## 2020-05-14 LAB — URINALYSIS, COMPLETE (UACMP) WITH MICROSCOPIC
Bilirubin Urine: NEGATIVE
Glucose, UA: NEGATIVE mg/dL
Hgb urine dipstick: NEGATIVE
Ketones, ur: 20 mg/dL — AB
Leukocytes,Ua: NEGATIVE
Nitrite: NEGATIVE
Protein, ur: 30 mg/dL — AB
Specific Gravity, Urine: 1.009 (ref 1.005–1.030)
pH: 6 (ref 5.0–8.0)

## 2020-05-14 LAB — CBC
HCT: 42.8 % (ref 36.0–46.0)
Hemoglobin: 13.8 g/dL (ref 12.0–15.0)
MCH: 29.4 pg (ref 26.0–34.0)
MCHC: 32.2 g/dL (ref 30.0–36.0)
MCV: 91.3 fL (ref 80.0–100.0)
Platelets: 218 10*3/uL (ref 150–400)
RBC: 4.69 MIL/uL (ref 3.87–5.11)
RDW: 14.6 % (ref 11.5–15.5)
WBC: 9.7 10*3/uL (ref 4.0–10.5)
nRBC: 0 % (ref 0.0–0.2)

## 2020-05-14 LAB — SALICYLATE LEVEL: Salicylate Lvl: <7 mg/dL — ABNORMAL LOW (ref 7.0–30.0)

## 2020-05-14 LAB — RESP PANEL BY RT-PCR (FLU A&B, COVID) ARPGX2
Influenza A by PCR: NEGATIVE
Influenza B by PCR: NEGATIVE
SARS Coronavirus 2 by RT PCR: NEGATIVE

## 2020-05-14 LAB — ACETAMINOPHEN LEVEL: Acetaminophen (Tylenol), Serum: <10 ug/mL — ABNORMAL LOW (ref 10–30)

## 2020-05-14 LAB — ETHANOL: Alcohol, Ethyl (B): <10 mg/dL (ref <10)

## 2020-05-14 NOTE — BH Assessment (Signed)
Comprehensive Clinical Assessment (CCA) Note  05/14/2020 Anna Adkins [id number]  Chief Complaint: Patient is a 55 year old female presenting to [facility] ED under IVC. Per triage note Pt comes EMS from home with AMS. Pt's mom denies psych hx but pt talking in tangential and word salad sentences. Talking about her "timeline" and how she can't mess it up by talking. Guarded but allows [place] to get her dressed out. Per ems, pt was much more upset and it took them about an hour to get her to come with them. During assessment patient appeared alert and oriented x4, calm and cooperative, mood appears pleasant. Patient reports "I haven't been resting well, me and my daughter, she's in college, we went to [place] to spend time with my mom's sister, my Mom drove [place] back and I laid down." "I thought it was a dream at first, I thought I was still sleeping, I felt worn out." Patient reports that she is currently on medication for both her mental health and physical health and reports taking her medications as prescribed. Patient reports being diagnosed with Depression and Anxiety and is currently being seen at [facility] by a psychiatrist. Patient reports that she started "Feeling drowsy yesterday." "It's like when I got up, I had already been through that situation, the timeline of my life had been off, it felt like Deja vu." Patient reports that when EMS arrived she tried to explain herself "but I didn't want to mess up their timeline." Patient denies any recent new medication being added and reports most recent medication change was "Eliquis I started that more than a month ago." Patient denies using any drugs or alcohol. Patient UDS is negative. Compared to patient's triage note when she first arrived patient does communicate more clear but still at times has a hard time explaining what she is experiencing. Patient denies SI/HI/AH/VH and does not appear to be responding to any internal or external  stimuli.  Disposition pending Chief Complaint  Patient presents with  . Psychiatric Evaluation   Visit Diagnosis:     CCA Screening, Triage and Referral (STR)  Patient Reported Information How did you hear about [place]? Family/Friend  Referral name: No data recorded Referral phone number: No data recorded  Whom do you see for routine medical problems? Other (Comment)  Practice/Facility Name: No data recorded Practice/Facility Phone Number: No data recorded Name of Contact: No data recorded Contact Number: No data recorded Contact Fax Number: No data recorded Prescriber Name: No data recorded Prescriber Address (if known): No data recorded  What Is the Reason for Your Visit/Call Today? No data recorded How Long Has This Been Causing You Problems? 1 wk - 1 month  What Do You Feel Would Help You the Most Today? No data recorded  Have You Recently Been in Any Inpatient Treatment (Hospital/Detox/Crisis Center/28-Day Program)? No  Name/Location of Program/Hospital:No data recorded How Long Were You There? No data recorded When Were You Discharged? No data recorded  Have You Ever Received Services From [facility] Before? No  Who Do You See at [facility]? No data recorded  Have You Recently Had Any Thoughts About Hurting Yourself? No  Are You Planning to Commit Suicide/Harm Yourself At This time? No   Have you Recently Had Thoughts About Hurting Someone Karolee Ohs? No  Explanation: No data recorded  Have You Used Any Alcohol or Drugs in the Past 24 Hours? No  How Long Ago Did You Use Drugs or Alcohol? No data  recorded What Did You Use and How Much? No data recorded  Do You Currently Have a Therapist/Psychiatrist? No  Name of Therapist/Psychiatrist: No data recorded  Have You Been Recently Discharged From Any Office Practice or Programs? No  Explanation of Discharge From Practice/Program: No data recorded    CCA Screening Triage Referral Assessment Type of Contact:  Face-to-Face  Is this Initial or Reassessment? No data recorded Date Telepsych consult ordered in [facility]:  No data recorded Time Telepsych consult ordered in [facility]:  No data recorded  Patient Reported Information Reviewed? Yes  Patient Left Without Being Seen? No data recorded Reason for Not Completing Assessment: No data recorded  Collateral Involvement: No data recorded  Does Patient Have a Court Appointed Legal Guardian? No data recorded Name and Contact of Legal Guardian: No data recorded If Minor and Not Living with Parent(s), Who has Custody? No data recorded Is CPS involved or ever been involved? Never  Is APS involved or ever been involved? Never   Patient Determined To Be At Risk for Harm To Self or Others Based on Review of Patient Reported Information or Presenting Complaint? No  Method: No data recorded Availability of Means: No data recorded Intent: No data recorded Notification Required: No data recorded Additional Information for Danger to Others Potential: No data recorded Additional Comments for Danger to Others Potential: No data recorded Are There Guns or Other Weapons in Your Home? No data recorded Types of Guns/Weapons: No data recorded Are These Weapons Safely Secured?                            No data recorded Who Could Verify You Are Able To Have These Secured: No data recorded Do You Have any Outstanding Charges, Pending Court Dates, Parole/Probation? No data recorded Contacted To Inform of Risk of Harm To Self or Others: No data recorded  Location of Assessment: [facility] ED   Does Patient Present under Involuntary Commitment? Yes  IVC Papers Initial File Date: 05/14/2020   [place] of Residence: [place]   Patient Currently Receiving the Following Services: No data recorded  Determination of Need: Emergent (2 hours)   Options For Referral: No data recorded    CCA Biopsychosocial Intake/Chief Complaint:  Patient presents under IVC due to bizzare  behavior  Current Symptoms/Problems: Patient presents under IVC due to bizzare behavior   Patient Reported Schizophrenia/Schizoaffective Diagnosis in Past: No   Strengths: Patient is able to communicate  Preferences: Unknown  Abilities: Patient is able to communicate   Type of Services Patient Feels are Needed: Unknown   Initial Clinical Notes/Concerns: None   Mental Health Symptoms Depression:  None   Duration of Depressive symptoms: No data recorded  Mania:  None   Anxiety:   None   Psychosis:  None   Duration of Psychotic symptoms: No data recorded  Trauma:  None   Obsessions:  None   Compulsions:  None   Inattention:  None   Hyperactivity/Impulsivity:  N/A   Oppositional/Defiant Behaviors:  None   Emotional Irregularity:  None   Other Mood/Personality Symptoms:  No data recorded   Mental Status Exam Appearance and self-care  Stature:  Average   Weight:  Average weight   Clothing:  Casual   Grooming:  Normal   Cosmetic use:  None   Posture/gait:  Normal   Motor activity:  Not Remarkable   Sensorium  Attention:  Normal   Concentration:  Normal   Orientation:  X5   Recall/memory:  Normal   Affect and Mood  Affect:  Appropriate   Mood:  Other (Comment)   Relating  Eye contact:  Normal   Facial expression:  Responsive   Attitude toward examiner:  Cooperative   Thought and Language  Speech flow: Clear and Coherent   Thought content:  Appropriate to Mood and Circumstances   Preoccupation:  None   Hallucinations:  None   Organization:  No data recorded  [employer] of Knowledge:  Fair   Intelligence:  Average   Abstraction:  Functional   Judgement:  Fair   Reality Testing:  Adequate   Insight:  Fair   Decision Making:  Normal   Social Functioning  Social Maturity:  Responsible   Social Judgement:  Normal   Stress  Stressors:  Other (Comment)   Coping Ability:  Normal   Skill Deficits:   None   Supports:  Family     Religion: Religion/Spirituality Are You A Religious Person?: No  Leisure/Recreation: Leisure / Recreation Do You Have Hobbies?: No  Exercise/Diet: Exercise/Diet Do You Exercise?: No Have You Gained or Lost A Significant Amount of Weight in the Past Six Months?: No Do You Follow a Special Diet?: No Do You Have Any Trouble Sleeping?: No   CCA Employment/Education Employment/Work Situation: Employment / Work Situation Employment situation: On disability Why is patient on disability: "Blood Clots" How long has patient been on disability: Unknown Has patient ever been in the [employer]?: No  Education: Education Is Patient Currently Attending School?: No Did You Have An Individualized Education Program (IIEP): No Did You Have Any Difficulty At [employer]?: No Patient's Education Has Been Impacted by Current Illness: No   CCA Family/Childhood History Family and Relationship History: Family history Marital status: Single Are you sexually active?:  (Unknown) What is your sexual orientation?: Unknown Has your sexual activity been affected by drugs, alcohol, medication, or emotional stress?: Unknown Does patient have children?: Yes How many children?: 1  Childhood History:  Childhood History Additional childhood history information: None reported Description of patient's relationship with caregiver when they were a child: None reported Patient's description of current relationship with people who raised him/her: None reported How were you disciplined when you got in trouble as a child/adolescent?: None reported Does patient have siblings?: Yes Number of Siblings: 1 Did patient suffer any verbal/emotional/physical/sexual abuse as a child?: No Did patient suffer from severe childhood neglect?: No Has patient ever been sexually abused/assaulted/raped as an adolescent or adult?: No Was the patient ever a victim of a crime or a disaster?:  No Witnessed domestic violence?: No Has patient been affected by domestic violence as an adult?: No  Child/Adolescent Assessment:     CCA Substance Use Alcohol/Drug Use: Alcohol / Drug Use Pain Medications: See MAR Prescriptions: See MAR Over the Counter: See MAR History of alcohol / drug use?: No history of alcohol / drug abuse                         ASAM's:  Six Dimensions of Multidimensional Assessment  Dimension 1:  Acute Intoxication and/or Withdrawal Potential:      Dimension 2:  Biomedical Conditions and Complications:      Dimension 3:  Emotional, Behavioral, or Cognitive Conditions and Complications:     Dimension 4:  Readiness to Change:     Dimension 5:  Relapse, Continued use, or Continued Problem Potential:     Dimension 6:  Recovery/Living Environment:     ASAM Severity Score:    ASAM Recommended Level of Treatment:     Substance use Disorder (SUD)    Recommendations for Services/Supports/Treatments:  Disposition pending  DSM5 Diagnoses: Patient Active Problem List   Diagnosis Date Noted  . Lymphedema 11/13/2019  . Patent foramen ovale with right to left shunt 11/06/2019  . 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 Swelling of limb 11/06/2019  . Acute respiratory failure with hypoxia (HCC) 04/18/2015  . Atherosclerosis of native artery of extremity with intermittent claudication (HCC) 05/19/2012  . Septal defect 05/19/2012  . Wound of right groin 05/05/2012  . H/O fasciotomy 02/21/2012  . Other pulmonary embolism without acute cor pulmonale (HCC) 02/21/2012  . Iliac artery occlusion, bilateral (HCC) 02/21/2012    Patient Centered Plan: Patient is on the following Treatment Plan(s):     Referrals to Alternative Service(s): Referred to Alternative Service(s):   Place:   Date:   Time:    Referred to Alternative Service(s):   Place:   Date:   Time:    Referred to Alternative Service(s):   Place:   Date:   Time:    Referred to Alternative  Service(s):   Place:   Date:   Time:     Mariane Burpee A Angelica Wix, LCAS-A

## 2020-05-14 NOTE — ED Notes (Signed)
Hourly rounding performed, patient currently awake in room speaking to TTS. Patient has no complaints at this time. Q15 minute rounds and monitoring via Psychologist, counselling to continue.

## 2020-05-14 NOTE — ED Triage Notes (Signed)
Pt comes EMS from home with AMS. Pt's mom denies psych hx but pt talking in tangential and word salad sentences. Talking about her "timeline" and how she can't mess it up by talking. Guarded but allows [place] to get her dressed out. Per ems, pt was much more upset and it took them about an hour to get her to come with them.

## 2020-05-14 NOTE — ED Notes (Signed)
Hourly rounding performed, patient currently awake in room. Patient has no complaints at this time. Q15 minute rounds and monitoring via Rover and Officer to continue. 

## 2020-05-14 NOTE — ED Provider Notes (Signed)
[facility] Emergency Department Provider Note  Time seen: 6:44 PM  I have reviewed the triage vital signs and the nursing notes.   HISTORY  Chief Complaint Psychiatric Evaluation   HPI Anna Adkins is a 55 y.o. female with a past medical history anxiety, TIA, presents to the emergency department for psychiatric evaluation.   According to EMS per patient's mom patient has been confused and has not been sleeping over the past 4 days.  Here the patient is awake and alert she appears confused is talking about her "timeline" how she cannot talk to people, stating she does not want to change anyone else's "timeline" due to time travel.  Patient is prying at times.  Per EMS no known psychiatric history.  Past Medical History:  Diagnosis Date  . Anxiety   . Anxiety   . Arthritis   . Depression   . Depression   . DVT (deep venous thrombosis) (HCC)   . DVT of leg (deep venous thrombosis) (HCC) 2014  . Pulmonary emboli (HCC)   . TIA (transient ischemic attack) 2014    Patient Active Problem List   Diagnosis Date Noted  . Lymphedema 11/13/2019  . Patent foramen ovale with right to left shunt 11/06/2019  . 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 Swelling of limb 11/06/2019  . Acute respiratory failure with hypoxia (HCC) 04/18/2015  . Atherosclerosis of native artery of extremity with intermittent claudication (HCC) 05/19/2012  . Septal defect 05/19/2012  . Wound of right groin 05/05/2012  . H/O fasciotomy 02/21/2012  . Other pulmonary embolism without acute cor pulmonale (HCC) 02/21/2012  . 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 Blood clot removal    . ECTOPIC PREGNANCY SURGERY    . HERNIA REPAIR    . HERNIA REPAIR  2001  . SKIN GRAFT    . TUBAL LIGATION      Prior to Admission medications   Medication Sig Start Date End Date Taking? Authorizing Provider  acetaminophen (TYLENOL) 500  MG tablet Take 500 mg by mouth every 6 (six) hours as needed.    [provider]  Biotin 1000 MCG tablet Take 1,000 mcg by mouth daily. Patient not taking: Reported on 11/06/2019    [provider]  escitalopram (LEXAPRO) 20 MG tablet Take 20 mg by mouth daily.  11/04/17   [provider]  fluticasone (FLONASE) 50 MCG/ACT nasal spray Place 1 spray into both nostrils daily. 06/08/19   [provider]  loratadine (CLARITIN) 10 MG tablet Take 10 mg by mouth daily.    [provider]  Multiple Vitamin (MULTIVITAMIN WITH MINERALS) TABS tablet Take 1 tablet by mouth daily.    [provider]  VENTOLIN HFA 108 [phone] Base) MCG/ACT inhaler  08/31/17   [provider]  warfarin (COUMADIN) 4 MG tablet Take 4 mg by mouth every evening.    [provider]    Allergies  Allergen Reactions  . Sulfa Antibiotics Anaphylaxis    Family History  Problem Relation Age of Onset  . Healthy Mother   . Heart disease Father   . Diabetes Father   . Asthma Father   . Breast cancer Neg Hx     Social History Social History   Tobacco Use  . Smoking status: Current Every Day Smoker  . Smokeless tobacco: Never Used  Vaping Use  . Vaping Use: Never used  Substance Use Topics  . Alcohol  use: Yes    Alcohol/week: 1.0 standard drink    Types: 1 Glasses of wine per week    Comment: occasional  . Drug use: No    Review of Systems Constitutional: Negative for fever. Cardiovascular: Negative for chest pain. Respiratory: Negative for shortness of breath. Gastrointestinal: Negative for abdominal pain Musculoskeletal: Negative for musculoskeletal complaints Neurological: Negative for headache All other ROS negative, although possibly limited due to patient's altered mental state/psychosis.  ____________________________________________   PHYSICAL EXAM:  VITAL SIGNS: ED Triage Vitals  Enc Vitals Group     BP 05/14/20 1742 129/68      Pulse Rate 05/14/20 1742 93     Resp 05/14/20 1742 18     Temp 05/14/20 1742 98.7 F (37.1 C)     Temp Source 05/14/20 1742 Oral     SpO2 05/14/20 1742 93 %     Weight 05/14/20 1732 275 lb 9.2 oz (125 kg)     Height 05/14/20 1732 5\' 8"  (1.727 m)     Head Circumference --      Peak Flow --      Pain Score 05/14/20 1732 0     Pain Loc --      Pain Edu? --      Excl. in GC? --     Constitutional: Patient is awake and alert, no acute distress pupils sitting in bed answering questions.  Patient is making comments about her time travel and about changing "timelines." Eyes: Normal exam ENT      Head: Normocephalic and atraumatic.      Mouth/Throat: Mucous membranes are moist. Cardiovascular: Normal rate, regular rhythm.  Respiratory: Normal respiratory effort without tachypnea nor retractions. Breath sounds are clear  Gastrointestinal: Soft and nontender. No distention.   Musculoskeletal: Nontender with normal range of motion in all extremities.  Neurologic:  Normal speech and language. No gross focal neurologic deficits Skin:  Skin is warm, dry and intact.  Psychiatric: Patient is altered, consistent with acute psychosis  ____________________________________________     RADIOLOGY  CT head negative  ____________________________________________   INITIAL IMPRESSION / ASSESSMENT AND PLAN / ED COURSE  Pertinent labs & imaging results that were available during my care of the patient were reviewed by me and considered in my medical decision making (see chart for details).   Patient presents emergency department for psychosis, he is talking about time travel and messing up "timelines."  Per report patient has not slept in approximately 4 days.  No known psychiatric history.  We will place the patient under an IVC into the patient has been psychiatrically evaluated.  We will check labs and obtain a CT scan of the head given no known psychiatric history and apparent acute  psychosis.  CT scan head is negative.  Lab work is largely nonrevealing.  Drug screen negative.  COVID test is pending.  We have ordered a telemetry psychiatry consultation.  Patient is under an IVC.  ALANNI VADER was evaluated in Emergency Department on 05/14/2020 for the symptoms described in the history of present illness. She was evaluated in the context of the global COVID-19 pandemic, which necessitated consideration that the patient might be at risk for infection with the SARS-CoV-2 virus that causes COVID-19. Institutional protocols and algorithms that pertain to the evaluation of patients at risk for COVID-19 are in a state of rapid change based on information released by regulatory bodies including the CDC and federal and state organizations. These policies and algorithms were followed during the  patient's care in the ED.  The patient has been placed in psychiatric observation due to the need to provide a safe environment for the patient while obtaining psychiatric consultation and evaluation, as well as ongoing medical and medication management to treat the patient's condition.  The patient has been placed under full IVC at this time.   ____________________________________________   FINAL CLINICAL IMPRESSION(S) / ED DIAGNOSES  Psychosis   Minna Antis, MD 05/14/20 1949

## 2020-05-14 NOTE — ED Notes (Signed)
CALLED SOC (RACHEL) @ 19:52

## 2020-05-14 NOTE — ED Notes (Signed)
Hourly rounding performed, patient currently asleep in room. Patient has no complaints at this time. Q15 minute rounds and monitoring via Rover and Officer to continue. 

## 2020-05-14 NOTE — ED Notes (Signed)
Gave Pt [place] snack tray sprite and cranberry juice. AS

## 2020-05-14 NOTE — ED Notes (Signed)
Pt belongings include one pink shirt, one pair pajama pants, two white crocs, one cell phone turned off, one purse with wallet. 1/1 belongings bag.

## 2020-05-14 NOTE — ED Notes (Signed)
Report received from [place], English as a second language teacher. Patient alert and oriented, warm and dry, and in no acute distress. Patient denies SI, HI, AVH and pain. Patient made aware of Q15 minute rounds and Psychologist, counselling presence for their safety. Patient instructed to come to this nurse with needs or concerns.

## 2020-05-15 DIAGNOSIS — F23 Brief psychotic disorder: Secondary | ICD-10-CM

## 2020-05-15 MED ORDER — DROPERIDOL 2.5 MG/ML IJ SOLN
5.0000 mg | Freq: Once | INTRAMUSCULAR | Status: AC
  Administered 2020-05-15: 5 mg via INTRAMUSCULAR

## 2020-05-15 MED ORDER — OLANZAPINE 5 MG PO TBDP
10.0000 mg | ORAL_TABLET | Freq: Once | ORAL | Status: DC
  Filled 2020-05-15: qty 2

## 2020-05-15 NOTE — ED Notes (Signed)
Hourly rounding performed, patient currently asleep in room. Patient has no complaints at this time. Q15 minute rounds and monitoring via Rover and Officer to continue. 

## 2020-05-15 NOTE — ED Notes (Signed)
INVOLUNTARY/awaiting placement °

## 2020-05-15 NOTE — Consult Note (Signed)
[facility] Face-to-Face Psychiatry Consult   Reason for Consult: Consult for 55 year old woman with unclear past psychiatric history who presents with altered mental status and behavior Referring Physician: Fuller Plan Patient Identification: Anna Adkins MRN:  [id number] Principal Diagnosis: Brief reactive psychosis (HCC) Diagnosis:  Principal Problem:   Brief reactive psychosis (HCC)   Total Time spent with patient: 1 hour  Subjective:   Anna Adkins is a 55 y.o. female patient admitted with "I do not know how to say it".  HPI: Patient seen chart reviewed.  55 year old woman brought into the emergency room last night.  Circumstances around the presentation are unclear to me.  Patient was described as being different from her usual self.  She was confused and making statements that made no sense.  Later became agitated in the emergency room and had to be given forced medicine with a brief hold.  I came to see her this morning and she was still sleepy and could barely wake up.  She was able to tell me she lived and may have been in a trailer by herself that she does not work that she is on disability.  She could not tell me whether she is currently on any medicine.  Could not describe any acute stress.  Was not able to answer questions meaningfully about suicidal thought or hallucinations.  Denied drug use.  Labs do not show any sign of drug or alcohol abuse.  Past Psychiatric History: Patient has been prescribed Lexapro presumably for depression.  No known history of inpatient hospitalization or suicide attempts or mania  Risk to Self:   Risk to Others:   Prior Inpatient Therapy:   Prior Outpatient Therapy:    Past Medical History:  Past Medical History:  Diagnosis Date  . Anxiety   . Anxiety   . Arthritis   . Depression   . Depression   . DVT (deep venous thrombosis) (HCC)   . DVT of leg (deep venous thrombosis) (HCC) 2014  . Pulmonary emboli (HCC)   . TIA (transient ischemic attack) 2014     Past Surgical History:  Procedure Laterality Date  . Blood clot removal    . ECTOPIC PREGNANCY SURGERY    . HERNIA REPAIR    . HERNIA REPAIR  2001  . SKIN GRAFT    . TUBAL LIGATION     Family History:  Family History  Problem Relation Age of Onset  . Healthy Mother   . Heart disease Father   . Diabetes Father   . Asthma Father   . Breast cancer Neg Hx    Family Psychiatric  History: Unknown Social History:  Social History   Substance and Sexual Activity  Alcohol Use Yes  . Alcohol/week: 1.0 standard drink  . Types: 1 Glasses of wine per week   Comment: occasional     Social History   Substance and Sexual Activity  Drug Use No    Social History   Socioeconomic History  . Marital status: Single    Spouse name: Not on file  . Number of children: Not on file  . Years of education: Not on file  . Highest education level: Not on file  Occupational History  . Not on file  Tobacco Use  . Smoking status: Current Every Day Smoker  . Smokeless tobacco: Never Used  Vaping Use  . Vaping Use: Never used  Substance and Sexual Activity  . Alcohol use: Yes    Alcohol/week: 1.0 standard drink  Types: 1 Glasses of wine per week    Comment: occasional  . Drug use: No  . Sexual activity: Not on file  Other Topics Concern  . Not on file  Social History Narrative  . Not on file   Social Determinants of Health   Financial Resource Strain: Not on file  Food Insecurity: Not on file  Transportation Needs: Not on file  Physical Activity: Not on file  Stress: Not on file  Social Connections: Not on file   Additional Social History:    Allergies:   Allergies  Allergen Reactions  . Sulfa Antibiotics Anaphylaxis    Labs:  Results for orders placed or performed during the hospital encounter of 05/14/20 (from the past 48 hour(s))  CBC     Status: None   Collection Time: 05/14/20  5:36 PM  Result Value Ref Range   WBC 9.7 4.0 - 10.5 K/uL   RBC 4.69 3.87 - 5.11  MIL/uL   Hemoglobin 13.8 12.0 - 15.0 g/dL   HCT 16.142.8 09.636.0 - 04.546.0 %   MCV 91.3 80.0 - 100.0 fL   MCH 29.4 26.0 - 34.0 pg   MCHC 32.2 30.0 - 36.0 g/dL   RDW 40.914.6 81.111.5 - 91.415.5 %   Platelets 218 150 - 400 K/uL   nRBC 0.0 0.0 - 0.2 %    Comment: Performed at [facility], [address] [place], [place] [address]  Comprehensive metabolic panel     Status: Abnormal   Collection Time: 05/14/20  5:36 PM  Result Value Ref Range   Sodium 138 135 - 145 mmol/L   Potassium 3.3 (L) 3.5 - 5.1 mmol/L   Chloride 103 98 - 111 mmol/L   CO2 23 22 - 32 mmol/L   Glucose, Bld 121 (H) 70 - 99 mg/dL    Comment: Glucose reference range applies only to samples taken after fasting for at least 8 hours.   BUN 10 6 - 20 mg/dL   Creatinine, Ser 6.210.64 0.44 - 1.00 mg/dL   Calcium 9.5 8.9 - 30.810.3 mg/dL   Total Protein 7.8 6.5 - 8.1 g/dL   Albumin 4.2 3.5 - 5.0 g/dL   AST 23 15 - 41 U/L   ALT 18 0 - 44 U/L   Alkaline Phosphatase 49 38 - 126 U/L   Total Bilirubin 1.0 0.3 - 1.2 mg/dL   GFR, Estimated >65>60 >78>60 mL/min    Comment: (NOTE) Calculated using the CKD-EPI Creatinine Equation (2021)    Anion gap 12 5 - 15    Comment: Performed at [facility], [address] [place], [place] [address]  Ethanol     Status: None   Collection Time: 05/14/20  5:36 PM  Result Value Ref Range   Alcohol, Ethyl (B) <10 <10 mg/dL    Comment: (NOTE) Lowest detectable limit for serum alcohol is 10 mg/dL.  For medical purposes only. Performed at [facility], [address] [place], [place] [address]   Salicylate level     Status: Abnormal   Collection Time: 05/14/20  5:36 PM  Result Value Ref Range   Salicylate Lvl <7.0 (L) 7.0 - 30.0 mg/dL    Comment: Performed at [facility], [address] [place], [place] [address]  Acetaminophen level     Status: Abnormal   Collection Time: 05/14/20  5:36 PM  Result Value Ref Range   Acetaminophen (Tylenol), Serum <10 (L) 10 - 30 ug/mL     Comment: (NOTE) Therapeutic concentrations  vary significantly. A range of 10-30 ug/mL  may be an effective concentration for many patients. However, some  are best treated at concentrations outside of this range. Acetaminophen concentrations >150 ug/mL at 4 hours after ingestion  and >50 ug/mL at 12 hours after ingestion are often associated with  toxic reactions.  Performed at [facility], [address], [place], [place] [address]   Urine Drug Screen, Qualitative [facility] only)     Status: None   Collection Time: 05/14/20  6:51 PM  Result Value Ref Range   Tricyclic, Ur Screen NONE DETECTED NONE DETECTED   Amphetamines, Ur Screen NONE DETECTED NONE DETECTED   MDMA (Ecstasy)Ur Screen NONE DETECTED NONE DETECTED   Cocaine Metabolite,Ur [place] NONE DETECTED NONE DETECTED   Opiate, Ur Screen NONE DETECTED NONE DETECTED   Phencyclidine (PCP) Ur S NONE DETECTED NONE DETECTED   Cannabinoid 50 Ng, Ur [place] NONE DETECTED NONE DETECTED   Barbiturates, Ur Screen NONE DETECTED NONE DETECTED   Benzodiazepine, Ur Scrn NONE DETECTED NONE DETECTED   Methadone Scn, Ur NONE DETECTED NONE DETECTED    Comment: (NOTE) Tricyclics + metabolites, urine    Cutoff 1000 ng/mL Amphetamines + metabolites, urine  Cutoff 1000 ng/mL MDMA (Ecstasy), urine              Cutoff 500 ng/mL Cocaine Metabolite, urine          Cutoff 300 ng/mL Opiate + metabolites, urine        Cutoff 300 ng/mL Phencyclidine (PCP), urine         Cutoff 25 ng/mL Cannabinoid, urine                 Cutoff 50 ng/mL Barbiturates + metabolites, urine  Cutoff 200 ng/mL Benzodiazepine, urine              Cutoff 200 ng/mL Methadone, urine                   Cutoff 300 ng/mL  The urine drug screen provides only a preliminary, unconfirmed analytical test result and should not be used for non-medical purposes. Clinical consideration and professional judgment should be applied to any positive drug screen result due to possible interfering  substances. A more specific alternate chemical method must be used in order to obtain a confirmed analytical result. Gas chromatography / mass spectrometry (GC/MS) is the preferred confirm atory method. Performed at [facility], [address] [place], [place] [address]   Urinalysis, Complete w Microscopic Urine, Clean Catch     Status: Abnormal   Collection Time: 05/14/20  6:55 PM  Result Value Ref Range   Color, Urine YELLOW (A) YELLOW   APPearance HAZY (A) CLEAR   Specific Gravity, Urine 1.009 1.005 - 1.030   pH 6.0 5.0 - 8.0   Glucose, UA NEGATIVE NEGATIVE mg/dL   Hgb urine dipstick NEGATIVE NEGATIVE   Bilirubin Urine NEGATIVE NEGATIVE   Ketones, ur 20 (A) NEGATIVE mg/dL   Protein, ur 30 (A) NEGATIVE mg/dL   Nitrite NEGATIVE NEGATIVE   Leukocytes,Ua NEGATIVE NEGATIVE   RBC / HPF 0-5 0 - 5 RBC/hpf   WBC, UA 0-5 0 - 5 WBC/hpf   Bacteria, UA RARE (A) NONE SEEN   Squamous Epithelial / LPF 11-20 0 - 5   Mucus PRESENT     Comment: Performed at [facility], [address] [place], [place] [address]  Resp Panel by RT-PCR (Flu A&B, Covid) Nasopharyngeal Swab     Status: None  Collection Time: 05/14/20  7:34 PM   Specimen: Nasopharyngeal Swab; Nasopharyngeal(NP) swabs in vial transport medium  Result Value Ref Range   SARS Coronavirus 2 by RT PCR NEGATIVE NEGATIVE    Comment: (NOTE) SARS-CoV-2 target nucleic acids are NOT DETECTED.  The SARS-CoV-2 RNA is generally detectable in upper respiratory specimens during the acute phase of infection. The lowest concentration of SARS-CoV-2 viral copies this assay can detect is 138 copies/mL. A negative result does not preclude SARS-Cov-2 infection and should not be used as the sole basis for treatment or other patient management decisions. A negative result may occur with  improper specimen collection/handling, submission of specimen other than nasopharyngeal swab, presence of viral mutation(s) within the areas  targeted by this assay, and inadequate number of viral copies(<138 copies/mL). A negative result must be combined with clinical observations, patient history, and epidemiological information. The expected result is Negative.  Fact Sheet for Patients:  BloggerCourse.com  Fact Sheet for Healthcare Providers:  SeriousBroker.it  This test is no t yet approved or cleared by the [place] FDA and  has been authorized for detection and/or diagnosis of SARS-CoV-2 by FDA under an Emergency Use Authorization (EUA). This EUA will remain  in effect (meaning this test can be used) for the duration of the COVID-19 declaration under Section 564(b)(1) of the Act, 21 U.S.C.section 360bbb-3(b)(1), unless the authorization is terminated  or revoked sooner.       Influenza A by PCR NEGATIVE NEGATIVE   Influenza B by PCR NEGATIVE NEGATIVE    Comment: (NOTE) The Xpert Xpress SARS-CoV-2/FLU/RSV plus assay is intended as an aid in the diagnosis of influenza from Nasopharyngeal swab specimens and should not be used as a sole basis for treatment. Nasal washings and aspirates are unacceptable for Xpert Xpress SARS-CoV-2/FLU/RSV testing.  Fact Sheet for Patients: BloggerCourse.com  Fact Sheet for Healthcare Providers: SeriousBroker.it  This test is not yet approved or cleared by the [place] FDA and has been authorized for detection and/or diagnosis of SARS-CoV-2 by FDA under an Emergency Use Authorization (EUA). This EUA will remain in effect (meaning this test can be used) for the duration of the COVID-19 declaration under Section 564(b)(1) of the Act, 21 U.S.C. section 360bbb-3(b)(1), unless the authorization is terminated or revoked.  Performed at [facility], [address] [place], [place] [address]     No current facility-administered medications for this encounter.    Current Outpatient Medications  Medication Sig Dispense Refill  . acetaminophen (TYLENOL) 500 MG tablet Take 500 mg by mouth every 6 (six) hours as needed.    . Biotin 1000 MCG tablet Take 1,000 mcg by mouth daily. (Patient not taking: Reported on 11/06/2019)    . escitalopram (LEXAPRO) 20 MG tablet Take 20 mg by mouth daily.     . fluticasone (FLONASE) 50 MCG/ACT nasal spray Place 1 spray into both nostrils daily.    [name] loratadine (CLARITIN) 10 MG tablet Take 10 mg by mouth daily.    . Multiple Vitamin (MULTIVITAMIN WITH MINERALS) TABS tablet Take 1 tablet by mouth daily.    . VENTOLIN HFA 108 (90 Base) MCG/ACT inhaler     . warfarin (COUMADIN) 4 MG tablet Take 4 mg by mouth every evening.      Musculoskeletal: Strength & Muscle Tone: within normal limits Gait & Station: So far I have not been able to see her get up and walk Patient leans: N/A            Psychiatric  Specialty Exam:  Presentation  General Appearance: No data recorded Eye Contact:No data recorded Speech:No data recorded Speech Volume:No data recorded Handedness:No data recorded  Mood and Affect  Mood:No data recorded Affect:No data recorded  Thought Process  Thought Processes:No data recorded Descriptions of Associations:No data recorded Orientation:No data recorded Thought Content:No data recorded History of Schizophrenia/Schizoaffective disorder:No  Duration of Psychotic Symptoms:No data recorded Hallucinations:No data recorded Ideas of Reference:No data recorded Suicidal Thoughts:No data recorded Homicidal Thoughts:No data recorded  Sensorium  Memory:No data recorded Judgment:No data recorded Insight:No data recorded  Executive Functions  Concentration:No data recorded Attention Span:No data recorded Recall:No data recorded Fund of Knowledge:No data recorded Language:No data recorded  Psychomotor Activity  Psychomotor Activity:No data recorded  Assets  Assets:No data  recorded  Sleep  Sleep:No data recorded  Physical Exam: Physical Exam Vitals and nursing note reviewed.  Constitutional:      Appearance: Normal appearance. She is obese.  HENT:     Head: Normocephalic and atraumatic.     Mouth/Throat:     Pharynx: Oropharynx is clear.  Eyes:     Pupils: Pupils are equal, round, and reactive to light.  Cardiovascular:     Rate and Rhythm: Normal rate and regular rhythm.  Pulmonary:     Effort: Pulmonary effort is normal.     Breath sounds: Normal breath sounds.  Abdominal:     General: Abdomen is flat.     Palpations: Abdomen is soft.  Musculoskeletal:        General: Normal range of motion.  Skin:    General: Skin is warm and dry.  Neurological:     General: No focal deficit present.     Mental Status: She is alert. Mental status is at baseline.  Psychiatric:        Attention and Perception: She is inattentive.        Mood and Affect: Mood normal. Affect is blunt and inappropriate.        Speech: She is noncommunicative.        Behavior: Behavior is withdrawn.        Cognition and Memory: Cognition is impaired. Memory is impaired.        Judgment: Judgment is inappropriate.    Review of Systems  Unable to perform ROS: Psychiatric disorder   Blood pressure 124/76, pulse 90, temperature 98.3 F (36.8 C), temperature source Oral, resp. rate 17, height 5\' 8"  (1.727 m), weight 125 kg, SpO2 95 %. Body mass index is 41.9 kg/m.  Treatment Plan Summary: Plan This is a 55 year old woman who comes in with a change in her mental status with confusion and disordered thinking.  Unable so far to give any history.  I came back to see her this afternoon hoping she would be awake but once again she was not able to cooperate with the interview.  During our conversation she made bizarre repetitive gestures with her hands and whistled and muttered to herself but could not answer questions directly.  Work-up noncontributory.  CT unremarkable.  Labs  unremarkable.  Tentative diagnosis brief reactive psychosis.  Appropriate treatment admission to psychiatric unit.  No beds available at our hospital we will refer her out to other facilities.  Patient informed of the plan.  Disposition: Recommend psychiatric Inpatient admission when medically cleared.  57, MD 05/15/2020 3:19 PM

## 2020-05-15 NOTE — ED Provider Notes (Signed)
Emergency Medicine Observation Re-evaluation Note  Anna Adkins is a 55 y.o. female, seen on rounds today.  Pt initially presented to the ED for complaints of Psychiatric Evaluation Currently, the patient is resting comfortably.  Physical Exam  BP 117/72   Pulse 81   Temp 98.5 F (36.9 C) (Oral)   Resp 16   Ht 5\' 8"  (1.727 m)   Wt 125 kg   SpO2 94%   BMI 41.90 kg/m  Physical Exam Constitutional:      Appearance: She is not ill-appearing or toxic-appearing.  HENT:     Head: Atraumatic.  Cardiovascular:     Comments: Well perfused Pulmonary:     Effort: Pulmonary effort is normal.  Abdominal:     General: There is no distension.  Musculoskeletal:        General: No deformity.  Skin:    Findings: No rash.  Neurological:     General: No focal deficit present.     Cranial Nerves: No cranial nerve deficit.      ED Course / MDM  EKG:   I have reviewed the labs performed to date as well as medications administered while in observation.  Recent changes in the last 24 hours include need for intramuscular calming agents due to manic and erratic behavior.  Plan  Current plan is for psychiatric assessment for possible inpatient stay. Patient is under full IVC at this time.   , MD 05/15/20 [phone]

## 2020-05-15 NOTE — ED Notes (Signed)
Patient in room making strange sounds at this time.

## 2020-05-15 NOTE — ED Notes (Signed)
Pt heard suddenly starting to yell in room. This nurse enters room and pt is laying on left side, both hands on side rail of bed. Pt is screaming "help" "oh my God" and thrashing and shaking side rail of bed with bizarre behaviors and hand motions. This nurse attempts to speak to pt, pt does not communicate accurately and is repetitive with her words. Pt yells "don't talk to me' "I don't know you" "help me." This nurse attempts to verbally deescalate pt and reorient with no success. Dr. Katrinka Blazing is notified at this time. Order to follow.

## 2020-05-15 NOTE — ED Notes (Addendum)
Pt refuses to take PO medications, pt is uncooperative and continues to repeat the same statements as before. Pt has now started to throw her arms around the bed and flail her hands, pillow is being hit that is laying on pt. Pt will smack her own face and state, "stop hitting me" Dr. Katrinka Blazing notified of situation again.
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
## 2020-05-15 NOTE — ED Notes (Signed)
Pt is more calm now, drowsy, still is apprehensive for staff to be in room but vitals are able to be obtained. Warm blanket placed over pt

## 2020-05-15 NOTE — ED Notes (Signed)
Pt belongings taken home with sister, Babette Relic. Per pt's consent.

## 2020-05-15 NOTE — ED Notes (Signed)
Patient took her linens off bed and threw on the floor stating cats had been lying on it ,brought patient new linens and she threw them on floor and asked me to take it out at this time, patient has no linens on her bed.

## 2020-05-15 NOTE — ED Notes (Signed)
Pt required to have manual holds at 0202, these lasted for time for medication administration. Pt did not fight after contact was initiated and remained still and calm after Security assisted with manual holds. Pt remained on her left side, IM to R arm. Pt only required holding of right hand, which had to be caught from her flailing her arm around in the air and hitting he pillow. Once that was done, pt laid calm and still for injection. Pt was released within 15 seconds. No injuries noted and full ROM noted as pt started to move all extremities again. Unable to collect vitals as pt continues to move arms around and same actions. Will continue to monitor.

## 2020-05-16 ENCOUNTER — Encounter (HOSPITAL_COMMUNITY): Payer: Self-pay | Admitting: Psychiatry

## 2020-05-16 ENCOUNTER — Other Ambulatory Visit: Payer: Self-pay

## 2020-05-16 ENCOUNTER — Inpatient Hospital Stay (HOSPITAL_COMMUNITY)
Admission: AD | Admit: 2020-05-16 | Discharge: 2020-05-19 | DRG: 885 | Disposition: A | Discharge status: Home / Self Care | Payer: Medicare Other | Source: Other Acute Inpatient Hospital | Attending: Psychiatry | Admitting: Psychiatry

## 2020-05-16 ENCOUNTER — Other Ambulatory Visit: Payer: Self-pay | Admitting: Psychiatry

## 2020-05-16 DIAGNOSIS — G4700 Insomnia, unspecified: Secondary | ICD-10-CM | POA: Diagnosis present

## 2020-05-16 DIAGNOSIS — F333 Major depressive disorder, recurrent, severe with psychotic symptoms: Principal | ICD-10-CM | POA: Diagnosis present

## 2020-05-16 DIAGNOSIS — Z79899 Other long term (current) drug therapy: Secondary | ICD-10-CM | POA: Diagnosis not present

## 2020-05-16 DIAGNOSIS — F419 Anxiety disorder, unspecified: Secondary | ICD-10-CM | POA: Diagnosis present

## 2020-05-16 DIAGNOSIS — R062 Wheezing: Secondary | ICD-10-CM

## 2020-05-16 DIAGNOSIS — Z7901 Long term (current) use of anticoagulants: Secondary | ICD-10-CM | POA: Diagnosis not present

## 2020-05-16 DIAGNOSIS — F17210 Nicotine dependence, cigarettes, uncomplicated: Secondary | ICD-10-CM | POA: Diagnosis present

## 2020-05-16 DIAGNOSIS — R45851 Suicidal ideations: Secondary | ICD-10-CM | POA: Diagnosis present

## 2020-05-16 DIAGNOSIS — Z8673 Personal history of transient ischemic attack (TIA), and cerebral infarction without residual deficits: Secondary | ICD-10-CM

## 2020-05-16 DIAGNOSIS — Z86718 Personal history of other venous thrombosis and embolism: Secondary | ICD-10-CM | POA: Diagnosis not present

## 2020-05-16 DIAGNOSIS — I959 Hypotension, unspecified: Secondary | ICD-10-CM | POA: Diagnosis present

## 2020-05-16 DIAGNOSIS — J209 Acute bronchitis, unspecified: Secondary | ICD-10-CM | POA: Diagnosis present

## 2020-05-16 DIAGNOSIS — Z86711 Personal history of pulmonary embolism: Secondary | ICD-10-CM

## 2020-05-16 DIAGNOSIS — J449 Chronic obstructive pulmonary disease, unspecified: Secondary | ICD-10-CM | POA: Diagnosis present

## 2020-05-16 DIAGNOSIS — F29 Unspecified psychosis not due to a substance or known physiological condition: Secondary | ICD-10-CM | POA: Diagnosis not present

## 2020-05-16 LAB — PROTIME-INR
INR: 1 (ref 0.8–1.2)
INR: 1.1 (ref 0.8–1.2)
Prothrombin Time: 13.6 seconds (ref 11.4–15.2)
Prothrombin Time: 13.8 seconds (ref 11.4–15.2)

## 2020-05-16 MED ORDER — RISPERIDONE 0.5 MG PO TBDP
0.5000 mg | ORAL_TABLET | Freq: Every day | ORAL | Status: DC
  Administered 2020-05-16 – 2020-05-19 (×4): 0.5 mg via ORAL
  Filled 2020-05-16 (×9): qty 1

## 2020-05-16 MED ORDER — LORAZEPAM 1 MG PO TABS: 1.0000 mg | ORAL_TABLET | Freq: Four times a day (QID) | ORAL | Status: DC | PRN

## 2020-05-16 MED ORDER — ALBUTEROL SULFATE HFA 108 (90 BASE) MCG/ACT IN AERS
2.0000 | INHALATION_SPRAY | RESPIRATORY_TRACT | Status: DC | PRN
  Filled 2020-05-16 (×2): qty 6.7

## 2020-05-16 MED ORDER — RISPERIDONE 1 MG PO TBDP
1.0000 mg | ORAL_TABLET | Freq: Every day | ORAL | Status: DC
  Administered 2020-05-16 – 2020-05-18 (×3): 1 mg via ORAL
  Filled 2020-05-16 (×6): qty 1

## 2020-05-16 MED ORDER — RISPERIDONE 2 MG PO TBDP: 2.0000 mg | ORAL_TABLET | Freq: Three times a day (TID) | ORAL | Status: DC | PRN

## 2020-05-16 MED ORDER — APIXABAN 5 MG PO TABS
5.0000 mg | ORAL_TABLET | Freq: Two times a day (BID) | ORAL | Status: DC
  Administered 2020-05-16 (×2): 5 mg via ORAL
  Filled 2020-05-16 (×2): qty 1

## 2020-05-16 MED ORDER — ALBUTEROL SULFATE (2.5 MG/3ML) 0.083% IN NEBU
2.5000 mg | INHALATION_SOLUTION | Freq: Four times a day (QID) | RESPIRATORY_TRACT | Status: DC | PRN
  Administered 2020-05-17 – 2020-05-19 (×2): 2.5 mg via RESPIRATORY_TRACT
  Filled 2020-05-16 (×2): qty 3

## 2020-05-16 MED ORDER — LORATADINE 10 MG PO TABS
10.0000 mg | ORAL_TABLET | Freq: Every day | ORAL | Status: DC
  Administered 2020-05-16: 10 mg via ORAL
  Filled 2020-05-16: qty 1

## 2020-05-16 MED ORDER — ESCITALOPRAM OXALATE 10 MG PO TABS
20.0000 mg | ORAL_TABLET | Freq: Every day | ORAL | Status: DC
  Administered 2020-05-16: 20 mg via ORAL
  Filled 2020-05-16: qty 2

## 2020-05-16 MED ORDER — ZIPRASIDONE MESYLATE 20 MG IM SOLR: 20.0000 mg | Freq: Four times a day (QID) | INTRAMUSCULAR | Status: DC | PRN

## 2020-05-16 MED ORDER — APIXABAN 5 MG PO TABS
5.0000 mg | ORAL_TABLET | Freq: Two times a day (BID) | ORAL | Status: DC
  Administered 2020-05-16 – 2020-05-19 (×6): 5 mg via ORAL
  Filled 2020-05-16 (×13): qty 1

## 2020-05-16 MED ORDER — WARFARIN SODIUM 4 MG PO TABS: 4.0000 mg | ORAL_TABLET | Freq: Every day | ORAL | Status: DC

## 2020-05-16 MED ORDER — FLUTICASONE PROPIONATE 50 MCG/ACT NA SUSP
1.0000 | Freq: Every day | NASAL | Status: DC
  Administered 2020-05-16: 1 via NASAL
  Filled 2020-05-16: qty 16

## 2020-05-16 MED ORDER — ALBUTEROL SULFATE HFA 108 (90 BASE) MCG/ACT IN AERS
1.0000 | INHALATION_SPRAY | Freq: Four times a day (QID) | RESPIRATORY_TRACT | Status: DC | PRN
Start: 2020-05-16 — End: 2020-05-19
  Administered 2020-05-16 – 2020-05-18 (×6): 2 via RESPIRATORY_TRACT
  Filled 2020-05-16: qty 6.7

## 2020-05-16 MED ORDER — BENZONATATE 100 MG PO CAPS
200.0000 mg | ORAL_CAPSULE | Freq: Three times a day (TID) | ORAL | Status: DC | PRN
  Administered 2020-05-16 – 2020-05-18 (×5): 200 mg via ORAL
  Filled 2020-05-16 (×5): qty 2

## 2020-05-16 NOTE — ED Notes (Signed)
Pt given lunch tray and juice. Requesting and given cranberry juice and ginger ale with ice.

## 2020-05-16 NOTE — Plan of Care (Signed)
Newly admitted and adjusting well. Pleasant and cooperative and visible in the milieu, motivated for treatment.
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
## 2020-05-16 NOTE — ED Notes (Addendum)
Per chart, sister had taken home belongings Updated sister, Babette Relic about transfer to [facility].

## 2020-05-16 NOTE — Progress Notes (Signed)
Patient was accepted to [facility].  Meets inpatient criteria per Dr. Toni Amend.    Attending physician is Dr. Jola Babinski.    Notified Roxanne Gates, RN of acceptance.  Nurses call report to [phone].    Patient can arrive at 7:30PM on 05/16/2020.    Penni Homans, MSW, LCSW 05/16/2020 2:34 PM

## 2020-05-16 NOTE — ED Provider Notes (Signed)
Emergency Medicine Observation Re-evaluation Note  Anna Adkins is a 55 y.o. female, seen on rounds today.  Pt initially presented to the ED for complaints of Psychiatric Evaluation Currently, the patient is resting comfortably.  Physical Exam  BP 108/73 (BP Location: Left Arm)   Pulse 80   Temp 98 F (36.7 C) (Oral)   Resp 18   Ht 5\' 8"  (1.727 m)   Wt 125 kg   SpO2 95%   BMI 41.90 kg/m  Physical Exam Gen: No acute distress  Resp: Normal rise and fall of chest Neuro: Moving all four extremities Psych: Resting currently, calm and cooperative when awake    ED Course / MDM  EKG:   I have reviewed the labs performed to date as well as medications administered while in observation.  Recent changes in the last 24 hours include no acute events overnight.  Plan  Current plan is for psychiatric placement. Patient is under full IVC at this time.   Devlyn Retter, , DO 05/16/20 [phone]

## 2020-05-16 NOTE — BH Assessment (Addendum)
Adult MH  Referral information for Psychiatric Hospitalization faxed to:       [facility]   [address] [place] [phone]- [phone]),   [address] [phone]),   [facility] [phone] -or- [phone]),   [facility] [phone]) [phone]   [facility] [phone]),   [facility] [phone] or [phone])   [facility] [phone] or [phone])   [facility] [phone])

## 2020-05-16 NOTE — ED Notes (Signed)
Pt given breakfast tray and juice att. 

## 2020-05-16 NOTE — ED Notes (Signed)
Called for transport by [facility]

## 2020-05-16 NOTE — Progress Notes (Signed)
Patient ID: Anna Adkins, female   DOB: [birth year], 55 y.o.   MRN: [id number] Patient presents involuntarily secondary to increased disorganized thinking and behaviors. During this assessment, patient mentioned that a couple of days ago, she went to her cousin and saw a snake in their house and "flipped out". She reports that she has been experiencing flash backs from that incident and her thought process has changed since. She reports that she does not know how she got into the hospital but believes that her mother made it happen. She admits that she has not been thinking right for about a year because "a lot of things happened to me over the past couple of years, including health issues, moving to another place, "and some other life stuff". She reports that she has been diagnosed with depression and anxiety and depression and was put on medications.  Currently denies SI/HI/AVH and she is pleasant and cooperative. She reports positive psychosocial life: mother and sisters are supportive. Patient has four children: 2 are grown and living their own lives. She has a set of twins who are currently in college. Patient is on disability due to her health issues. She admits that she smokes up to a pack/day and not ready to quit. Medical issues include recent leg surgery for blood clots, bronchitis/Asthma and obesity. Patient admits that she has a PCP and a therapist in [place] but does not remember the names.  She is motivated for treatment and wants to work on her depression and wants to clear up her thought process. No skin issues noted upon assessment. Patient was admitted and oriented to the unit. Safety precautions initiated.

## 2020-05-16 NOTE — Progress Notes (Signed)
Pt stated she did not know why she was here. Pt encouraged to talk to the doctor in the morning. Pt given PRN Tessalon per MAR with HS medication.    05/16/20 2300  Psych Admission Type (Psych Patients Only)  Admission Status Involuntary  Psychosocial Assessment  Patient Complaints Anxiety  Eye Contact Fair  Facial Expression Anxious  Affect Anxious  Speech Logical/coherent  Interaction Assertive;Other (Comment) (pleasant)  Motor Activity Restless;Slow  Appearance/Hygiene Disheveled;In scrubs  Behavior Characteristics Anxious  Mood Anxious;Pleasant  Thought Process  Coherency Blocking;Concrete thinking;Loose associations  Content WDL  Delusions WDL  Perception WDL  Hallucination None reported or observed  Judgment Poor  Confusion WDL  Danger to Self  Current suicidal ideation? Denies  Danger to Others  Danger to Others None reported or observed

## 2020-05-16 NOTE — ED Notes (Signed)
Pt having visit with family member.

## 2020-05-16 NOTE — ED Notes (Signed)
Pt provides consent for both sister, Tammy and her son to call and receive updated and information on patient.

## 2020-05-17 ENCOUNTER — Inpatient Hospital Stay (HOSPITAL_COMMUNITY): Payer: Medicare Other

## 2020-05-17 DIAGNOSIS — F333 Major depressive disorder, recurrent, severe with psychotic symptoms: Principal | ICD-10-CM

## 2020-05-17 LAB — D-DIMER, QUANTITATIVE: D-Dimer, Quant: 0.64 ug/mL-FEU — ABNORMAL HIGH (ref 0.00–0.50)

## 2020-05-17 LAB — TSH: TSH: 3.038 u[IU]/mL (ref 0.350–4.500)

## 2020-05-17 MED ORDER — MENTHOL 3 MG MT LOZG: 1.0000 | LOZENGE | OROMUCOSAL | Status: DC | PRN

## 2020-05-17 MED ORDER — TRAZODONE HCL 50 MG PO TABS
50.0000 mg | ORAL_TABLET | Freq: Every day | ORAL | Status: DC
  Administered 2020-05-17 – 2020-05-18 (×3): 50 mg via ORAL
  Filled 2020-05-17 (×6): qty 1

## 2020-05-17 MED ORDER — HYDROXYZINE HCL 25 MG PO TABS
25.0000 mg | ORAL_TABLET | Freq: Three times a day (TID) | ORAL | Status: DC | PRN
  Administered 2020-05-17 – 2020-05-18 (×5): 25 mg via ORAL
  Filled 2020-05-17 (×5): qty 1
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
## 2020-05-17 NOTE — BHH Group Notes (Signed)
Adult Psychoeducational Group Note  Date:  05/17/2020 Time:  6:00 PM  Group Topic/Focus:  Goals Group:   The focus of this group is to help patients establish daily goals to achieve during treatment and discuss how the patient can incorporate goal setting into their daily lives to aide in recovery.  Participation Level:  Active  Participation Quality:  Appropriate and Attentive  Affect:  Appropriate  Cognitive:  Appropriate and Oriented  Insight: Appropriate and Good  Engagement in Group:  Engaged  Modes of Intervention:  Discussion  Additional Comments:  Pt attended and participated in goals group.   Anna Adkins 05/17/2020, 6:00 PM

## 2020-05-17 NOTE — [facility] Suicide Risk Assessment (Signed)
[facility] Admission Suicide Risk Assessment   Nursing information obtained from:  Patient Demographic factors:  Caucasian,Low socioeconomic status Current Mental Status:  NA Loss Factors:  Decline in physical health Historical Factors:  NA Risk Reduction Factors:  Positive social support,Sense of responsibility to family  Total Time spent with patient: 30 minutes Principal Problem: <principal problem not specified> Diagnosis:  Active Problems:   MDD (major depressive disorder), recurrent, severe, with psychosis (HCC)  Subjective Data: Patient is seen and examined.  Patient is a 55 year old female with a reported negative past psychiatric history who presented to the [facility] emergency department on 05/14/2020 after having brought there by emergency medical services with altered mental status.  The patient's mother felt as though the patient had been tangential and talking nonsense.  The patient was noted to be guarded, but was able to enter their emergency department.  She was evaluated by the emergency room physician and the history at that point was the patient had not been sleeping over the last 4 days prior to admission.  She apparently was awake and confused.  She was unable to talk to people.  She did state that she did not want to change anyone else's "timeline".  She believes and time travel.  Comprehensive clinical assessment was done on 05/14/2020.  During that evaluation the patient appeared alert and oriented x4.  She was noted to be calm and cooperative.  The patient reported at that time she had not been resting well, and reported that she had gone to [place] to help the patient's aunt.  She returned to the [place] area on the date of admission.  She described feeling dissociative and admitted that she was on medication for both her depression and anxiety.  On examination this a.m. she is unable to recall exactly what those medications are.  She denied suicidal or  homicidal ideation.  On the morning that I evaluated the patient she continued to deny this, but did endorse having auditory and visual hallucinations including having seen a snake in her aunts home.  Psychiatric consultation was obtained on 05/15/2020.  She was noted to be confused and making statements that made no sense.  She apparently received forced medication in the emergency department.  On the evaluation on 5/5 she was sedated, and was unable to clearly wake up.  It was reported that she had been prescribed Lexapro previously for her depression.  She was transferred to our facility for evaluation and stabilization.  She denied any auditory or visual hallucinations since having arrived at our facility.  She did keep her head under the blanket during the entire interview this AM.  She has a significant history of vascular disease and is on Eliquis for this.  She also continues to smoke and has a chronic cough suggestive of chronic bronchitis.  Last night on admission she was started on low-dose Risperdal at 0.5 mg p.o. daily and 1 mg p.o. nightly.  She denies any history of requiring oxygen at home, sleep apnea or other issues that could have potentially lead to apneic episodes.  Continued Clinical Symptoms:  Alcohol Use Disorder Identification Test Final Score (AUDIT): 0 The "Alcohol Use Disorders Identification Test", Guidelines for Use in Primary Care, Second Edition.  World Science writer [facility]). Score between 0-7:  no or low risk or alcohol related problems. Score between 8-15:  moderate risk of alcohol related problems. Score between 16-19:  high risk of alcohol related problems. Score 20 or above:  warrants further diagnostic evaluation for alcohol dependence and treatment.   CLINICAL FACTORS:   Severe Anxiety and/or Agitation Depression:   Anhedonia Impulsivity Currently Psychotic   Musculoskeletal: Strength & Muscle Tone: within normal limits Gait & Station: normal Patient  leans: N/A  Psychiatric Specialty Exam:  Presentation  General Appearance: Disheveled  Eye Contact:Minimal  Speech:Slow  Speech Volume:Decreased  Handedness:Right   Mood and Affect  Mood:Dysphoric  Affect:Blunt   Thought Process  Thought Processes:Goal Directed  Descriptions of Associations:Circumstantial  Orientation:Partial  Thought Content:Delusions; Paranoid Ideation  History of Schizophrenia/Schizoaffective disorder:No  Duration of Psychotic Symptoms:Less than six months  Hallucinations:Hallucinations: Auditory; Visual  Ideas of Reference:Delusions; Paranoia  Suicidal Thoughts:Suicidal Thoughts: No  Homicidal Thoughts:Homicidal Thoughts: No   Sensorium  Memory:Immediate Fair; Recent Fair; Remote Fair  Judgment:Fair  Insight:Fair   Executive Functions  Concentration:Fair  Attention Span:Fair  Recall:Fair  Fund of Knowledge:Fair  Language:Fair   Psychomotor Activity  Psychomotor Activity:Psychomotor Activity: Increased   Assets  Assets:Desire for Improvement; Resilience   Sleep  Sleep:Sleep: Fair Number of Hours of Sleep: 5    Physical Exam: Physical Exam Vitals and nursing note reviewed.  HENT:     Head: Normocephalic and atraumatic.  Pulmonary:     Effort: Pulmonary effort is normal.  Neurological:     General: No focal deficit present.     Mental Status: She is alert and oriented to person, place, and time.    ROS Blood pressure 131/67, pulse 90, temperature 98.7 F (37.1 C), temperature source Oral, resp. rate 20, height 5\' 8"  (1.727 m), weight 125 kg, SpO2 96 %. Body mass index is 41.9 kg/m.   COGNITIVE FEATURES THAT CONTRIBUTE TO RISK:  None    SUICIDE RISK:   Moderate:  Frequent suicidal ideation with limited intensity, and duration, some specificity in terms of plans, no associated intent, good self-control, limited dysphoria/symptomatology, some risk factors present, and identifiable protective factors,  including available and accessible social support.  PLAN OF CARE: Patient is seen and examined.  Patient is a 55 year old female with the above-stated past psychiatric history who was admitted to our facility for evaluation and stabilization.  She will be admitted to the hospital.  She will be integrated in the milieu.  She will be encouraged to attend groups.  As stated above I wrote for Risperdal 0.5 mg p.o. every morning and 1 mg p.o. nightly.  We will continue those medications.  We will also restart her Lexapro at 10 mg p.o. daily.  I will have pharmacy contact her primary pharmacy for confirmation of other medications.  Review of the PMP database revealed no benzodiazepines or other controlled substances.  As stated above we will continue her Eliquis for her vascular disease.  I have already started an albuterol inhaler as well as nebulized albuterol for cough and shortness of breath.  She was not placed on these at home that I am aware of.  She had a significant cough last night, and I have ordered a chest x-ray for today.  In the emergency department they did not address the possibility of a pulmonary emboli despite the fact that she has a history of a pulmonary emboli.  I have ordered a D-dimer for this morning.  Review of the electronic medical record revealed a history of anxiety, depression, DVT, pulmonary emboli as well as TIAs.  She is also had a history of post phlebitis in her lower extremities as well as cellulitis.  She also had a history of a fasciotomy.  Review of her admission laboratories revealed a slightly low potassium at 3.3.  That will be supplemented.  Her blood sugar was 121.  Creatinine was normal at 0.64.  Liver function enzymes were normal.  CBC was normal.  Her PT was 13.8, INR was 1.1.  Acetaminophen was less than 10, salicylate was less than 7.  Respiratory panel for influenza A, B and coronavirus were negative.  Her urinalysis at [facility] was considered hazy, with ketones and 20  mg per DL.  She had significant proteinuria with 30 mg per DL.  There was rare bacteria, 11-20 squamous epithelial cells, and 0-5 white blood cells.  Unfortunately the squamous epithelial cells show that it was a contaminated sample and we will repeat that.  Blood alcohol was less than 10.  Drug screen was completely negative.  A CT scan of the head which was obtained at [facility] was negative for any acute events.  Currently her vital signs are stable, she is afebrile.  Pulse oximetry this morning was 96% on room air.  She slept 5 hours last night.  I certify that inpatient services furnished can reasonably be expected to improve the patient's condition.   [name], MD 05/17/2020, 10:25 AM

## 2020-05-17 NOTE — Progress Notes (Signed)
Pt had coughing spell , pt given 2 puffs of her inhaler per MAR with +ve results

## 2020-05-17 NOTE — H&P (Signed)
Psychiatric Admission Assessment Adult  Patient Identification: Anna Adkins MRN:  [id number] Date of Evaluation:  05/17/2020 Chief Complaint:  MDD (major depressive disorder), recurrent, severe, with psychosis (HCC) [F33.3] Principal Diagnosis: <principal problem not specified> Diagnosis:  Active Problems:   MDD (major depressive disorder), recurrent, severe, with psychosis (HCC)  History of Present Illness: Patient is seen and examined.  Patient is a 55 year old female with a reported negative past psychiatric history who presented to the [facility] emergency department on 05/14/2020 after having brought there by emergency medical services with altered mental status.  The patient's mother felt as though the patient had been tangential and talking nonsense.  The patient was noted to be guarded, but was able to enter their emergency department.  She was evaluated by the emergency room physician and the history at that point was the patient had not been sleeping over the last 4 days prior to admission.  She apparently was awake and confused.  She was unable to talk to people.  She did state that she did not want to change anyone else's "timeline".  She believes and time travel.  Comprehensive clinical assessment was done on 05/14/2020.  During that evaluation the patient appeared alert and oriented x4.  She was noted to be calm and cooperative.  The patient reported at that time she had not been resting well, and reported that she had gone to [place] to help the patient's aunt.  She returned to the [place] area on the date of admission.  She described feeling dissociative and admitted that she was on medication for both her depression and anxiety.  On examination this a.m. she is unable to recall exactly what those medications are.  She denied suicidal or homicidal ideation.  On the morning that I evaluated the patient she continued to deny this, but did endorse having auditory  and visual hallucinations including having seen a snake in her aunts home.  Psychiatric consultation was obtained on 05/15/2020.  She was noted to be confused and making statements that made no sense.  She apparently received forced medication in the emergency department.  On the evaluation on 5/5 she was sedated, and was unable to clearly wake up.  It was reported that she had been prescribed Lexapro previously for her depression.  She was transferred to our facility for evaluation and stabilization.  She denied any auditory or visual hallucinations since having arrived at our facility.  She did keep her head under the blanket during the entire interview this AM.  She has a significant history of vascular disease and is on Eliquis for this.  She also continues to smoke and has a chronic cough suggestive of chronic bronchitis.  Last night on admission she was started on low-dose Risperdal at 0.5 mg p.o. daily and 1 mg p.o. nightly.  She denies any history of requiring oxygen at home, sleep apnea or other issues that could have potentially lead to apneic episodes.  Associated Signs/Symptoms: Depression Symptoms:  depressed mood, anhedonia, insomnia, psychomotor agitation, fatigue, difficulty concentrating, suicidal thoughts without plan, anxiety, loss of energy/fatigue, disturbed sleep, Duration of Depression Symptoms: No data recorded (Hypo) Manic Symptoms:  Hallucinations, Impulsivity, Irritable Mood, Labiality of Mood, Anxiety Symptoms:  Excessive Worry, Psychotic Symptoms:  Delusions, Hallucinations: Auditory Visual Paranoia, PTSD Symptoms: Negative Total Time spent with patient: 30 minutes  Past Psychiatric History: She has a reported history of depression and anxiety but has never been admitted to the psychiatric hospital.  She apparently  is followed at the local [facility] Center.  Is the patient at risk to self? Yes.    Has the patient been a risk to self in the past 6 months?  No.  Has the patient been a risk to self within the distant past? No.  Is the patient a risk to others? No.  Has the patient been a risk to others in the past 6 months? No.  Has the patient been a risk to others within the distant past? No.   Prior Inpatient Therapy:   Prior Outpatient Therapy:    Alcohol Screening: 1. How often do you have a drink containing alcohol?: Never 2. How many drinks containing alcohol do you have on a typical day when you are drinking?: 1 or 2 3. How often do you have six or more drinks on one occasion?: Never AUDIT-C Score: 0 4. How often during the last year have you found that you were not able to stop drinking once you had started?: Never 5. How often during the last year have you failed to do what was normally expected from you because of drinking?: Never 6. How often during the last year have you needed a first drink in the morning to get yourself going after a heavy drinking session?: Never 7. How often during the last year have you had a feeling of guilt of remorse after drinking?: Never 8. How often during the last year have you been unable to remember what happened the night before because you had been drinking?: Never 9. Have you or someone else been injured as a result of your drinking?: No 10. Has a relative or friend or a doctor or another health worker been concerned about your drinking or suggested you cut down?: No Alcohol Use Disorder Identification Test Final Score (AUDIT): 0 Substance Abuse History in the last 12 months:  No. Consequences of Substance Abuse: Negative Previous Psychotropic Medications: Yes  Psychological Evaluations: Yes  Past Medical History:  Past Medical History:  Diagnosis Date  . Anxiety   . Anxiety   . Arthritis   . Depression   . Depression   . DVT (deep venous thrombosis) (HCC)   . DVT of leg (deep venous thrombosis) (HCC) 2014  . Pulmonary emboli (HCC)   . TIA (transient ischemic attack) 2014    Past  Surgical History:  Procedure Laterality Date  . Blood clot removal    . ECTOPIC PREGNANCY SURGERY    . HERNIA REPAIR    . HERNIA REPAIR  2001  . SKIN GRAFT    . TUBAL LIGATION     Family History:  Family History  Problem Relation Age of Onset  . Healthy Mother   . Heart disease Father   . Diabetes Father   . Asthma Father   . Breast cancer Neg Hx    Family Psychiatric  History: Unknown Tobacco Screening: Have you used any form of tobacco in the last 30 days? (Cigarettes, Smokeless Tobacco, Cigars, and/or Pipes): Yes Tobacco use, Select all that apply: 5 or more cigarettes per day Are you interested in Tobacco Cessation Medications?: No, patient refused Counseled patient on smoking cessation including recognizing danger situations, developing coping skills and basic information about quitting provided: Yes Social History:  Social History   Substance and Sexual Activity  Alcohol Use Yes  . Alcohol/week: 1.0 standard drink  . Types: 1 Glasses of wine per week   Comment: occasional     Social History   Substance and  Sexual Activity  Drug Use No    Additional Social History: Marital status: Single Are you sexually active?: No What is your sexual orientation?: men Has your sexual activity been affected by drugs, alcohol, medication, or emotional stress?: no Does patient have children?: Yes How many children?: 4 How is patient's relationship with their children?: 1,52 year old son and 32 year old twins who are in college.                         Allergies:   Allergies  Allergen Reactions  . Sulfa Antibiotics Anaphylaxis   Lab Results:  Results for orders placed or performed during the hospital encounter of 05/16/20 (from the past 48 hour(s))  Protime-INR     Status: None   Collection Time: 05/16/20  5:45 PM  Result Value Ref Range   Prothrombin Time 13.8 11.4 - 15.2 seconds   INR 1.1 0.8 - 1.2    Comment: (NOTE) INR goal varies based on device and  disease states. Performed at [facility], [address], [place], [place] [address]     Blood Alcohol level:  Lab Results  Component Value Date   ETH <10 05/14/2020    Metabolic Disorder Labs:  No results found for: HGBA1C, MPG No results found for: PROLACTIN No results found for: CHOL, TRIG, HDL, CHOLHDL, VLDL, LDLCALC  Current Medications: Current Facility-Administered Medications  Medication Dose Route Frequency Provider Last Rate Last Admin  . albuterol (PROVENTIL) (2.5 MG/3ML) 0.083% nebulizer solution 2.5 mg  2.5 mg Nebulization Q6H PRN [name], MD      . albuterol (VENTOLIN HFA) 108 (90 Base) MCG/ACT inhaler 1-2 puff  1-2 puff Inhalation Q6H PRN [name], MD   2 puff at 05/17/20 1121  . apixaban (ELIQUIS) tablet 5 mg  5 mg Oral BID [name], MD   5 mg at 05/17/20 0911  . benzonatate (TESSALON) capsule 200 mg  200 mg Oral TID PRN [name], MD   200 mg at 05/17/20 0923  . hydrOXYzine (ATARAX/VISTARIL) tablet 25 mg  25 mg Oral TID PRN [name], [name], NP   25 mg at 05/17/20 0912  . risperiDONE (RISPERDAL M-TABS) disintegrating tablet 2 mg  2 mg Oral Q8H PRN [name], MD       And  . LORazepam (ATIVAN) tablet 1 mg  1 mg Oral Q6H PRN [name], MD       And  . ziprasidone (GEODON) injection 20 mg  20 mg Intramuscular Q6H PRN [name], MD      . menthol-cetylpyridinium (CEPACOL) lozenge 3 mg  1 lozenge Oral PRN [name], [name], NP      . risperiDONE (RISPERDAL M-TABS) disintegrating tablet 0.5 mg  0.5 mg Oral Daily [name], MD   0.5 mg at 05/17/20 0911  . risperiDONE (RISPERDAL M-TABS) disintegrating tablet 1 mg  1 mg Oral QHS [name], MD   1 mg at 05/16/20 2112  . traZODone (DESYREL) tablet 50 mg  50 mg Oral QHS [name], [name], NP   50 mg at 05/17/20 0040   PTA Medications: Medications Prior to Admission  Medication Sig Dispense Refill Last Dose  . acetaminophen  (TYLENOL) 500 MG tablet Take 500 mg by mouth every 6 (six) hours as needed.     . Biotin 1000 MCG tablet Take 1,000 mcg by mouth daily. (Patient not taking: No sig reported)     .  ELIQUIS 5 MG TABS tablet Take 5 mg by mouth 2 (two) times daily.     [name] escitalopram (LEXAPRO) 20 MG tablet Take 20 mg by mouth daily.      . fluticasone (FLONASE) 50 MCG/ACT nasal spray Place 1 spray into both nostrils daily.     [name] loratadine (CLARITIN) 10 MG tablet Take 10 mg by mouth daily.     . Multiple Vitamin (MULTIVITAMIN WITH MINERALS) TABS tablet Take 1 tablet by mouth daily.     . VENTOLIN HFA 108 (90 Base) MCG/ACT inhaler      . warfarin (COUMADIN) 4 MG tablet Take 4 mg by mouth daily.       Musculoskeletal: Strength & Muscle Tone: decreased Gait & Station: shuffle Patient leans: N/A            Psychiatric Specialty Exam:  Presentation  General Appearance: Disheveled  Eye Contact:Minimal  Speech:Slow  Speech Volume:Decreased  Handedness:Right   Mood and Affect  Mood:Dysphoric  Affect:Blunt   Thought Process  Thought Processes:Goal Directed  Duration of Psychotic Symptoms: Less than six months  Past Diagnosis of Schizophrenia or Psychoactive disorder: No  Descriptions of Associations:Circumstantial  Orientation:Partial  Thought Content:Delusions; Paranoid Ideation  Hallucinations:Hallucinations: Auditory; Visual  Ideas of Reference:Delusions; Paranoia  Suicidal Thoughts:Suicidal Thoughts: No  Homicidal Thoughts:Homicidal Thoughts: No   Sensorium  Memory:Immediate Fair; Recent Fair; Remote Fair  Judgment:Fair  Insight:Fair   Executive Functions  Concentration:Fair  Attention Span:Fair  Recall:Fair  Fund of Knowledge:Fair  Language:Fair   Psychomotor Activity  Psychomotor Activity:Psychomotor Activity: Increased   Assets  Assets:Desire for Improvement; Resilience   Sleep  Sleep:Sleep: Fair Number of Hours of Sleep: 5    Physical  Exam: Physical Exam Vitals and nursing note reviewed.  HENT:     Head: Normocephalic and atraumatic.  Pulmonary:     Effort: Pulmonary effort is normal.  Neurological:     General: No focal deficit present.     Mental Status: She is alert and oriented to person, place, and time.    ROS Blood pressure (!) 105/59, pulse 93, temperature 98.7 F (37.1 C), temperature source Oral, resp. rate 20, height  (1.727 m), weight 125 kg, SpO2 96 %. Body mass index is 41.9 kg/m.  Treatment Plan Summary: Daily contact with patient to assess and evaluate symptoms and progress in treatment, Medication management and Plan : Patient is seen and examined.  Patient is a 55 year old female with the above-stated past psychiatric history who was admitted to our facility for evaluation and stabilization.  She will be admitted to the hospital.  She will be integrated in the milieu.  She will be encouraged to attend groups.  As stated above I wrote for Risperdal 0.5 mg p.o. every morning and 1 mg p.o. nightly.  We will continue those medications.  We will also restart her Lexapro at 10 mg p.o. daily.  I will have pharmacy contact her primary pharmacy for confirmation of other medications.  Review of the PMP database revealed no benzodiazepines or other controlled substances.  As stated above we will continue her Eliquis for her vascular disease.  I have already started an albuterol inhaler as well as nebulized albuterol for cough and shortness of breath.  She was not placed on these at home that I am aware of.  She had a significant cough last night, and I have ordered a chest x-ray for today.  In the emergency department they did not address the possibility of a pulmonary emboli despite  the fact that she has a history of a pulmonary emboli.  I have ordered a D-dimer for this morning.  Review of the electronic medical record revealed a history of anxiety, depression, DVT, pulmonary emboli as well as TIAs.  She is also  had a history of post phlebitis in her lower extremities as well as cellulitis.  She also had a history of a fasciotomy.  Review of her admission laboratories revealed a slightly low potassium at 3.3.  That will be supplemented.  Her blood sugar was 121.  Creatinine was normal at 0.64.  Liver function enzymes were normal.  CBC was normal.  Her PT was 13.8, INR was 1.1.  Acetaminophen was less than 10, salicylate was less than 7.  Respiratory panel for influenza A, B and coronavirus were negative.  Her urinalysis at [facility] was considered hazy, with ketones and 20 mg per DL.  She had significant proteinuria with 30 mg per DL.  There was rare bacteria, 11-20 squamous epithelial cells, and 0-5 white blood cells.  Unfortunately the squamous epithelial cells show that it was a contaminated sample and we will repeat that.  Blood alcohol was less than 10.  Drug screen was completely negative.  A CT scan of the head which was obtained at [facility] was negative for any acute events.  Currently her vital signs are stable, she is afebrile.  Pulse oximetry this morning was 96% on room air.  She slept 5 hours last night.  Observation Level/Precautions:  15 minute checks  Laboratory:  Chemistry Profile  Psychotherapy:    Medications:    Consultations:    Discharge Concerns:    Estimated LOS:  Other:     Physician Treatment Plan for Primary Diagnosis: <principal problem not specified> Long Term Goal(s): Improvement in symptoms so as ready for discharge  Short Term Goals: Ability to identify changes in lifestyle to reduce recurrence of condition will improve, Ability to verbalize feelings will improve, Ability to disclose and discuss suicidal ideas, Ability to demonstrate self-control will improve, Ability to identify and develop effective coping behaviors will improve, Ability to maintain clinical measurements within normal limits will improve and Compliance with prescribed medications will  improve  Physician Treatment Plan for Secondary Diagnosis: Active Problems:   MDD (major depressive disorder), recurrent, severe, with psychosis (HCC)  Long Term Goal(s): Improvement in symptoms so as ready for discharge  Short Term Goals: Ability to identify changes in lifestyle to reduce recurrence of condition will improve, Ability to verbalize feelings will improve, Ability to disclose and discuss suicidal ideas, Ability to demonstrate self-control will improve, Ability to identify and develop effective coping behaviors will improve, Ability to maintain clinical measurements within normal limits will improve and Compliance with prescribed medications will improve  I certify that inpatient services furnished can reasonably be expected to improve the patient's condition.    [name], MD 5/7/20222:47 PM

## 2020-05-17 NOTE — Progress Notes (Signed)
Pt had coughing spell ,pt given PRN  Tessalon per [facility]

## 2020-05-17 NOTE — Progress Notes (Signed)
Pt was having issue with coughing , so pt was moved to bed 1 which is medical bed and the [facility] was elevated to help pt breath better . NP-ENE ordered Trazodone for sleep and Vistaril for anxiety and were given to pt per [facility]

## 2020-05-17 NOTE — Progress Notes (Signed)
Pt visible on the unit some. Pt state she was feeling better today. Pt given PRN Vistaril and Nebulizer Tx per [facility] with HS medications.     05/17/20 2200  Psych Admission Type (Psych Patients Only)  Admission Status Involuntary  Psychosocial Assessment  Patient Complaints Anxiety;Worrying  Eye Contact Fair  Facial Expression Anxious  Affect Anxious  Speech Logical/coherent  Interaction Assertive;Other (Comment) (pleasant)  Motor Activity Restless;Slow  Appearance/Hygiene Unremarkable  Behavior Characteristics Cooperative;Appropriate to situation  Mood Pleasant;Anxious  Thought Process  Coherency Blocking;Concrete thinking;Loose associations  Content WDL  Delusions WDL  Perception Hallucinations  Hallucination Visual  Judgment Poor  Confusion WDL  Danger to Self  Current suicidal ideation? Denies  Danger to Others  Danger to Others None reported or observed

## 2020-05-17 NOTE — [facility] Group Notes (Signed)
.  Psychoeducational Group Note  Date: 05/17/2020 Time: 0900-1000    Goal Setting   Purpose of Group: This group helps to provide patients with the steps of setting a goal that is specific, measurable, attainable, realistic and time specific. A discussion on how we keep ourselves stuck with negative self talk.    Participation Level:  Attended  Participation Quality:  Appropriate  Affect:  Appropriate  Cognitive:  Appropriate  Insight:  Improving  Engagement in Group:  Engaged  Additional Comments:  .Marland KitchenMarland KitchenPt attended the group. Rates her energy at a 0/.10. Pt participated in the group and gave good ffeedback. Given homework of writing 20 positive attributes about herelf  Dione Housekeeper

## 2020-05-17 NOTE — Progress Notes (Signed)
   05/17/20 0500  Sleep  Number of Hours 5

## 2020-05-17 NOTE — Progress Notes (Signed)
Spoke with Marchelle Folks - Chest x ray scheduled for 2pm today.Patient and Charge nurse aware.

## 2020-05-17 NOTE — Progress Notes (Signed)
Pt denied SI/HI/AVH.  Pt has a bad cough which pt says is bronchitis and allergies.  Pt smokes cigarettes.  MHT accompanied pt to [facility] this afternoon for chest xray.    Pt is back on the unit and in no acute distress.  RN established rapport with pt and assessed for needs and concerns.  RN administered medications per provider order.  RN will continue to monitor pt's progress and offer support

## 2020-05-18 LAB — URINALYSIS, COMPLETE (UACMP) WITH MICROSCOPIC
Bacteria, UA: NONE SEEN
Bilirubin Urine: NEGATIVE
Glucose, UA: NEGATIVE mg/dL
Hgb urine dipstick: NEGATIVE
Ketones, ur: NEGATIVE mg/dL
Leukocytes,Ua: NEGATIVE
Nitrite: NEGATIVE
Protein, ur: NEGATIVE mg/dL
Specific Gravity, Urine: 1.009 (ref 1.005–1.030)
pH: 6 (ref 5.0–8.0)

## 2020-05-18 LAB — LIPID PANEL
Cholesterol: 173 mg/dL (ref 0–200)
HDL: 37 mg/dL — ABNORMAL LOW (ref >40)
LDL Cholesterol: 91 mg/dL (ref 0–99)
Total CHOL/HDL Ratio: 4.7 RATIO
Triglycerides: 224 mg/dL — ABNORMAL HIGH (ref <150)
VLDL: 45 mg/dL — ABNORMAL HIGH (ref 0–40)

## 2020-05-18 MED ORDER — ACETAMINOPHEN 325 MG PO TABS
650.0000 mg | ORAL_TABLET | Freq: Four times a day (QID) | ORAL | Status: DC | PRN
  Administered 2020-05-18: 650 mg via ORAL
  Filled 2020-05-18: qty 2

## 2020-05-18 MED ORDER — DOXYCYCLINE HYCLATE 100 MG PO TABS
100.0000 mg | ORAL_TABLET | Freq: Two times a day (BID) | ORAL | Status: DC
  Administered 2020-05-18 – 2020-05-19 (×3): 100 mg via ORAL
  Filled 2020-05-18 (×8): qty 1

## 2020-05-18 MED ORDER — ESCITALOPRAM OXALATE 10 MG PO TABS
10.0000 mg | ORAL_TABLET | Freq: Every day | ORAL | Status: DC
  Administered 2020-05-18 – 2020-05-19 (×2): 10 mg via ORAL
  Filled 2020-05-18 (×5): qty 1

## 2020-05-18 NOTE — Progress Notes (Signed)
Pt up with coughing spell, pt given PRN Inhaler per MAR .

## 2020-05-18 NOTE — Progress Notes (Signed)
Adult Psychoeducational Group Note  Date:  05/18/2020 Time:  12:22 AM  Group Topic/Focus:  Wrap-Up Group:   The focus of this group is to help patients review their daily goal of treatment and discuss progress on daily workbooks.  Participation Level:  Did Not Attend  Participation Quality:  Did Not Attend  Affect:  Did Not Attend  Cognitive:  Did Not Attend  Insight: None  Engagement in Group:  Did Not Attend  Modes of Intervention:  Did Not Attend  Additional Comments:  Pt did not attend evening wrap up group tonight.  Felipa Furnace 05/18/2020, 12:22 AM

## 2020-05-18 NOTE — Progress Notes (Signed)
   05/18/20 0500  Sleep  Number of Hours 6

## 2020-05-18 NOTE — Progress Notes (Signed)
Pt stated she was doing better, pt visible on the unit much of the evening. Pt given PRN Tessalon and Vistaril per MAR with HS medication. Pt requested something for pain, NP-Cody put Tylenol PRN , pt fell asleep before the medication would clear will continue to monitor if pt will need later.      05/18/20 2100  Psych Admission Type (Psych Patients Only)  Admission Status Involuntary  Psychosocial Assessment  Patient Complaints Anxiety  Eye Contact Fair  Facial Expression Anxious  Affect Anxious  Speech Logical/coherent  Interaction Assertive;Other (Comment) (pleasant)  Motor Activity Restless;Slow  Appearance/Hygiene Unremarkable  Behavior Characteristics Anxious  Mood Depressed  Thought Process  Coherency WDL  Content WDL  Delusions WDL  Perception WDL  Hallucination None reported or observed  Judgment Poor  Confusion WDL  Danger to Self  Current suicidal ideation? Denies  Danger to Others  Danger to Others None reported or observed

## 2020-05-18 NOTE — Plan of Care (Signed)
Nurse discussed anxiety, depression and coping skills with patient.  

## 2020-05-18 NOTE — [facility] Suicide Risk Assessment (Addendum)
[facility] INPATIENT:  Family/Significant Other Suicide Prevention Education   Sister states the snake incident did actually happen.  Patient is very afraid of snakes and did see one in cousin's house.  This happened the day after another event that upset the patient.  Sister (who helps take care of a lot of patient's affairs) had gone out of town and patient was unable to reach her, which put her into a state of complete panic.  Additionally sister reports that it is possible the patient ran out of her Lexapro because she was staying with mother and stayed 6 weeks instead of 3 weeks.  CSW worked with sister to see each of these events as possibly contributing to the state in which patient was finally hospitalized.  Sister is still concerned about patient being sufficiently stable, stating that as recently as last night she was still talking about her timeline being messed up.  CSW shared diagnosis and medications.  Family wants to know whether patient needs 24-hour supervision at first, since she does live alone, and pros/cons of this were discussed.  Sister was encouraged to call patient's nurse to follow up on whether she needs clothing and how to do a FaceTime call with patient since she cannot visit in person.  Suicide Prevention Education:  Education Completed; -Oretha Caprice (sister) [phone],  (name of family member/significant other) has been identified by the patient as the family member/significant other with whom the patient will be residing, and identified as the person(s) who will aid the patient in the event of a mental health crisis (suicidal ideations/suicide attempt).  With written consent from the patient, the family member/significant other has been provided the following suicide prevention education, prior to the and/or following the discharge of the patient.  The suicide prevention education provided includes the following:  Suicide risk factors  Suicide prevention and  interventions  National Suicide Hotline telephone number  [facility] assessment telephone number  [facility] Emergency Assistance 911  [facility] and/or Residential Mobile Crisis Unit telephone number  Request made of family/significant other to:  Remove weapons (e.g., guns, rifles, knives), all items previously/currently identified as safety concern.    Remove drugs/medications (over-the-counter, prescriptions, illicit drugs), all items previously/currently identified as a safety concern.  The family member/significant other verbalizes understanding of the suicide prevention education information provided.  The family member/significant other agrees to remove the items of safety concern listed above.  Carloyn Jaeger Grossman-Orr 05/18/2020, 3:22 PM

## 2020-05-18 NOTE — Plan of Care (Signed)
  Problem: Education: Goal: Ability to state activities that reduce stress will improve Outcome: Progressing   Problem: Self-Concept: Goal: Ability to identify factors that promote anxiety will improve Outcome: Progressing Goal: Level of anxiety will decrease Outcome: Progressing   Problem: Coping: Goal: Coping ability will improve Outcome: Progressing   Problem: Education: Goal: Emotional status will improve Outcome: Progressing   Problem: Coping: Goal: Ability to demonstrate self-control will improve Outcome: Progressing

## 2020-05-18 NOTE — B[facility] Group Notes (Signed)
Adult Psychoeducational Group Not Date:  05/18/2020 Time:  0900-1045 Group Topic/Focus: PROGRESSIVE RELAXATION. A group where deep breathing is taught and tensing and relaxation muscle groups is used. Imagery is used as well.  Pts are asked to imagine 3 pillars that hold them up when they are not able to hold themselves up.  Participation Level:  Active  Participation Quality:  Appropriate  Affect:  Appropriate  Cognitive:  Oriented  Insight: Improving  Engagement in Group:  Engaged  Modes of Intervention:  Activity, Discussion, Education, and Support  Additional Comments:  Rates her energy at a 10. States what holds her up is her twins, her mother, and her 4 sisters.  Dione Housekeeper

## 2020-05-18 NOTE — Progress Notes (Signed)
[facility] MD Progress Note  05/18/2020 12:13 PM Anna Adkins  MRN:  [id number] Subjective: Patient is a 55 year old female with a reported past psychiatric history significant for depression and anxiety who originally presented to the [facility] emergency department on 05/14/2020 after having been brought there by emergency medical services with altered mental status.  The patient was reportedly tangential and talking nonsense.  Objective: Patient is seen and examined.  Patient is a 55 year old female with the above-stated past psychiatric history who is seen in follow-up.  She is significantly better today.  Yesterday on examination she kept her head under the blanket the entire time we were speaking.  Today she is able to come into the office and discuss things.  She also asked me whether or not she had kept her head under the blanket yesterday during the examination, and I told her she had.  She stated she does not understand why she did that in the first place.  She is alert and oriented x3 today.  She is very pleasant.  She denied any auditory or visual hallucinations.  She denied any suicidal or homicidal ideation.  When we discussed the psychotic symptoms and her change in mental status she believes that after having seen "the snake" in her relatives home in [place] that that "freaked me out".  Unfortunately we do not know whether or not that was visual hallucinations or whether she really and truly saw that.  We discussed the possibility of perhaps being transferred to the [facility] today, but she stated that she prefers to stay here because she is "made friends".  She denied any suicidal or homicidal ideation.  She denied any visual or auditory hallucinations.  She stated she is tolerating her medications without difficulty.  Her cough has continued, and I obtained a chest x-ray yesterday.  It looks like she may have acute bronchitis.  I have started her on doxycycline today.   She does request getting Tessalon Perles at discharge for her cough.  Labs from 5/7 showed a TSH at 3.038.  Her chest x-ray from yesterday showed mild bronchitic changes.  Her blood pressure remains mildly low at 85/51.  Pulse was 72.  Pulse oximetry on room air was 92%.  She slept 6 hours last night.  Principal Problem: <principal problem not specified> Diagnosis: Active Problems:   MDD (major depressive disorder), recurrent, severe, with psychosis (HCC)  Total Time spent with patient: 20 minutes  Past Psychiatric History: See admission H&P  Past Medical History:  Past Medical History:  Diagnosis Date  . Anxiety   . Anxiety   . Arthritis   . Depression   . Depression   . DVT (deep venous thrombosis) (HCC)   . DVT of leg (deep venous thrombosis) (HCC) 2014  . Pulmonary emboli (HCC)   . TIA (transient ischemic attack) 2014    Past Surgical History:  Procedure Laterality Date  . Blood clot removal    . ECTOPIC PREGNANCY SURGERY    . HERNIA REPAIR    . HERNIA REPAIR  2001  . SKIN GRAFT    . TUBAL LIGATION     Family History:  Family History  Problem Relation Age of Onset  . Healthy Mother   . Heart disease Father   . Diabetes Father   . Asthma Father   . Breast cancer Neg Hx    Family Psychiatric  History: See admission H&P Social History:  Social History   Substance and  Sexual Activity  Alcohol Use Yes  . Alcohol/week: 1.0 standard drink  . Types: 1 Glasses of wine per week   Comment: occasional     Social History   Substance and Sexual Activity  Drug Use No    Social History   Socioeconomic History  . Marital status: Single    Spouse name: Not on file  . Number of children: 4  . Years of education: Not on file  . Highest education level: Not on file  Occupational History  . Not on file  Tobacco Use  . Smoking status: Current Every Day Smoker    Packs/day: 1.00    Years: 20.00    Pack years: 20.00  . Smokeless tobacco: Never Used  Vaping Use  .  Vaping Use: Never used  Substance and Sexual Activity  . Alcohol use: Yes    Alcohol/week: 1.0 standard drink    Types: 1 Glasses of wine per week    Comment: occasional  . Drug use: No  . Sexual activity: Not on file  Other Topics Concern  . Not on file  Social History Narrative  . Not on file   Social Determinants of Health   Financial Resource Strain: Not on file  Food Insecurity: Not on file  Transportation Needs: Not on file  Physical Activity: Not on file  Stress: Not on file  Social Connections: Not on file   Additional Social History:                         Sleep: Fair  Appetite:  Good  Current Medications: Current Facility-Administered Medications  Medication Dose Route Frequency Provider Last Rate Last Admin  . albuterol (PROVENTIL) (2.5 MG/3ML) 0.083% nebulizer solution 2.5 mg  2.5 mg Nebulization Q6H PRN [name], MD   2.5 mg at 05/17/20 2027  . albuterol (VENTOLIN HFA) 108 (90 Base) MCG/ACT inhaler 1-2 puff  1-2 puff Inhalation Q6H PRN [name], MD   2 puff at 05/18/20 0448  . apixaban (ELIQUIS) tablet 5 mg  5 mg Oral BID [name], MD   5 mg at 05/18/20 9798  . benzonatate (TESSALON) capsule 200 mg  200 mg Oral TID PRN [name], MD   200 mg at 05/18/20 0801  . doxycycline (VIBRA-TABS) tablet 100 mg  100 mg Oral Q12H [name], MD   100 mg at 05/18/20 1000  . hydrOXYzine (ATARAX/VISTARIL) tablet 25 mg  25 mg Oral TID PRN [name], [name], NP   25 mg at 05/18/20 0800  . risperiDONE (RISPERDAL M-TABS) disintegrating tablet 2 mg  2 mg Oral Q8H PRN [name], MD       And  . LORazepam (ATIVAN) tablet 1 mg  1 mg Oral Q6H PRN [name], MD       And  . ziprasidone (GEODON) injection 20 mg  20 mg Intramuscular Q6H PRN [name], MD      . menthol-cetylpyridinium (CEPACOL) lozenge 3 mg  1 lozenge Oral PRN [name], [name], NP      . risperiDONE (RISPERDAL M-TABS) disintegrating tablet  0.5 mg  0.5 mg Oral Daily [name], MD   0.5 mg at 05/18/20 0811  . risperiDONE (RISPERDAL M-TABS) disintegrating tablet 1 mg  1 mg Oral QHS [name], MD   1 mg at 05/17/20 2054  . traZODone (DESYREL) tablet 50 mg  50 mg Oral QHS [name], [name],  NP   50 mg at 05/17/20 2053    Lab Results:  Results for orders placed or performed during the hospital encounter of 05/16/20 (from the past 48 hour(s))  Protime-INR     Status: None   Collection Time: 05/16/20  5:45 PM  Result Value Ref Range   Prothrombin Time 13.8 11.4 - 15.2 seconds   INR 1.1 0.8 - 1.2    Comment: (NOTE) INR goal varies based on device and disease states. Performed at [facility], [address] [place], [place] [address]   D-dimer, quantitative     Status: Abnormal   Collection Time: 05/17/20  5:39 PM  Result Value Ref Range   D-Dimer, Quant 0.64 (H) 0.00 - 0.50 ug/mL-FEU    Comment: (NOTE) At the manufacturer cut-off value of 0.5 g/mL FEU, this assay has a negative predictive value of 95-100%.This assay is intended for use in conjunction with a clinical pretest probability (PTP) assessment model to exclude pulmonary embolism (PE) and deep venous thrombosis (DVT) in outpatients suspected of PE or DVT. Results should be correlated with clinical presentation. Performed at [facility], [address] [place], [place] [address]   TSH     Status: None   Collection Time: 05/17/20  5:39 PM  Result Value Ref Range   TSH 3.038 0.350 - 4.500 uIU/mL    Comment: Performed by a 3rd Generation assay with a functional sensitivity of <=0.01 uIU/mL. Performed at [facility], [address] [place], [place] [address]     Blood Alcohol level:  Lab Results  Component Value Date   ETH <10 05/14/2020    Metabolic Disorder Labs: No results found for: HGBA1C, MPG No results found for: PROLACTIN No results found for: CHOL, TRIG, HDL, CHOLHDL,  VLDL, LDLCALC  Physical Findings: AIMS:  , ,  ,  ,    CIWA:    COWS:     Musculoskeletal: Strength & Muscle Tone: within normal limits Gait & Station: normal Patient leans: N/A  Psychiatric Specialty Exam:  Presentation  General Appearance: Casual  Eye Contact:Good  Speech:Normal Rate  Speech Volume:Normal  Handedness:Right   Mood and Affect  Mood:Euthymic  Affect:Congruent   Thought Process  Thought Processes:Coherent  Descriptions of Associations:Intact  Orientation:Full (Time, Place and Person)  Thought Content:Logical  History of Schizophrenia/Schizoaffective disorder:No  Duration of Psychotic Symptoms:Less than six months  Hallucinations:Hallucinations: None  Ideas of Reference:None  Suicidal Thoughts:Suicidal Thoughts: No  Homicidal Thoughts:Homicidal Thoughts: No   Sensorium  Memory:Immediate Good; Recent Good; Remote Good  Judgment:Good  Insight:Good   Executive Functions  Concentration:Good  Attention Span:Good  Recall:Good  Fund of Knowledge:Good  Language:Good   Psychomotor Activity  Psychomotor Activity:Psychomotor Activity: Increased   Assets  Assets:Desire for Improvement; Resilience   Sleep  Sleep:Sleep: Good Number of Hours of Sleep: 6    Physical Exam: Physical Exam Vitals and nursing note reviewed.  Constitutional:      Appearance: Normal appearance.  HENT:     Head: Normocephalic and atraumatic.  Pulmonary:     Effort: Pulmonary effort is normal.  Neurological:     General: No focal deficit present.     Mental Status: She is alert and oriented to person, place, and time.    ROS Blood pressure (!) 85/51, pulse 72, temperature 97.7 F (36.5 C), temperature source Oral, resp. rate 20, height 5\' 8"  (1.727 m), weight 125 kg, SpO2 92 %. Body mass index is 41.9 kg/m.   Treatment Plan  Summary: Daily contact with patient to assess and evaluate symptoms and progress in treatment, Medication  management and Plan : Patient is seen and examined.  Patient is a 55 year old female with the above-stated past psychiatric history who is seen in follow-up.   Diagnosis: 1.  Acute psychosis thought perhaps to be related to major depression with psychotic features or perhaps an SSRI discontinuation problem. 2.  COPD. 3.  Chronic anticoagulation with Eliquis. 4.  History of hematuria. 5.  History of iliac artery area occlusion 6.  History of phlebitis  Pertinent findings on examination today: 1.  Complete resolution of psychotic symptoms. 2.  Improvement in mood. 3.  Chest x-ray suggestive of mild bronchitic changes. 4.  Continued cough and marginally low O2 saturation 5.  Low blood pressure  Plan: 1.  Continue albuterol nebulization or inhalation 2.  Continue Eliquis 5 mg p.o. twice daily for chronic anticoagulation. 3.  Continue Tessalon Perles 200 mg p.o. 3 times daily as needed cough. 4.  Start doxycycline 100 mg p.o. every 12 hours for acute bronchitis. 5.  She still needs an EKG. 6.  Continue hydroxyzine 25 mg p.o. 3 times daily as needed anxiety. 7.  Continue Risperdal 0.5 mg p.o. daily and 1 mg p.o. nightly for psychosis. 8.  Continue trazodone 50 mg p.o. nightly as needed insomnia. 9.  We still need to repeat her urinalysis. 10.  Restart Lexapro at 5 mg p.o. daily for anxiety and depression. 11.  Disposition planning-if she continues to improve I would anticipate discharge in 1 to 2 days.  [name], MD 05/18/2020, 12:13 PM

## 2020-05-18 NOTE — Plan of Care (Signed)
  Problem: Education: Goal: Ability to state activities that reduce stress will improve Outcome: Progressing   Problem: Self-Concept: Goal: Ability to identify factors that promote anxiety will improve Outcome: Progressing   Problem: Education: Goal: Knowledge of the prescribed therapeutic regimen will improve Outcome: Progressing   Problem: Coping: Goal: Coping ability will improve Outcome: Progressing   Problem: Coping: Goal: Will verbalize feelings Outcome: Progressing

## 2020-05-18 NOTE — Progress Notes (Signed)
D:  Patient's self inventory sheet, patient sleeps good, no sleep medicine given.  Good appetite, normal energy level, good concentration.  Rated depression 2, denied hopeless, anxiety 1.  Denied withdrawals.  Denied SI.  Physical problems.  Physical pain, worst pain #3 in past 24 hours.  Goal is listen to others and tryout what works for them.  Plans to do whatever it takes.  "Thanks for caring for all of [place]."  No discharge plans. A:  Medications administered per MD orders.  Emotional support and encouragement given patient. R:  Safety maintained with 15 minute checks.  Patient denied SI and HI, contracts for safety.  Denied A/V hallucinations.

## 2020-05-19 LAB — HEMOGLOBIN A1C
Hgb A1c MFr Bld: 5.8 % — ABNORMAL HIGH (ref 4.8–5.6)
Mean Plasma Glucose: 119.76 mg/dL

## 2020-05-19 MED ORDER — MENTHOL 3 MG MT LOZG: 1.0000 | LOZENGE | OROMUCOSAL | 0 refills | Status: DC | PRN

## 2020-05-19 MED ORDER — BENZONATATE 200 MG PO CAPS: 200.0000 mg | ORAL_CAPSULE | Freq: Three times a day (TID) | ORAL | 0 refills | Status: DC | PRN

## 2020-05-19 MED ORDER — TRAMADOL HCL 50 MG PO TABS
50.0000 mg | ORAL_TABLET | Freq: Four times a day (QID) | ORAL | Status: DC | PRN
  Administered 2020-05-19: 50 mg via ORAL
  Filled 2020-05-19: qty 1

## 2020-05-19 MED ORDER — RISPERIDONE 1 MG PO TBDP: 1.0000 mg | ORAL_TABLET | Freq: Every day | ORAL | 0 refills | Status: DC

## 2020-05-19 MED ORDER — HYDROXYZINE HCL 25 MG PO TABS: 25.0000 mg | ORAL_TABLET | Freq: Three times a day (TID) | ORAL | 0 refills | Status: DC | PRN

## 2020-05-19 MED ORDER — ESCITALOPRAM OXALATE 10 MG PO TABS: 10.0000 mg | ORAL_TABLET | Freq: Every day | ORAL | 0 refills | Status: DC

## 2020-05-19 MED ORDER — TRAZODONE HCL 50 MG PO TABS: 50.0000 mg | ORAL_TABLET | Freq: Every day | ORAL | 0 refills | Status: DC

## 2020-05-19 MED ORDER — RISPERIDONE 0.5 MG PO TBDP: 0.5000 mg | ORAL_TABLET | Freq: Every day | ORAL | 0 refills | Status: DC

## 2020-05-19 MED ORDER — DOXYCYCLINE HYCLATE 100 MG PO TABS: 100.0000 mg | ORAL_TABLET | Freq: Two times a day (BID) | ORAL | 0 refills | Status: DC

## 2020-05-19 NOTE — BHH Group Notes (Signed)
[facility] Group Notes:  (Nursing/MHT/Case Management/Adjunct)  Date:  05/19/2020  Time:  8:57 AM  Type of Therapy:  Psychoeducational Skills  Participation Level:  Active  Participation Quality:  Appropriate  Affect:  Appropriate  Cognitive:  Alert and Appropriate  Insight:  Appropriate  Engagement in Group:  Engaged  Modes of Intervention:  Discussion  Summary of Progress/Problems: Goal group and orientation group. Anna Adkins shared that her goal for the day is to discharge plan. She stated '' i'm feeling better and i've learned how its okay to feel down and get the help, and the people here at [facility] have been so welcoming, they laugh with you and make you feel human. It's been good'' Anna Adkins 05/19/2020, 8:57 AM

## 2020-05-19 NOTE — Progress Notes (Signed)
Recreation Therapy Notes  Date: 5.9.22 Time: 1000 Location: [address] Dayroom   Group Topic: Coping Skills   Goal Area(s) Addresses: Patient will define what a coping skill is. Patient will work with peer to create a list of healthy coping skills beginning with each letter of the alphabet. Patient will successfully identify positive coping skills they can use post d/c.  Patient will acknowledge benefit(s) of using learned coping skills post d/c.   Behavioral Response:  Engaged   Intervention: Group work   Activity: Coping A to Z. Patient asked to identify what a coping skill is and when they use them. Patients with Clinical research associate discussed healthy versus unhealthy coping skills. Next patients were given a blank worksheet titled "Coping Skills A-Z" and asked to pair up with a peer. Partners were instructed to come up with at least one positive coping skill per letter of the alphabet, addressing a specific challenge (ex: stress, anger, anxiety, depression, grief, doubt, isolation, self-harm/suicidal thoughts, substance use). Patients were given 15 minutes to brainstorm with their peer, before ideas were presented to the large group. Patients and LRT debriefed on the importance of coping skill selection based on situation and back-up plans when a skill tried is not effective. At the end of group, patients were given an handout of alphabetized strategies to keep for future reference.  Education: Pharmacologist, Scientist, physiological, Discharge Planning.    Education Outcome: Acknowledges education/Verbalizes understanding/In group clarification offered/Additional education needed   Clinical Observations/Feedback: Pt was bright and worked well with peers.  Pt stated in the activity coming up positive coping skills was a challenge because "we have three different generations trying to come up with coping skills because each has to figure out what each other means because of the age difference".  Pt also felt that  would have been an issue if asked to identify negative coping skills.  Pt expressed in life it self coming up with negative coping skills would be easier because of what people tell you or how you feel about yourself.  Pt went on to explain she felt good coming here because the staff made her feel good and feels the staff really cares about the patients.  Pt continued expressing she had a negative view of what it would be like in a behavior health hospital and she was expecting people to be in straight jackets with tape over their mouths.  LRT explained to pt that was a negative view of behavioral hospitals that comes from watching movies that would subconsciously  plant that image in your mind giving her that negative view which would make her view coming to the hospital as a negative coping skill.  Pt was in agreement with that sentiment.   Caroll Rancher, LRT/CTRS   Caroll Rancher A 05/19/2020 11:19 AM

## 2020-05-19 NOTE — Progress Notes (Signed)
  [facility] Adult Case Management Discharge Plan :  Will you be returning to the same living situation after discharge:  Yes,  personal home At discharge, do you have transportation home?: Yes,  via sister Do you have the ability to pay for your medications: Yes,  medicare  Release of information consent forms completed and in the chart;  Patient's signature needed at discharge.  Patient to Follow up at:  Follow-up Information    [facility] Schedule an appointment as soon as possible for a visit.   Why: Please contact this provider to schedule a hospital follow up appointment.   Following this initial appointment, you will be scheduled for a clinical assessment in order to obtain necessary therapy and medication management services.  Contact information: [address] [place] [address] [phone]               Next level of care provider has access to [facility] Link:no  Safety Planning and Suicide Prevention discussed: Yes,  sister  Have you used any form of tobacco in the last 30 days? (Cigarettes, Smokeless Tobacco, Cigars, and/or Pipes): Yes  Has patient been referred to the Quitline?: Patient refused referral  Patient has been referred for addiction treatment: N/A  Felizardo Hoffmann, LCSWA 05/19/2020, 10:42 AM

## 2020-05-19 NOTE — BHH Suicide Risk Assessment (Signed)
[facility] Discharge Suicide Risk Assessment   Principal Problem: <principal problem not specified> Discharge Diagnoses: Active Problems:   MDD (major depressive disorder), recurrent, severe, with psychosis (HCC)   Total Time spent with patient: 15 minutes  Musculoskeletal: Strength & Muscle Tone: within normal limits Gait & Station: shuffle Patient leans: N/A  Psychiatric Specialty Exam: Review of Systems  Musculoskeletal: Positive for arthralgias, joint swelling and myalgias.  All other systems reviewed and are negative.   Blood pressure 108/67, pulse 85, temperature 97.9 F (36.6 C), temperature source Oral, resp. rate 20, height 5\' 8"  (1.727 m), weight 125 kg, SpO2 (!) 87 %.Body mass index is 41.9 kg/m.  General Appearance: Casual  Eye Contact::  Good  Speech:  Normal Rate409  Volume:  Normal  Mood:  Euthymic  Affect:  Congruent  Thought Process:  Coherent and Descriptions of Associations: Intact  Orientation:  Full (Time, Place, and Person)  Thought Content:  Logical  Suicidal Thoughts:  No  Homicidal Thoughts:  No  Memory:  Immediate;   Good Recent;   Good Remote;   Good  Judgement:  Intact  Insight:  Good  Psychomotor Activity:  Normal  Concentration:  Good  Recall:  Good  Fund of Knowledge:Good  Language: Good  Akathisia:  Negative  Handed:  Right  AIMS (if indicated):     Assets:  Desire for Improvement Housing Resilience Social Support  Sleep:  Number of Hours: 4  Cognition: WNL  ADL's:  Intact   Mental Status Per Nursing Assessment::   On Admission:  NA  Demographic Factors:  Caucasian and Unemployed  Loss Factors: Decline in physical health  Historical Factors: Impulsivity  Risk Reduction Factors:   Living with another person, especially a relative, Positive social support and Positive coping skills or problem solving skills  Continued Clinical Symptoms:  Depression:   Impulsivity  Cognitive Features That Contribute To Risk:  None     Suicide Risk:  Minimal: No identifiable suicidal ideation.  Patients presenting with no risk factors but with morbid ruminations; may be classified as minimal risk based on the severity of the depressive symptoms   Follow-up Information    [facility] Schedule an appointment as soon as possible for a visit.   Why: Please contact this provider to schedule a hospital follow up appointment.   Following this initial appointment, you will be scheduled for a clinical assessment in order to obtain necessary therapy and medication management services.  Contact information: [address] [place] [phone]               Plan Of Care/Follow-up recommendations:  Activity:  ad lib  [phone], MD 05/19/2020, 9:17 AM

## 2020-05-19 NOTE — Progress Notes (Signed)
Pt had bad coughing spells, so pt was given nebulizer Tx per [facility]

## 2020-05-19 NOTE — Tx Team (Signed)
Interdisciplinary Treatment and Diagnostic Plan Update  05/19/2020 Time of Session: 9:40am  SHELISE MARON MRN: [id number]  Principal Diagnosis: <principal problem not specified>  Secondary Diagnoses: Active Problems:   MDD (major depressive disorder), recurrent, severe, with psychosis (HCC)   Current Medications:  Current Facility-Administered Medications  Medication Dose Route Frequency Provider Last Rate Last Admin  . acetaminophen (TYLENOL) tablet 650 mg  650 mg Oral Q6H PRN [name], [name], NP   650 mg at 05/18/20 2207  . albuterol (PROVENTIL) (2.5 MG/3ML) 0.083% nebulizer solution 2.5 mg  2.5 mg Nebulization Q6H PRN Anna Pert, MD   2.5 mg at 05/19/20 0512  . albuterol (VENTOLIN HFA) 108 (90 Base) MCG/ACT inhaler 1-2 puff  1-2 puff Inhalation Q6H PRN Anna Pert, MD   2 puff at 05/18/20 2207  . apixaban (ELIQUIS) tablet 5 mg  5 mg Oral BID Anna Pert, MD   5 mg at 05/19/20 3785  . benzonatate (TESSALON) capsule 200 mg  200 mg Oral TID PRN Anna Pert, MD   200 mg at 05/18/20 2036  . doxycycline (VIBRA-TABS) tablet 100 mg  100 mg Oral Q12H Anna Pert, MD   100 mg at 05/19/20 0807  . escitalopram (LEXAPRO) tablet 10 mg  10 mg Oral Daily Anna Pert, MD   10 mg at 05/19/20 0806  . hydrOXYzine (ATARAX/VISTARIL) tablet 25 mg  25 mg Oral TID PRN Adkins, Anna A, NP   25 mg at 05/18/20 2037  . risperiDONE (RISPERDAL M-TABS) disintegrating tablet 2 mg  2 mg Oral Q8H PRN Anna Pert, MD       And  . LORazepam (ATIVAN) tablet 1 mg  1 mg Oral Q6H PRN Anna Pert, MD       And  . ziprasidone (GEODON) injection 20 mg  20 mg Intramuscular Q6H PRN Anna Pert, MD      . menthol-cetylpyridinium (CEPACOL) lozenge 3 mg  1 lozenge Oral PRN Adkins, Anna A, NP      . risperiDONE (RISPERDAL M-TABS) disintegrating tablet 0.5 mg  0.5 mg Oral Daily Anna Pert, MD   0.5 mg at 05/19/20 0807  . risperiDONE (RISPERDAL M-TABS)  disintegrating tablet 1 mg  1 mg Oral QHS Anna Pert, MD   1 mg at 05/18/20 2036  . traMADol (ULTRAM) tablet 50 mg  50 mg Oral Q6H PRN Anna Pert, MD   50 mg at 05/19/20 1037  . traZODone (DESYREL) tablet 50 mg  50 mg Oral QHS Adkins, Anna A, NP   50 mg at 05/18/20 2036   PTA Medications: Medications Prior to Admission  Medication Sig Dispense Refill Last Dose  . acetaminophen (TYLENOL) 500 MG tablet Take 500 mg by mouth every 6 (six) hours as needed.     . Biotin 1000 MCG tablet Take 1,000 mcg by mouth daily. (Patient not taking: No sig reported)     . ELIQUIS 5 MG TABS tablet Take 5 mg by mouth 2 (two) times daily.     Anna Adkins escitalopram (LEXAPRO) 20 MG tablet Take 20 mg by mouth daily.      . fluticasone (FLONASE) 50 MCG/ACT nasal spray Place 1 spray into both nostrils daily.     Anna Adkins loratadine (CLARITIN) 10 MG tablet Take 10 mg by mouth daily.     . Multiple Vitamin (MULTIVITAMIN WITH MINERALS) TABS tablet Take 1 tablet by mouth daily.     . VENTOLIN HFA 108 (90 Base) MCG/ACT inhaler      .  warfarin (COUMADIN) 4 MG tablet Take 4 mg by mouth daily.       Patient Stressors: Health problems Occupational concerns Traumatic event  Patient Strengths: Ability for insight Active sense of humor Communication skills Motivation for treatment/growth  Treatment Modalities: Medication Management, Group therapy, Case management,  1 to 1 session with clinician, Psychoeducation, Recreational therapy.   Physician Treatment Plan for Primary Diagnosis: <principal problem not specified> Long Term Goal(s): Improvement in symptoms so as ready for discharge Improvement in symptoms so as ready for discharge   Short Term Goals: Ability to identify changes in lifestyle to reduce recurrence of condition will improve Ability to verbalize feelings will improve Ability to disclose and discuss suicidal ideas Ability to demonstrate self-control will improve Ability to identify and develop  effective coping behaviors will improve Ability to maintain clinical measurements within normal limits will improve Compliance with prescribed medications will improve Ability to identify changes in lifestyle to reduce recurrence of condition will improve Ability to verbalize feelings will improve Ability to disclose and discuss suicidal ideas Ability to demonstrate self-control will improve Ability to identify and develop effective coping behaviors will improve Ability to maintain clinical measurements within normal limits will improve Compliance with prescribed medications will improve  Medication Management: Evaluate patient's response, side effects, and tolerance of medication regimen.  Therapeutic Interventions: 1 to 1 sessions, Unit Group sessions and Medication administration.  Evaluation of Outcomes: Adequate for Discharge  Physician Treatment Plan for Secondary Diagnosis: Active Problems:   MDD (major depressive disorder), recurrent, severe, with psychosis (HCC)  Long Term Goal(s): Improvement in symptoms so as ready for discharge Improvement in symptoms so as ready for discharge   Short Term Goals: Ability to identify changes in lifestyle to reduce recurrence of condition will improve Ability to verbalize feelings will improve Ability to disclose and discuss suicidal ideas Ability to demonstrate self-control will improve Ability to identify and develop effective coping behaviors will improve Ability to maintain clinical measurements within normal limits will improve Compliance with prescribed medications will improve Ability to identify changes in lifestyle to reduce recurrence of condition will improve Ability to verbalize feelings will improve Ability to disclose and discuss suicidal ideas Ability to demonstrate self-control will improve Ability to identify and develop effective coping behaviors will improve Ability to maintain clinical measurements within normal limits  will improve Compliance with prescribed medications will improve     Medication Management: Evaluate patient's response, side effects, and tolerance of medication regimen.  Therapeutic Interventions: 1 to 1 sessions, Unit Group sessions and Medication administration.  Evaluation of Outcomes: Adequate for Discharge   RN Treatment Plan for Primary Diagnosis: <principal problem not specified> Long Term Goal(s): Knowledge of disease and therapeutic regimen to maintain health will improve  Short Term Goals: Ability to remain free from injury will improve, Ability to participate in decision making will improve, Ability to verbalize feelings will improve, Ability to disclose and discuss suicidal ideas and Ability to identify and develop effective coping behaviors will improve  Medication Management: RN will administer medications as ordered by provider, will assess and evaluate patient's response and provide education to patient for prescribed medication. RN will report any adverse and/or side effects to prescribing provider.  Therapeutic Interventions: 1 on 1 counseling sessions, Psychoeducation, Medication administration, Evaluate responses to treatment, Monitor vital signs and CBGs as ordered, Perform/monitor CIWA, COWS, AIMS and Fall Risk screenings as ordered, Perform wound care treatments as ordered.  Evaluation of Outcomes: Adequate for Discharge   LCSW Treatment Plan for  Primary Diagnosis: <principal problem not specified> Long Term Goal(s): Safe transition to appropriate next level of care at discharge, Engage patient in therapeutic group addressing interpersonal concerns.  Short Term Goals: Engage patient in aftercare planning with referrals and resources, Increase social support, Increase emotional regulation, Facilitate acceptance of mental health diagnosis and concerns, Identify triggers associated with mental health/substance abuse issues and Increase skills for wellness and  recovery  Therapeutic Interventions: Assess for all discharge needs, 1 to 1 time with Social worker, Explore available resources and support systems, Assess for adequacy in community support network, Educate family and significant other(s) on suicide prevention, Complete Psychosocial Assessment, Interpersonal group therapy.  Evaluation of Outcomes: Adequate for Discharge   Progress in Treatment: Attending groups: Yes. Participating in groups: Yes. Taking medication as prescribed: Yes. Toleration medication: Yes. Family/Significant other contact made: Yes, individual(s) contacted:  Sister  Patient understands diagnosis: Yes. Discussing patient identified problems/goals with staff: Yes. Medical problems stabilized or resolved: Yes. Denies suicidal/homicidal ideation: Yes. Issues/concerns per patient self-inventory: No.   New problem(s) identified: No, Describe:  None  New Short Term/Long Term Goal(s): medication stabilization, elimination of SI thoughts, development of comprehensive mental wellness plan.   Patient Goals: "To better understand my mental health"  Discharge Plan or Barriers: Patient will return home and will follow up at [facility] [place] for therapy and medication management.   Reason for Continuation of Hospitalization: Medication stabilization  Estimated Length of Stay: Adequate for discharge   Attendees: Patient: Anna Adkins 05/19/2020   Physician: Landry Mellow, MD  05/19/2020   Nursing:  05/19/2020   RN Care Manager: 05/19/2020   Social Worker: Melba Coon, LCSWA  05/19/2020   Recreational Therapist:  05/19/2020   Other:  05/19/2020   Other:  05/19/2020   Other: 05/19/2020     Scribe for Treatment Team: Aram Beecham, LCSWA 05/19/2020 2:22 PM

## 2020-05-19 NOTE — Progress Notes (Signed)
Pt discharged to lobby. Pt was stable and appreciative at that time. All papers and prescriptions were given and valuables returned. Verbal understanding expressed. Denies SI/HI and A/VH. Pt given opportunity to express concerns and ask questions.  

## 2020-05-19 NOTE — Progress Notes (Addendum)
   05/19/20 0500  Sleep  Number of Hours 4

## 2020-05-19 NOTE — Discharge Summary (Signed)
Physician Discharge Summary Note  Patient:  Anna Adkins is an 55 y.o., female MRN:  [id number] DOB:  Oct 11, 1965 Patient phone:  [phone] (home)  Patient address:   [address] [place] [address],  Total Time spent with patient: 30 minutes  Date of Admission:  05/16/2020 Date of Discharge: 05/19/2020  Reason for Admission:  (From MD's admission note): Patient is a 55 year old female with a reported negative past psychiatric history who presented to the [facility] emergency department on 05/14/2020 after having brought there by emergency medical services with altered mental status. The patient's mother felt as though the patient had been tangential and talking nonsense. The patient was noted to be guarded, but was able to enter their emergency department. She was evaluated by the emergency room physician and the history at that point was the patient had not been sleeping over the last 4 days prior to admission. She apparently was awake and confused. She was unable to talk to people. She did state that she did not want to change anyone else's "timeline". She believes and time travel. Comprehensive clinical assessment was done on 05/14/2020. During that evaluation the patient appeared alert and oriented x4. She was noted to be calm and cooperative. The patient reported at that time she had not been resting well, and reported that she had gone to [place] to help the patient's aunt. She returned to the [place] area on the date of admission. She described feeling dissociative and admitted that she was on medication for both her depression and anxiety. On examination this a.m. she is unable to recall exactly what those medications are. She denied suicidal or homicidal ideation. On the morning that I evaluated the patient she continued to deny this, but did endorse having auditory and visual hallucinations including having seen a snake in her aunts  home. Psychiatric consultation was obtained on 05/15/2020. She was noted to be confused and making statements that made no sense. She apparently received forced medication in the emergency department. On the evaluation on 5/5 she was sedated, and was unable to clearly wake up. It was reported that she had been prescribed Lexapro previously for her depression. She was transferred to our facility for evaluation and stabilization. She denied any auditory or visual hallucinations since having arrived at our facility. She did keep her head under the blanket during the entire interview this AM. She has a significant history of vascular disease and is on Eliquis for this. She also continues to smoke and has a chronic cough suggestive of chronic bronchitis. Last night on admission she was started on low-dose Risperdal at 0.5 mg p.o. daily and 1 mg p.o. nightly. She denies any history of requiring oxygen at home, sleep apnea or other issues that could have potentially lead to apneic episodes.  Evaluation on the unit: Patient was seen and evaluated. Patient is taking her medications as prescribed without any issues. She was started on low dose Risperdal while in the emergency room, this was continued while at [facility]. Patient denies SI/HI/AVH, paranoia and delusions. Patient is sleeping and eating well. She is attending group therapy and interacting appropriately with staff and peers. She has bronchitis and has been put on doxycycline for this. Her cough has continued and she has been prescribed Tessalon pearls to help with this. She is followed by outpatient providers for her vascular disease and is prescribed Eliquis for this.  Her VS are stable, she is afebrile. Her O2sats are  running a bit low at times, this is likely due to her bronchitis. She is aware to follow up with her PCP if she has any further distress regarding her breathing or her cough.  Patient is stable for discharge home today.    Principal  Problem: <principal problem not specified> Discharge Diagnoses: Active Problems:   MDD (major depressive disorder), recurrent, severe, with psychosis (HCC)   Past Psychiatric History: See H&P  Past Medical History:  Past Medical History:  Diagnosis Date  . Anxiety   . Anxiety   . Arthritis   . Depression   . Depression   . DVT (deep venous thrombosis) (HCC)   . DVT of leg (deep venous thrombosis) (HCC) 2014  . Pulmonary emboli (HCC)   . TIA (transient ischemic attack) 2014    Past Surgical History:  Procedure Laterality Date  . Blood clot removal    . ECTOPIC PREGNANCY SURGERY    . HERNIA REPAIR    . HERNIA REPAIR  2001  . SKIN GRAFT    . TUBAL LIGATION     Family History:  Family History  Problem Relation Age of Onset  . Healthy Mother   . Heart disease Father   . Diabetes Father   . Asthma Father   . Breast cancer Neg Hx    Family Psychiatric  History: See H&P Social History:  Social History   Substance and Sexual Activity  Alcohol Use Yes  . Alcohol/week: 1.0 standard drink  . Types: 1 Glasses of wine per week   Comment: occasional     Social History   Substance and Sexual Activity  Drug Use No    Social History   Socioeconomic History  . Marital status: Single    Spouse name: Not on file  . Number of children: 4  . Years of education: Not on file  . Highest education level: Not on file  Occupational History  . Not on file  Tobacco Use  . Smoking status: Current Every Day Smoker    Packs/day: 1.00    Years: 20.00    Pack years: 20.00  . Smokeless tobacco: Never Used  Vaping Use  . Vaping Use: Never used  Substance and Sexual Activity  . Alcohol use: Yes    Alcohol/week: 1.0 standard drink    Types: 1 Glasses of wine per week    Comment: occasional  . Drug use: No  . Sexual activity: Not on file  Other Topics Concern  . Not on file  Social History Narrative  . Not on file   Social Determinants of Health   Financial Resource  Strain: Not on file  Food Insecurity: Not on file  Transportation Needs: Not on file  Physical Activity: Not on file  Stress: Not on file  Social Connections: Not on file    Hospital Course:  After the above admission evaluation, [name]'s presenting symptoms were noted. She was recommended for mood stabilization treatments. The medication regimen targeting those presenting symptoms were discussed with her & initiated with her consent. Her BAL and UDS on arrival to the ED were negative. She was however medicated, stabilized & discharged on the medications as listed on her discharge medication list below. Besides the mood stabilization treatments, Rhegan was also enrolled & participated in the group counseling sessions being offered & held on this unit. She learned coping skills. She presented no other significant pre-existing medical issues that required treatment. She tolerated his treatment regimen without any adverse effects or reactions  reported.   During the course of her hospitalization, the 15-minute checks were adequate to ensure patient's safety. Tyannah did not display any dangerous, violent or suicidal behavior on the unit.  She interacted with patients & staff appropriately, participated appropriately in the group sessions/therapies. Her medications were addressed & adjusted to meet her needs. She was recommended for outpatient follow-up care & medication management upon discharge to assure continuity of care & mood stability.  At the time of discharge patient is not reporting any acute suicidal/homicidal ideations. She feels more confident about her self-care & in managing his mental health. She currently denies any new issues or concerns. Education and supportive counseling provided throughout her hospital stay & upon discharge.   Today upon her discharge evaluation with the attending psychiatrist, Nastasia shares she is doing well. She denies any other specific concerns. She is sleeping well.  Her appetite is good. She denies other physical complaints. She denies AH/VH, delusional thoughts or paranoia. She does not appear to be responding to any internal stimuli. She feels that her medications have been helpful & is in agreement to continue her current treatment regimen as recommended. She was able to engage in safety planning including plan to return to [facility] or contact emergency services if she feels unable to maintain his/her own safety or the safety of others. Pt had no further questions, comments, or concerns. She left [facility] with all personal belongings in no apparent distress. Transportation per private vehicle.   Physical Findings: AIMS:  , ,  ,  ,    CIWA:    COWS:     Musculoskeletal: Strength & Muscle Tone: within normal limits Gait & Station: normal Patient leans: N/A  Psychiatric Specialty Exam:  Presentation  General Appearance: Casual  Eye Contact:Good  Speech:Normal Rate  Speech Volume:Normal  Handedness:Right  Mood and Affect  Mood:Euthymic  Affect:Congruent  Thought Process  Thought Processes:Coherent  Descriptions of Associations:Intact  Orientation:Full (Time, Place and Person)  Thought Content:Logical  History of Schizophrenia/Schizoaffective disorder:No  Duration of Psychotic Symptoms:Less than six months  Hallucinations:Hallucinations: None  Ideas of Reference:None  Suicidal Thoughts:Suicidal Thoughts: No  Homicidal Thoughts:Homicidal Thoughts: No  Sensorium  Memory:Immediate Good; Recent Good; Remote Good  Judgment:Good  Insight:Good  Executive Functions  Concentration:Good  Attention Span:Good  Recall:Good  Fund of Knowledge:Good  Language:Good  Psychomotor Activity  Psychomotor Activity:No data recorded  Assets  Assets:Desire for Improvement; Resilience  Sleep  Sleep:Sleep: Good Number of Hours of Sleep: 6  Physical Exam: Physical Exam Vitals and nursing note reviewed.  Constitutional:      Appearance:  Normal appearance.  HENT:     Head: Normocephalic.  Pulmonary:     Effort: Pulmonary effort is normal.  Musculoskeletal:        General: Normal range of motion.     Cervical back: Normal range of motion.  Neurological:     General: No focal deficit present.     Mental Status: She is alert and oriented to person, place, and time.  Psychiatric:        Attention and Perception: Attention normal. She does not perceive auditory or visual hallucinations.        Mood and Affect: Mood normal.        Speech: Speech normal.        Behavior: Behavior normal. Behavior is cooperative.        Thought Content: Thought content normal. Thought content is not paranoid or delusional. Thought content does not include homicidal or suicidal ideation.  Thought content does not include homicidal or suicidal plan.        Cognition and Memory: Cognition normal.    Review of Systems  Constitutional: Negative for fever.  HENT: Negative for congestion and sore throat.   Respiratory: Negative for cough and shortness of breath.   Cardiovascular: Negative for chest pain.  Gastrointestinal: Negative.   Genitourinary: Negative.   Musculoskeletal: Negative.   Neurological: Negative.    Blood pressure 108/67, pulse 85, temperature 97.9 F (36.6 C), temperature source Oral, resp. rate 20, height 5\' 8"  (1.727 m), weight 125 kg, SpO2 (!) 87 %. Body mass index is 41.9 kg/m.   Have you used any form of tobacco in the last 30 days? (Cigarettes, Smokeless Tobacco, Cigars, and/or Pipes): Yes  Has this patient used any form of tobacco in the last 30 days? (Cigarettes, Smokeless Tobacco, Cigars, and/or Pipes) Yes, Yes, A prescription for an FDA-approved tobacco cessation medication was offered at discharge and the patient refused  Blood Alcohol level:  Lab Results  Component Value Date   ETH <10 05/14/2020    Metabolic Disorder Labs:  Lab Results  Component Value Date   HGBA1C 5.8 (H) 05/18/2020   MPG 119.76  05/18/2020   No results found for: PROLACTIN Lab Results  Component Value Date   CHOL 173 05/18/2020   TRIG 224 (H) 05/18/2020   HDL 37 (L) 05/18/2020   CHOLHDL 4.7 05/18/2020   VLDL 45 (H) 05/18/2020   LDLCALC 91 05/18/2020    See Psychiatric Specialty Exam and Suicide Risk Assessment completed by Attending Physician prior to discharge.  Discharge destination:  Home  Is patient on multiple antipsychotic therapies at discharge:  No   Has Patient had three or more failed trials of antipsychotic monotherapy by history:  No  Recommended Plan for Multiple Antipsychotic Therapies: NA  Discharge Instructions    Diet - low sodium heart healthy   Complete by: As directed    Increase activity slowly   Complete by: As directed      Allergies as of 05/19/2020      Reactions   Sulfa Antibiotics Anaphylaxis      Medication List    STOP taking these medications   acetaminophen 500 MG tablet Commonly known as: TYLENOL   Biotin 1000 MCG tablet     TAKE these medications     Indication  benzonatate 200 MG capsule Commonly known as: TESSALON Take 1 capsule (200 mg total) by mouth 3 (three) times daily as needed for cough.  Indication: Cough   doxycycline 100 MG tablet Commonly known as: VIBRA-TABS Take 1 tablet (100 mg total) by mouth every 12 (twelve) hours.  Indication: Upper Respiratory Tract Infection   Eliquis 5 MG Tabs tablet Generic drug: apixaban Take 5 mg by mouth 2 (two) times daily.  Indication: Temporary Stroke   escitalopram 10 MG tablet Commonly known as: LEXAPRO Take 1 tablet (10 mg total) by mouth daily. Start taking on: May 20, 2020 What changed:   medication strength  how much to take  Indication: Major Depressive Disorder   fluticasone 50 MCG/ACT nasal spray Commonly known as: FLONASE Place 1 spray into both nostrils daily.  Indication: Nonallergic Rhinitis   hydrOXYzine 25 MG tablet Commonly known as: ATARAX/VISTARIL Take 1 tablet (25 mg  total) by mouth 3 (three) times daily as needed for anxiety.  Indication: Feeling Anxious   loratadine 10 MG tablet Commonly known as: CLARITIN Take 10 mg by mouth daily.  Indication: Hayfever  menthol-cetylpyridinium 3 MG lozenge Commonly known as: CEPACOL Take 1 lozenge (3 mg total) by mouth as needed for sore throat.  Indication: Cough   multivitamin with minerals Tabs tablet Take 1 tablet by mouth daily.  Indication: Nutritional Support   risperiDONE 1 MG disintegrating tablet Commonly known as: RISPERDAL M-TABS Take 1 tablet (1 mg total) by mouth at bedtime.  Indication: Major Depressive Disorder   risperiDONE 0.5 MG disintegrating tablet Commonly known as: RISPERDAL M-TABS Take 1 tablet (0.5 mg total) by mouth daily. Start taking on: May 20, 2020  Indication: Major Depressive Disorder   traZODone 50 MG tablet Commonly known as: DESYREL Take 1 tablet (50 mg total) by mouth at bedtime.  Indication: Trouble Sleeping   Ventolin HFA 108 (90 Base) MCG/ACT inhaler Generic drug: albuterol  Indication: Asthma   warfarin 4 MG tablet Commonly known as: COUMADIN Take 4 mg by mouth daily.  Indication: Atrial Fibrillation, Disease involving a Thrombosis or an Embolism, Temporary Stroke       Follow-up Information    [facility] Schedule an appointment as soon as possible for a visit.   Why: Please contact this provider to schedule a hospital follow up appointment.   Following this initial appointment, you will be scheduled for a clinical assessment in order to obtain necessary therapy and medication management services.  Contact information: [address] [place] [address] [phone]               Follow-up recommendations:  Activity:  as tolerated Diet:  Heart healthy  Comments:  Prescriptions given at discharge.  Patient agreeable to plan.  Given opportunity to ask questions.  Appears to feel comfortable with discharge denies any  current suicidal or homicidal thoughts.   Patient is instructed prior to discharge to: Take all medications as prescribed by her mental healthcare provider. Report any adverse effects and or reactions from the medicines to her outpatient provider promptly. Patient has been instructed & cautioned: To not engage in alcohol and or illegal drug use while on prescription medicines. In the event of worsening symptoms, patient is instructed to call the crisis hotline, 911 and or go to the nearest ED for appropriate evaluation and treatment of symptoms. To follow-up with her primary care provider for your other medical issues, concerns and or health care needs.   Signed: Laveda AbbeLaurie Britton Zyon Grout, NP 05/19/2020, 12:46 PM

## 2020-05-30 ENCOUNTER — Ambulatory Visit
Admission: RE | Admit: 2020-05-30 | Discharge: 2020-05-30 | Disposition: A | Discharge status: Home / Self Care | Payer: Medicare Other | Source: Ambulatory Visit | Attending: Physician Assistant | Admitting: Physician Assistant

## 2020-05-30 ENCOUNTER — Other Ambulatory Visit: Payer: Self-pay

## 2020-05-30 DIAGNOSIS — Z1231 Encounter for screening mammogram for malignant neoplasm of breast: Secondary | ICD-10-CM | POA: Insufficient documentation

## 2021-06-18 IMAGING — CT CT HEAD W/O CM
3 series · 16 of 47 positions shown, 19 images · non-contrast
Comparison: None.

CLINICAL DATA: Altered mental status.

EXAM:
CT HEAD WITHOUT CONTRAST
TECHNIQUE: Contiguous axial images were obtained from the base of the skull
through the vertex without intravenous contrast.

[Series 3: head wo · axial · 0.40mm/px · z∈[-148,-23]mm · 10 of 31 slices shown, 13 images]
[im 3/31  brain]
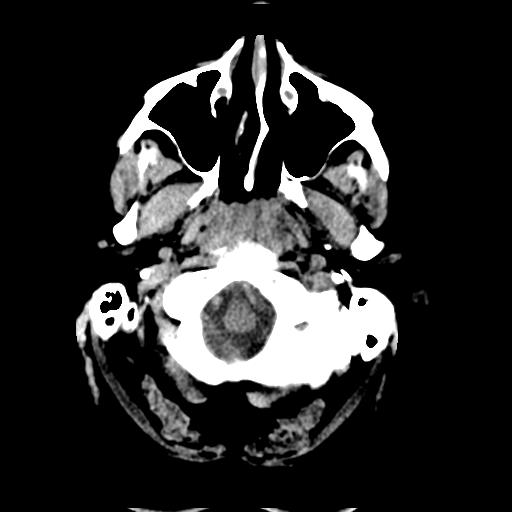
[im 3/31  bone]
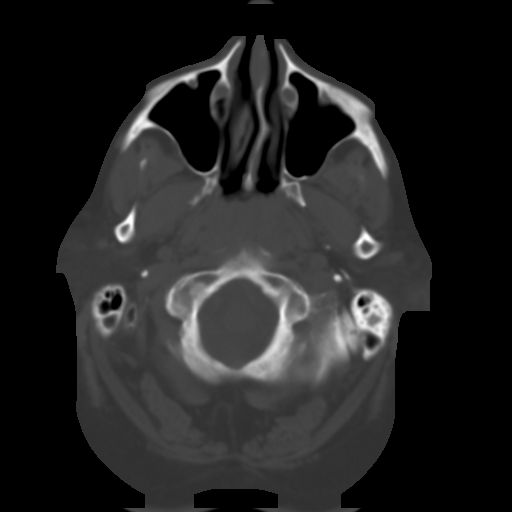
[im 6/31  brain]
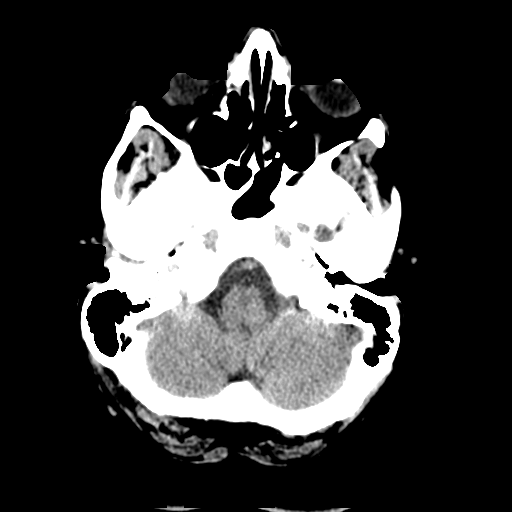
[im 9/31  brain]
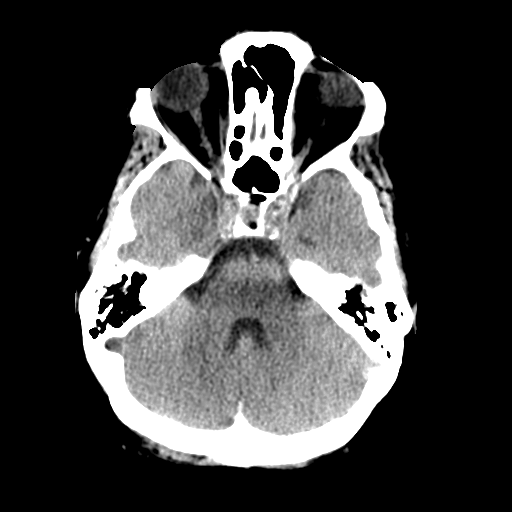
[im 11/31  brain]
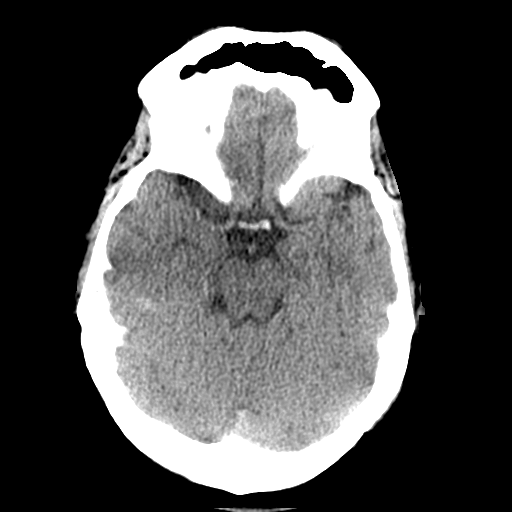
[im 14/31  brain]
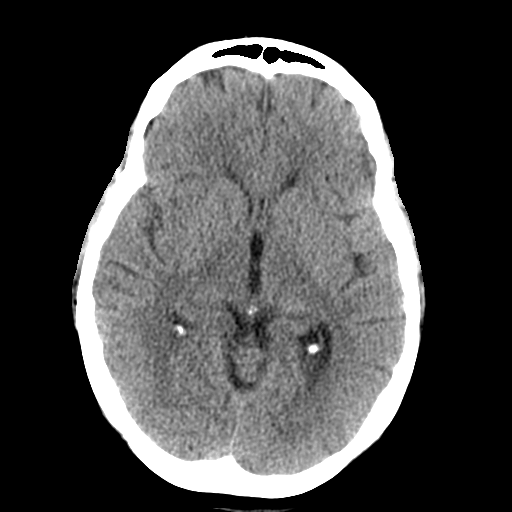
[im 14/31  bone]
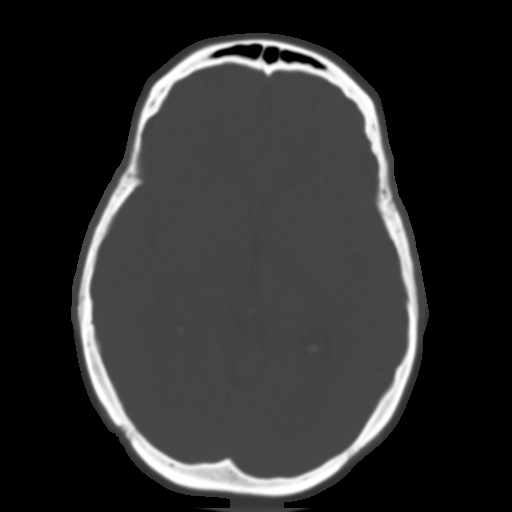
[im 17/31  brain]
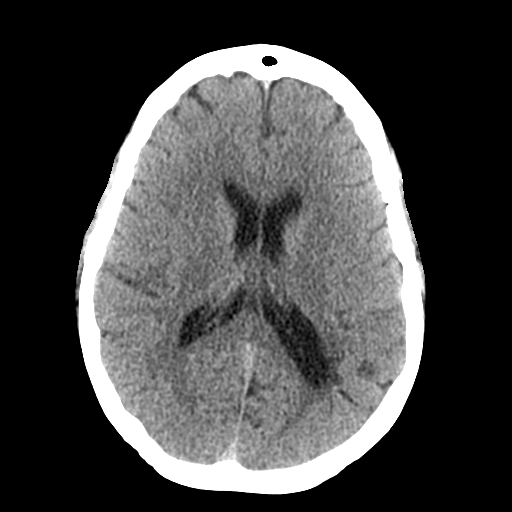
[im 20/31  brain]
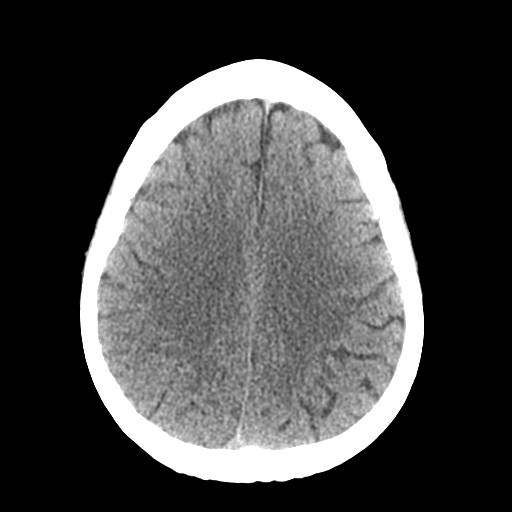
[im 23/31  brain]
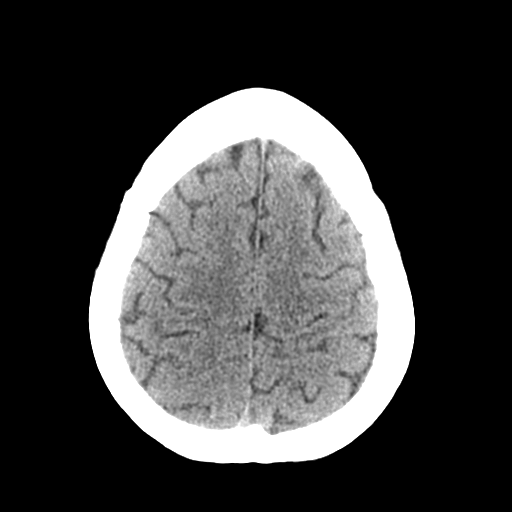
[im 25/31  brain]
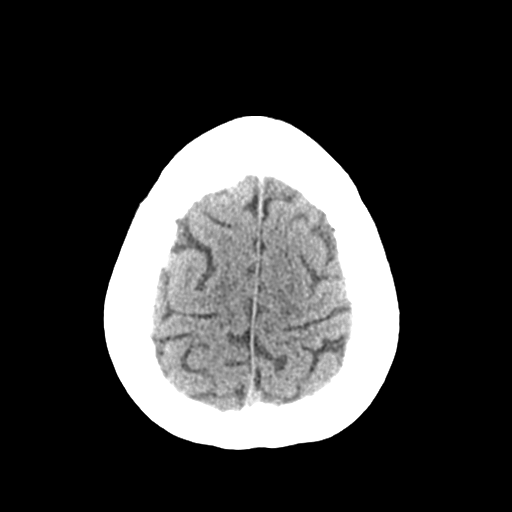
[im 25/31  bone]
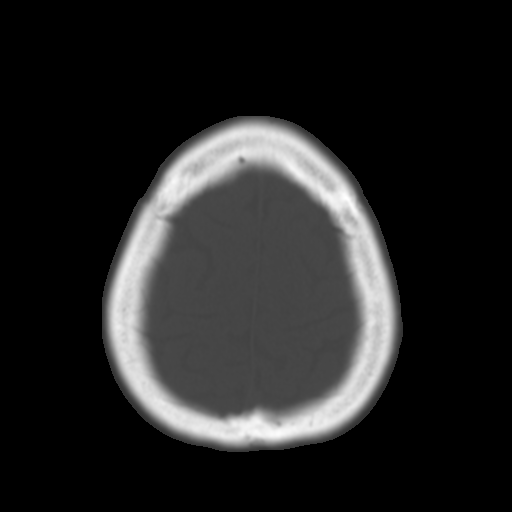
[im 28/31  brain]
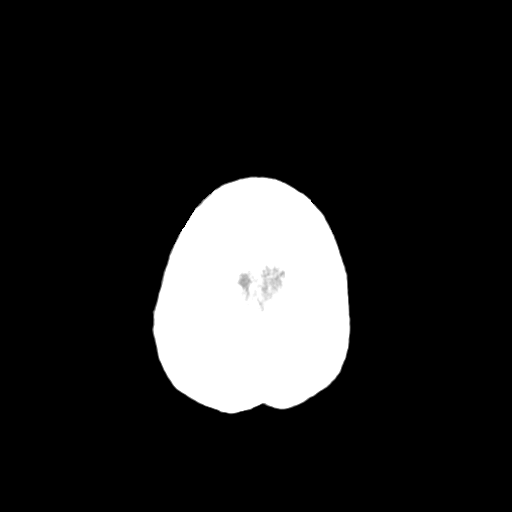

[Series 4: coronal soft tissue · coronal · 0.31mm/px · 3 of 67 slices shown]
[im 23/67  brain]
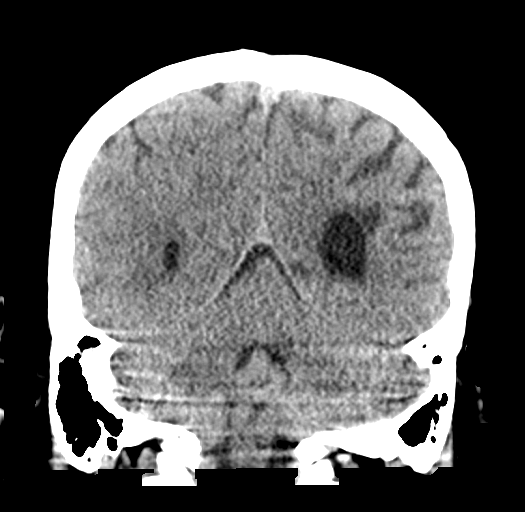
[im 30/67  brain]
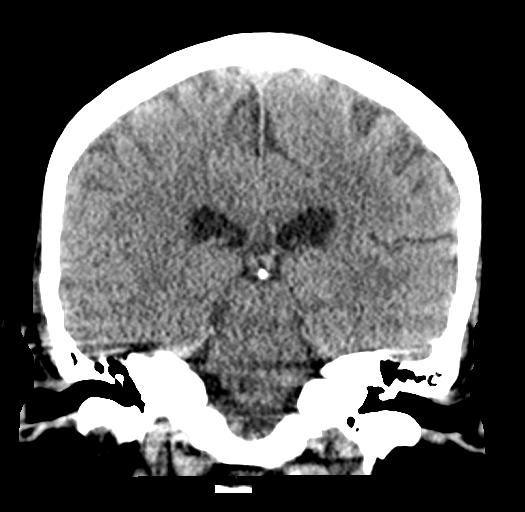
[im 37/67  brain]
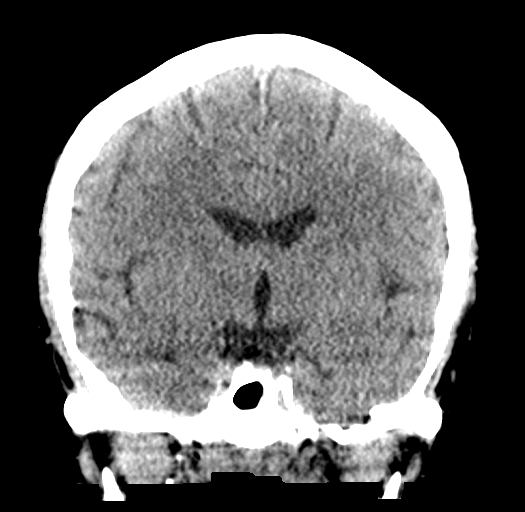

[Series 5: sagittal soft tissue · sagittal · 0.31mm/px · 3 of 56 slices shown]
[im 19/56  brain]
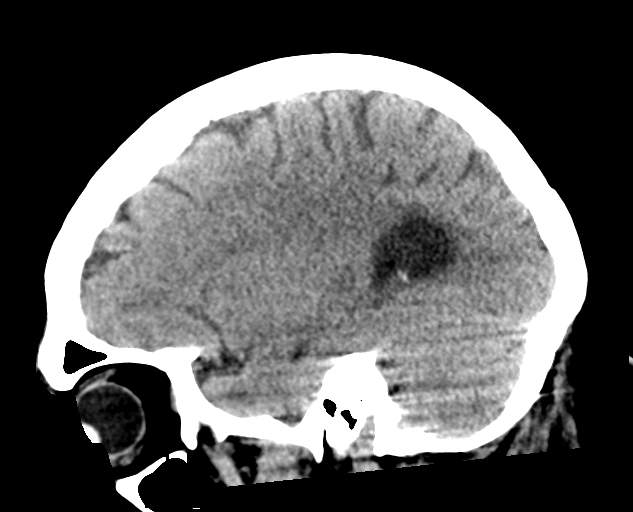
[im 28/56  brain]
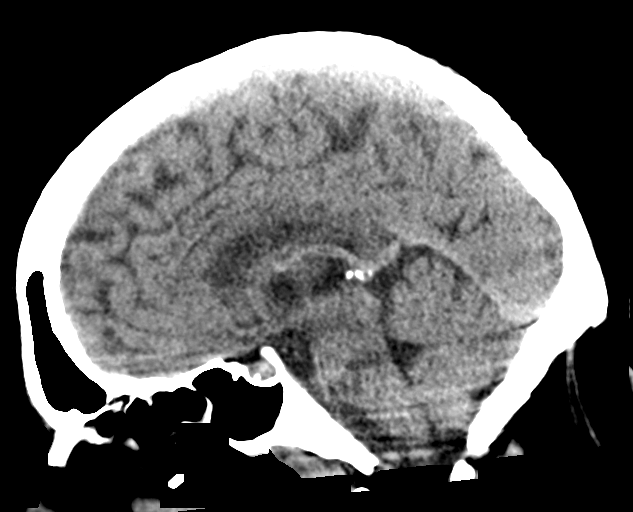
[im 37/56  brain]
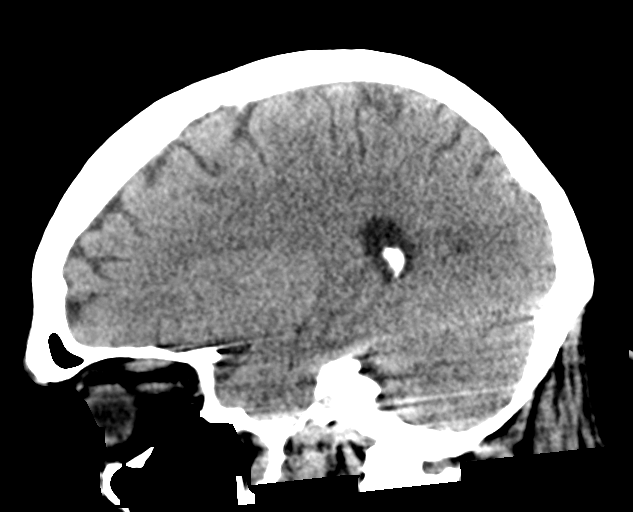

[16 of 47 positions shown; findings below may reference images not displayed]

FINDINGS: Brain: No evidence of acute infarction, hemorrhage, hydrocephalus,
extra-axial collection or mass lesion/mass effect.

Vascular: No hyperdense vessel or unexpected calcification.

Skull: Intact.  No focal lesion.

Sinuses/Orbits: Negative.

Other: None.
IMPRESSION: Normal head CT.

## 2021-06-27 ENCOUNTER — Encounter: Payer: Self-pay | Admitting: Emergency Medicine

## 2021-06-27 ENCOUNTER — Inpatient Hospital Stay
Admission: EM | Admit: 2021-06-27 | Discharge: 2021-07-02 | DRG: 190 | Disposition: A | Discharge status: Home / Self Care | Payer: Medicare Other | Attending: Internal Medicine | Admitting: Internal Medicine

## 2021-06-27 ENCOUNTER — Other Ambulatory Visit: Payer: Self-pay

## 2021-06-27 DIAGNOSIS — Z79899 Other long term (current) drug therapy: Secondary | ICD-10-CM

## 2021-06-27 DIAGNOSIS — Z86718 Personal history of other venous thrombosis and embolism: Secondary | ICD-10-CM

## 2021-06-27 DIAGNOSIS — J441 Chronic obstructive pulmonary disease with (acute) exacerbation: Principal | ICD-10-CM | POA: Diagnosis present

## 2021-06-27 DIAGNOSIS — Z7901 Long term (current) use of anticoagulants: Secondary | ICD-10-CM

## 2021-06-27 DIAGNOSIS — F410 Panic disorder [episodic paroxysmal anxiety] without agoraphobia: Secondary | ICD-10-CM | POA: Diagnosis present

## 2021-06-27 DIAGNOSIS — Z8673 Personal history of transient ischemic attack (TIA), and cerebral infarction without residual deficits: Secondary | ICD-10-CM

## 2021-06-27 DIAGNOSIS — I959 Hypotension, unspecified: Secondary | ICD-10-CM | POA: Clinically undetermined

## 2021-06-27 DIAGNOSIS — J9601 Acute respiratory failure with hypoxia: Secondary | ICD-10-CM | POA: Diagnosis present

## 2021-06-27 DIAGNOSIS — Z833 Family history of diabetes mellitus: Secondary | ICD-10-CM

## 2021-06-27 DIAGNOSIS — Z6834 Body mass index (BMI) 34.0-34.9, adult: Secondary | ICD-10-CM

## 2021-06-27 DIAGNOSIS — E669 Obesity, unspecified: Secondary | ICD-10-CM | POA: Diagnosis present

## 2021-06-27 DIAGNOSIS — Z825 Family history of asthma and other chronic lower respiratory diseases: Secondary | ICD-10-CM

## 2021-06-27 DIAGNOSIS — Z86711 Personal history of pulmonary embolism: Secondary | ICD-10-CM

## 2021-06-27 DIAGNOSIS — Z720 Tobacco use: Secondary | ICD-10-CM | POA: Diagnosis present

## 2021-06-27 DIAGNOSIS — I82409 Acute embolism and thrombosis of unspecified deep veins of unspecified lower extremity: Secondary | ICD-10-CM | POA: Diagnosis present

## 2021-06-27 DIAGNOSIS — R0902 Hypoxemia: Secondary | ICD-10-CM

## 2021-06-27 DIAGNOSIS — F333 Major depressive disorder, recurrent, severe with psychotic symptoms: Secondary | ICD-10-CM | POA: Diagnosis not present

## 2021-06-27 DIAGNOSIS — F17210 Nicotine dependence, cigarettes, uncomplicated: Secondary | ICD-10-CM | POA: Diagnosis present

## 2021-06-27 DIAGNOSIS — Z8249 Family history of ischemic heart disease and other diseases of the circulatory system: Secondary | ICD-10-CM

## 2021-06-27 DIAGNOSIS — I2699 Other pulmonary embolism without acute cor pulmonale: Secondary | ICD-10-CM | POA: Diagnosis present

## 2021-06-27 DIAGNOSIS — Z20822 Contact with and (suspected) exposure to covid-19: Secondary | ICD-10-CM | POA: Diagnosis present

## 2021-06-27 DIAGNOSIS — M1990 Unspecified osteoarthritis, unspecified site: Secondary | ICD-10-CM | POA: Diagnosis present

## 2021-06-27 LAB — CBC
HCT: 47 % — ABNORMAL HIGH (ref 36.0–46.0)
Hemoglobin: 15 g/dL (ref 12.0–15.0)
MCH: 29.2 pg (ref 26.0–34.0)
MCHC: 31.9 g/dL (ref 30.0–36.0)
MCV: 91.4 fL (ref 80.0–100.0)
Platelets: 272 10*3/uL (ref 150–400)
RBC: 5.14 MIL/uL — ABNORMAL HIGH (ref 3.87–5.11)
RDW: 13.5 % (ref 11.5–15.5)
WBC: 8.6 10*3/uL (ref 4.0–10.5)
nRBC: 0 % (ref 0.0–0.2)

## 2021-06-27 LAB — URINE DRUG SCREEN, QUALITATIVE (ARMC ONLY)
Amphetamines, Ur Screen: NOT DETECTED
Barbiturates, Ur Screen: NOT DETECTED
Benzodiazepine, Ur Scrn: NOT DETECTED
Cannabinoid 50 Ng, Ur ~~LOC~~: NOT DETECTED
Cocaine Metabolite,Ur ~~LOC~~: NOT DETECTED
MDMA (Ecstasy)Ur Screen: NOT DETECTED
Methadone Scn, Ur: NOT DETECTED
Opiate, Ur Screen: NOT DETECTED
Phencyclidine (PCP) Ur S: NOT DETECTED
Tricyclic, Ur Screen: NOT DETECTED

## 2021-06-27 LAB — COMPREHENSIVE METABOLIC PANEL
ALT: 15 U/L (ref 0–44)
AST: 18 U/L (ref 15–41)
Albumin: 4.3 g/dL (ref 3.5–5.0)
Alkaline Phosphatase: 60 U/L (ref 38–126)
Anion gap: 10 (ref 5–15)
BUN: 12 mg/dL (ref 6–20)
CO2: 26 mmol/L (ref 22–32)
Calcium: 9.6 mg/dL (ref 8.9–10.3)
Chloride: 105 mmol/L (ref 98–111)
Creatinine, Ser: 0.84 mg/dL (ref 0.44–1.00)
GFR, Estimated: >60 mL/min (ref >60)
Glucose, Bld: 120 mg/dL — ABNORMAL HIGH (ref 70–99)
Potassium: 3.8 mmol/L (ref 3.5–5.1)
Sodium: 141 mmol/L (ref 135–145)
Total Bilirubin: 0.8 mg/dL (ref 0.3–1.2)
Total Protein: 8.4 g/dL — ABNORMAL HIGH (ref 6.5–8.1)

## 2021-06-27 LAB — ACETAMINOPHEN LEVEL: Acetaminophen (Tylenol), Serum: <10 ug/mL — ABNORMAL LOW (ref 10–30)

## 2021-06-27 LAB — ETHANOL: Alcohol, Ethyl (B): <10 mg/dL (ref <10)

## 2021-06-27 LAB — SALICYLATE LEVEL: Salicylate Lvl: <7 mg/dL — ABNORMAL LOW (ref 7.0–30.0)

## 2021-06-27 NOTE — ED Triage Notes (Addendum)
1 black hair tie, 1 black shirt, 1 sports bra, 1 pair pajama pants, 1 pair slippers, 1 pair black socks, 1 pair underwear.   Pt dressed out by this RN and Misty Stanley, EDT.    Pt's ring given to her daughter, Brandy Zuba, not stored with patient belongings.   Pt also requesting her belongings go home with her daughter Laynee Lockamy.

## 2021-06-27 NOTE — ED Notes (Signed)
Patient reports feeling overwhelmed with things at this time.  Reports easily irritated with family and states she wants to get some help.

## 2021-06-27 NOTE — ED Triage Notes (Signed)
Pt to ED via POV, states approx 1 yr ago had a panic attack at her aunt's house, then later that day had a breakdown.   Pt's family member reports pt is having "mental health issues". States hx of MDD, and that patient reports she has been taking her medications but is unsure.   Pt states she is here for voluntary committment. Pt denies SI/HI at this time. Pt with noted rambling speech. Pt states she has not slept and feels "overwhlemed with some thing in my life".   Pt's family member report swelling to patient's R leg and swelling.

## 2021-06-28 ENCOUNTER — Emergency Department: Payer: Medicare Other

## 2021-06-28 DIAGNOSIS — F333 Major depressive disorder, recurrent, severe with psychotic symptoms: Secondary | ICD-10-CM | POA: Diagnosis not present

## 2021-06-28 LAB — URINALYSIS, COMPLETE (UACMP) WITH MICROSCOPIC
Bilirubin Urine: NEGATIVE
Glucose, UA: NEGATIVE mg/dL
Hgb urine dipstick: NEGATIVE
Ketones, ur: 5 mg/dL — AB
Leukocytes,Ua: NEGATIVE
Nitrite: NEGATIVE
Protein, ur: 100 mg/dL — AB
Specific Gravity, Urine: 1.021 (ref 1.005–1.030)
pH: 5 (ref 5.0–8.0)

## 2021-06-28 LAB — SARS CORONAVIRUS 2 BY RT PCR: SARS Coronavirus 2 by RT PCR: NEGATIVE

## 2021-06-28 LAB — RESP PANEL BY RT-PCR (FLU A&B, COVID) ARPGX2
Influenza A by PCR: NEGATIVE
Influenza B by PCR: NEGATIVE
SARS Coronavirus 2 by RT PCR: NEGATIVE

## 2021-06-28 LAB — TSH: TSH: 6.221 u[IU]/mL — ABNORMAL HIGH (ref 0.350–4.500)

## 2021-06-28 MED ORDER — APIXABAN 5 MG PO TABS
5.0000 mg | ORAL_TABLET | Freq: Two times a day (BID) | ORAL | Status: DC
  Administered 2021-06-28 – 2021-07-02 (×8): 5 mg via ORAL
  Filled 2021-06-28 (×8): qty 1

## 2021-06-28 MED ORDER — DIPHENHYDRAMINE HCL 50 MG/ML IJ SOLN
INTRAMUSCULAR | Status: AC
  Administered 2021-06-28: 50 mg via INTRAVENOUS
  Filled 2021-06-28: qty 1

## 2021-06-28 MED ORDER — IPRATROPIUM-ALBUTEROL 0.5-2.5 (3) MG/3ML IN SOLN
3.0000 mL | Freq: Once | RESPIRATORY_TRACT | Status: AC
  Administered 2021-06-28: 3 mL via RESPIRATORY_TRACT
  Filled 2021-06-28 (×2): qty 3

## 2021-06-28 MED ORDER — ESCITALOPRAM OXALATE 10 MG PO TABS
20.0000 mg | ORAL_TABLET | Freq: Every day | ORAL | Status: DC
  Filled 2021-06-28: qty 2

## 2021-06-28 MED ORDER — RISPERIDONE 1 MG PO TBDP: 0.5000 mg | ORAL_TABLET | Freq: Every day | ORAL | Status: DC

## 2021-06-28 MED ORDER — ALBUTEROL SULFATE HFA 108 (90 BASE) MCG/ACT IN AERS
2.0000 | INHALATION_SPRAY | RESPIRATORY_TRACT | Status: DC | PRN
  Administered 2021-06-28 – 2021-06-29 (×2): 2 via RESPIRATORY_TRACT
  Filled 2021-06-28 (×2): qty 6.7

## 2021-06-28 MED ORDER — ADULT MULTIVITAMIN W/MINERALS CH
1.0000 | ORAL_TABLET | Freq: Every day | ORAL | Status: DC
  Administered 2021-06-29 – 2021-07-02 (×4): 1 via ORAL
  Filled 2021-06-28 (×4): qty 1

## 2021-06-28 MED ORDER — ZIPRASIDONE MESYLATE 20 MG IM SOLR
20.0000 mg | Freq: Once | INTRAMUSCULAR | Status: AC
  Administered 2021-06-28: 20 mg via INTRAMUSCULAR
  Filled 2021-06-28: qty 20

## 2021-06-28 MED ORDER — ALBUTEROL SULFATE HFA 108 (90 BASE) MCG/ACT IN AERS: 1.0000 | INHALATION_SPRAY | RESPIRATORY_TRACT | Status: DC | PRN

## 2021-06-28 MED ORDER — LORATADINE 10 MG PO TABS
10.0000 mg | ORAL_TABLET | Freq: Every day | ORAL | Status: DC
  Administered 2021-06-29 – 2021-07-02 (×4): 10 mg via ORAL
  Filled 2021-06-28 (×4): qty 1

## 2021-06-28 MED ORDER — OLANZAPINE 10 MG IM SOLR: 10.0000 mg | Freq: Once | INTRAMUSCULAR | Status: AC

## 2021-06-28 MED ORDER — DIPHENHYDRAMINE HCL 50 MG/ML IJ SOLN
50.0000 mg | Freq: Once | INTRAMUSCULAR | Status: AC
  Administered 2021-06-28: 50 mg via INTRAMUSCULAR
  Filled 2021-06-28: qty 1

## 2021-06-28 MED ORDER — DIPHENHYDRAMINE HCL 50 MG/ML IJ SOLN: 50.0000 mg | Freq: Once | INTRAMUSCULAR | Status: DC

## 2021-06-28 MED ORDER — DIPHENHYDRAMINE HCL 50 MG/ML IJ SOLN: 50.0000 mg | Freq: Once | INTRAMUSCULAR | Status: AC

## 2021-06-28 MED ORDER — OLANZAPINE 10 MG IM SOLR
INTRAMUSCULAR | Status: AC
  Administered 2021-06-28: 10 mg via INTRAMUSCULAR
  Filled 2021-06-28: qty 10

## 2021-06-28 MED ORDER — ASENAPINE MALEATE 5 MG SL SUBL
10.0000 mg | SUBLINGUAL_TABLET | Freq: Once | SUBLINGUAL | Status: AC
  Administered 2021-06-28: 10 mg via SUBLINGUAL
  Filled 2021-06-28: qty 2

## 2021-06-28 MED ORDER — TRAZODONE HCL 50 MG PO TABS
50.0000 mg | ORAL_TABLET | Freq: Every day | ORAL | Status: DC
  Administered 2021-06-28 – 2021-07-01 (×4): 50 mg via ORAL
  Filled 2021-06-28 (×4): qty 1

## 2021-06-28 MED ORDER — IOHEXOL 350 MG/ML SOLN
75.0000 mL | Freq: Once | INTRAVENOUS | Status: AC | PRN
  Administered 2021-06-28: 75 mL via INTRAVENOUS

## 2021-06-28 MED ORDER — RISPERIDONE 1 MG PO TBDP: 1.0000 mg | ORAL_TABLET | Freq: Every day | ORAL | Status: DC

## 2021-06-28 MED ORDER — LORAZEPAM 2 MG/ML IJ SOLN
2.0000 mg | Freq: Once | INTRAMUSCULAR | Status: AC
  Administered 2021-06-28: 2 mg via INTRAMUSCULAR
  Filled 2021-06-28: qty 1

## 2021-06-28 MED ORDER — RISPERIDONE 0.5 MG PO TABS
0.5000 mg | ORAL_TABLET | Freq: Two times a day (BID) | ORAL | Status: DC
  Administered 2021-06-29 – 2021-06-30 (×3): 0.5 mg via ORAL
  Filled 2021-06-28 (×3): qty 1

## 2021-06-28 MED ORDER — RISPERIDONE 1 MG PO TABS
0.5000 mg | ORAL_TABLET | Freq: Two times a day (BID) | ORAL | Status: DC
Start: 2021-06-28 — End: 2021-06-28

## 2021-06-28 NOTE — ED Notes (Addendum)
Pt's daughter, Drena Ham [phone], called for an update.  Staff explained to daughter that once patient woke from a nap that we had to obtain verbal permission to share information then we would call back

## 2021-06-28 NOTE — ED Notes (Signed)
Pt sitting quietly in room.  Hospital meal provided, pt tolerated w/o complaints.  Waste discarded appropriately.

## 2021-06-28 NOTE — ED Notes (Signed)
Report to include Situation, Background, Assessment, and Recommendations received from Katie RN. Patient alert and oriented, warm and dry, in no acute distress. Patient denies SI, HI, AVH and pain. Patient made aware of Q15 minute rounds and security cameras for their safety. Patient instructed to come to me with needs or concerns.  

## 2021-06-28 NOTE — ED Notes (Signed)
Meal provided pt continues to sleep

## 2021-06-28 NOTE — ED Notes (Signed)
VOL/pending psych inpatient admission when medically cleared. ?

## 2021-06-28 NOTE — ED Notes (Signed)
TTS and psych np speaking with patient.

## 2021-06-28 NOTE — Consult Note (Signed)
[facility]   Reason for Consult:  Psychiatric Evaluation Referring Physician:  Dr. Don PerkingVeronese Patient Identification: Anna Adkins MRN:  [id number] Principal Diagnosis: MDD (major depressive disorder), recurrent, severe, with psychosis (HCC) Diagnosis:  Principal Problem:   MDD (major depressive disorder), recurrent, severe, with psychosis (HCC)   Total Time spent with patient: 45 minutes  Subjective:   "Im feeling overwhelmed"  HPI:  Psych Assessment  Anna Capriceeresa G Babineau, 56 y.o., female patient seen  by TTS and this provider; chart reviewed and consulted with Dr. Adriana ReamsVeroness on 06/28/21.  On evaluation Anna Capriceeresa G Marro reports that lately she has been feeling really "overwhelmed".  She says she can feel when she's having "mental problems", as she puts it.  She decribes her mental problems as anxiety, stress and inability to sleep.   During evaluation Anna Capriceeresa G Mcever is sitting on her bed; She is pleasant and talkative. She is alert/oriented x 4; full ranged/cooperative; and mood congruent with affect.  Patient is speaking in a clear tone at moderate volume, and normal pace; with fair eye contact.   While speaking to this provider and TTS, it is apparent that she is having difficulty forming her ideas and getting her words out.  She begins to veer off topic and get frustrated.  She says to this provider and TTS that we are not hearing what she is really trying to say.  Her thought process is at times coherent and irrelevant; There is no indication that she is currently responding to internal/external stimuli, but the she does appear delusional at times and endorses AVH.   Patient denies suicidal/self-harm/ and homicidal ideation.    Per TTS, Pt denies SI/HI at this time. Pt with noted rambling speech. Pt states she has not slept and feels "overwhlemed with some thing in my life". During assessment patient appears alert and oriented x4, calm and cooperative. Patient often rambles and  has disorganized thinking, when asked a question patient will often go on tangents that are irrelevant to the question. Patient is able to report that she needs help, when asked if she is taking her medications she reports "yes" then goes off in a daze and reports that she can't recall the medications she takes. Patient reports "I was here on mother's day in that room" and then goes off with her disorganized thinking discussing the swelling in her legs and ankles. When asked if patient is sleeping she reports "no" but then reports "I sleep all day when I'm overwhelmed." When asked if patient is experiencing AH she reports "yes, I'm a [place] and listen to music." When asked if patient is experiencing VH she reports "yes, I'm scared of snakes." Towards the end of the assessment patient yelled out "Jesus!" And lifted her hands up towards the ceiling. Patient is able to deny SI/HI.   Past Psychiatric History: MDD wit psychosis  Risk to Self:   Risk to Others:   Prior Inpatient Therapy:   Prior Outpatient Therapy:    Past Medical History:  Past Medical History:  Diagnosis Date   Anxiety    Anxiety    Arthritis    Depression    Depression    DVT (deep venous thrombosis) (HCC)    DVT of leg (deep venous thrombosis) (HCC) 2014   Pulmonary emboli (HCC)    TIA (transient ischemic attack) 2014    Past Surgical History:  Procedure Laterality Date   Blood clot removal     ECTOPIC PREGNANCY SURGERY  HERNIA REPAIR     HERNIA REPAIR  2001   SKIN GRAFT     TUBAL LIGATION     Family History:  Family History  Problem Relation Age of Onset   Healthy Mother    Heart disease Father    Diabetes Father    Asthma Father    Breast cancer Neg Hx    Family Psychiatric  History: unknown Social History:  Social History   Substance and Sexual Activity  Alcohol Use Yes   Alcohol/week: 1.0 standard drink of alcohol   Types: 1 Glasses of wine per week   Comment: occasional     Social History    Substance and Sexual Activity  Drug Use No    Social History   Socioeconomic History   Marital status: Single    Spouse name: Not on file   Number of children: 4   Years of education: Not on file   Highest education level: Not on file  Occupational History   Not on file  Tobacco Use   Smoking status: Every Day    Packs/day: 1.00    Years: 20.00    Total pack years: 20.00    Types: Cigarettes   Smokeless tobacco: Never  Vaping Use   Vaping Use: Never used  Substance and Sexual Activity   Alcohol use: Yes    Alcohol/week: 1.0 standard drink of alcohol    Types: 1 Glasses of wine per week    Comment: occasional   Drug use: No   Sexual activity: Not on file  Other Topics Concern   Not on file  Social History Narrative   Not on file   Social Determinants of Health   Financial Resource Strain: Not on file  Food Insecurity: Not on file  Transportation Needs: Not on file  Physical Activity: Not on file  Stress: Not on file  Social Connections: Not on file   Additional Social History:    Allergies:   Allergies  Allergen Reactions   Sulfa Antibiotics Anaphylaxis    Labs:  Results for orders placed or performed during the hospital encounter of 06/27/21 (from the past 48 hour(s))  Comprehensive metabolic panel     Status: Abnormal   Collection Time: 06/27/21 10:21 PM  Result Value Ref Range   Sodium 141 135 - 145 mmol/L   Potassium 3.8 3.5 - 5.1 mmol/L   Chloride 105 98 - 111 mmol/L   CO2 26 22 - 32 mmol/L   Glucose, Bld 120 (H) 70 - 99 mg/dL    Comment: Glucose reference range applies only to samples taken after fasting for at least 8 hours.   BUN 12 6 - 20 mg/dL   Creatinine, Ser 0.10 0.44 - 1.00 mg/dL   Calcium 9.6 8.9 - 93.2 mg/dL   Total Protein 8.4 (H) 6.5 - 8.1 g/dL   Albumin 4.3 3.5 - 5.0 g/dL   AST 18 15 - 41 U/L   ALT 15 0 - 44 U/L   Alkaline Phosphatase 60 38 - 126 U/L   Total Bilirubin 0.8 0.3 - 1.2 mg/dL   GFR, Estimated >35 >57 mL/min     Comment: (NOTE) Calculated using the CKD-EPI Creatinine Equation (2021)    Anion gap 10 5 - 15    Comment: Performed at [facility], [address] [place], [place] [address]  Ethanol     Status: None   Collection Time: 06/27/21 10:21 PM  Result Value Ref Range   Alcohol, Ethyl (B) <10 <  10 mg/dL    Comment: (NOTE) Lowest detectable limit for serum alcohol is 10 mg/dL.  For medical purposes only. Performed at [facility], [address] [place], [place] [address]   Salicylate level     Status: Abnormal   Collection Time: 06/27/21 10:21 PM  Result Value Ref Range   Salicylate Lvl <7.0 (L) 7.0 - 30.0 mg/dL    Comment: Performed at [facility], [address] [place], [place] [address]  Acetaminophen level     Status: Abnormal   Collection Time: 06/27/21 10:21 PM  Result Value Ref Range   Acetaminophen (Tylenol), Serum <10 (L) 10 - 30 ug/mL    Comment: (NOTE) Therapeutic concentrations vary significantly. A range of 10-30 ug/mL  may be an effective concentration for many patients. However, some  are best treated at concentrations outside of this range. Acetaminophen concentrations >150 ug/mL at 4 hours after ingestion  and >50 ug/mL at 12 hours after ingestion are often associated with  toxic reactions.  Performed at [facility], [address] [place], [place] [address]   cbc     Status: Abnormal   Collection Time: 06/27/21 10:21 PM  Result Value Ref Range   WBC 8.6 4.0 - 10.5 K/uL   RBC 5.14 (H) 3.87 - 5.11 MIL/uL   Hemoglobin 15.0 12.0 - 15.0 g/dL   HCT 67.6 (H) 19.5 - 09.3 %   MCV 91.4 80.0 - 100.0 fL   MCH 29.2 26.0 - 34.0 pg   MCHC 31.9 30.0 - 36.0 g/dL   RDW 26.7 12.4 - 58.0 %   Platelets 272 150 - 400 K/uL   nRBC 0.0 0.0 - 0.2 %    Comment: Performed at [facility], [address] [place], [place] [address]  Urine Drug Screen, Qualitative     Status: None   Collection Time: 06/27/21 10:22 PM  Result  Value Ref Range   Tricyclic, Ur Screen NONE DETECTED NONE DETECTED   Amphetamines, Ur Screen NONE DETECTED NONE DETECTED   MDMA (Ecstasy)Ur Screen NONE DETECTED NONE DETECTED   Cocaine Metabolite,Ur [place] NONE DETECTED NONE DETECTED   Opiate, Ur Screen NONE DETECTED NONE DETECTED   Phencyclidine (PCP) Ur S NONE DETECTED NONE DETECTED   Cannabinoid 50 Ng, Ur [place] NONE DETECTED NONE DETECTED   Barbiturates, Ur Screen NONE DETECTED NONE DETECTED   Benzodiazepine, Ur Scrn NONE DETECTED NONE DETECTED   Methadone Scn, Ur NONE DETECTED NONE DETECTED    Comment: (NOTE) Tricyclics + metabolites, urine    Cutoff 1000 ng/mL Amphetamines + metabolites, urine  Cutoff 1000 ng/mL MDMA (Ecstasy), urine              Cutoff 500 ng/mL Cocaine Metabolite, urine          Cutoff 300 ng/mL Opiate + metabolites, urine        Cutoff 300 ng/mL Phencyclidine (PCP), urine         Cutoff 25 ng/mL Cannabinoid, urine                 Cutoff 50 ng/mL Barbiturates + metabolites, urine  Cutoff 200 ng/mL Benzodiazepine, urine              Cutoff 200 ng/mL Methadone, urine                   Cutoff 300 ng/mL  The urine drug screen provides only a preliminary, unconfirmed analytical test result and should not be used for non-medical purposes. Clinical consideration  and professional judgment should be applied to any positive drug screen result due to possible interfering substances. A more specific alternate chemical method must be used in order to obtain a confirmed analytical result. Gas chromatography / mass spectrometry (GC/MS) is the preferred confirm atory method. Performed at [facility] [address] [place], [place] [address]     No current facility-administered medications for this encounter.   Current Outpatient Medications  Medication Sig Dispense Refill   ELIQUIS 5 MG TABS tablet Take 5 mg by mouth 2 (two) times daily.     escitalopram (LEXAPRO) 20 MG tablet Take 20 mg by mouth daily.      loratadine (CLARITIN) 10 MG tablet Take 10 mg by mouth daily.     Multiple Vitamin (MULTIVITAMIN WITH MINERALS) TABS tablet Take 1 tablet by mouth daily.     VENTOLIN HFA 108 (90 Base) MCG/ACT inhaler Inhale 1-2 puffs into the lungs every 4 (four) hours as needed.     risperiDONE (RISPERDAL M-TABS) 0.5 MG disintegrating tablet Take 1 tablet (0.5 mg total) by mouth daily. (Patient not taking: Reported on 06/27/2021) 30 tablet 0   risperiDONE (RISPERDAL M-TABS) 1 MG disintegrating tablet Take 1 tablet (1 mg total) by mouth at bedtime. (Patient not taking: Reported on 06/27/2021) 30 tablet 0   traZODone (DESYREL) 50 MG tablet Take 1 tablet (50 mg total) by mouth at bedtime. (Patient not taking: Reported on 06/27/2021) 30 tablet 0    Musculoskeletal: Strength & Muscle Tone: within normal limits Gait & Station: normal Patient leans: N/A            Psychiatric Specialty Exam:  Presentation  General Appearance: Bizarre Eye Contact:Fair Speech:Normal Rate Speech Volume:Normal Handedness:Right  Mood and Affect  Mood:Anxious; Depressed Affect:Inappropriate; Full Range  Thought Process  Thought Processes:Disorganized Descriptions of Associations:Tangential  Orientation:Full (Time, Place and Person)  Thought Content:Illogical; Tangential  History of Schizophrenia/Schizoaffective disorder:No  Duration of Psychotic Symptoms:Greater than six months  Hallucinations:Hallucinations: Auditory; Visual  Ideas of Reference:No data recorded Suicidal Thoughts:Suicidal Thoughts: No  Homicidal Thoughts:Homicidal Thoughts: No   Sensorium  Memory:Immediate Fair Judgment:Impaired Insight:Lacking  Executive Functions  Concentration:Poor Attention Span:Poor Recall:Poor Fund of Knowledge:Poor Language:Poor  Psychomotor Activity  Psychomotor Activity:No data recorded  Assets  Assets:Desire for Improvement; Talents/Skills; Housing; Social Support  Sleep  Sleep:Sleep:  Poor  Physical Exam: Physical Exam Vitals and nursing note reviewed.  HENT:     Head: Normocephalic and atraumatic.     Nose: Nose normal.     Mouth/Throat:     Mouth: Mucous membranes are dry.  Eyes:     Pupils: Pupils are equal, round, and reactive to light.  Pulmonary:     Effort: Pulmonary effort is normal.  Musculoskeletal:        General: Normal range of motion.     Cervical back: Normal range of motion.  Skin:    General: Skin is warm and dry.  Neurological:     Mental Status: She is alert and oriented to person, place, and time.  Psychiatric:        Attention and Perception: Attention normal.        Mood and Affect: Mood is elated. Affect is inappropriate.        Speech: Speech is tangential.        Behavior: Behavior is cooperative.        Thought Content: Thought content is delusional.        Cognition and Memory: Cognition is impaired. Memory is impaired.  Judgment: Judgment is inappropriate.    Review of Systems  Psychiatric/Behavioral:  Positive for depression and hallucinations. The patient is nervous/anxious.   All other systems reviewed and are negative.  Blood pressure (!) 140/118, pulse (!) 106, temperature 98.3 F (36.8 C), temperature source Oral, resp. rate (!) 24, height 5\' 8"  (1.727 m), weight 104.3 kg, SpO2 92 %. Body mass index is 34.97 kg/m.  Treatment Plan Summary: Daily contact with patient to assess and evaluate symptoms and progress in treatment and Medication management  Disposition: Recommend psychiatric Inpatient admission when medically cleared. Supportive therapy provided about ongoing stressors. Discussed crisis plan, support from social network, calling 911, coming to the Emergency Department, and calling Suicide Hotline.  , NP 06/28/2021 1:48 AM

## 2021-06-28 NOTE — ED Notes (Signed)
Attempts to place pt on bedside cardiac monitoring unsuccessful. Pt making repeated statements such as " I dont have hands" and and " I dont know how to sit on the stretcher." Multiple attempts made to redirect

## 2021-06-28 NOTE — ED Notes (Addendum)
Pt was given new beh scrubs and mesh underwear to change into. Bed sheets were changed as well due to being wet. Pt was also provided a pad per pt request. Pt opened pad and set pad on bed. When pt saw the pad after setting it down, pt asked "what is this?" This tech told pt it was the pad that she had asked for, pt then asked "where does it go?" This tech told pt it went in her underwear and pt then stuck the pad onto the outside of the underwear. Pt then held beh scrub pants up and proceeded to put them over her head, with her arms in the leg holes. This tech educated pt on the fact that those were her pants. Pt stated "where are they?" this tech told pt they were on her head.pt stated "how did they get there". Pt instructed on how to put on beh scrubs properly.   Pt hooked up to card monitor and pulse ox at this time. When asked to sit on stretcher, pt stated "I don't know how to sit". Pt was given socks to put on feet, however pt put the socks on her hands and when asked about it, she stated " I don't have hands". This tech removed socks from pts hands and placed them on pts feet. Coban was placed around peripheral IV site, as pt was attempting to take off the Tegaderm because "it is hurting me and I am scared". Pt instructed to use call bell if any needs arise and to keep all cardiac leads on as well as BP and pulse ox. Pt repeatedly asking questions such as "do you believe in santa?" And "do you believe in clowns or rudolph?". Pt is also confused as to where she is and how she got here, pt believes Nadene Rubins brought her to the hospital. Pt also asking this tech "do you know my name?" Pt stated she did not know her name and that it was not "Rosey Bath". Call bell within reach, lights dimmed and blanket given for comfort.

## 2021-06-28 NOTE — ED Notes (Signed)
IVC pending placement inpatient

## 2021-06-28 NOTE — ED Notes (Signed)
Obtained verbal permission from pt to speak with daughter Tyrene Nader

## 2021-06-28 NOTE — BH Assessment (Signed)
Referral information for Psychiatric Hospitalization faxed to;   [facility] ([phone]-or- [phone]),   [facility] [phone] -or- [phone]) [phone]  [place] ([phone]---[phone]),  [facility] ([phone], [phone], [phone] or [phone]),   [place] ([phone] or [phone])  [place] [facility] ([phone]),   [facility] ([phone] -or- [phone]),    [place] ([phone])  [place] ([phone] or [phone]),   [place] ([phone]).  [facility] ([phone])  

## 2021-06-28 NOTE — BH Assessment (Signed)
Writer spoke with the patient to complete an updated/reassessment. Patient disorganized and requiring redirecting. Patient believes this writer is her son and was upset because he was acting different towards her. Patient required IM medications.

## 2021-06-28 NOTE — ED Notes (Signed)
Pt. Transferred to [facility] from ED to room 4 after screening for contraband. Report to include Situation, Background, Assessment and Recommendations from [facility]. Pt. Oriented to unit including Q15 minute rounds as well as the security cameras for their protection. Patient is alert and oriented, warm and dry in no acute distress. Patient denies SI, HI, and AVH. Pt. Encouraged to let me know if needs arise.

## 2021-06-28 NOTE — ED Notes (Signed)
Jerilynn Som and Asher Muir went to assess pt and she began grabbing onto [employer] to hug and hold him- security went in to tell pt she needed to keep her hands to herself and she began to get loud and kept trying to grab various security- pt began to calm down a bit and security left- pt was then trying to get into door of nurses station while security was leaving- Celso Amy, NP gave VO- see MAR for admin- pt took meds with minimal assistance

## 2021-06-28 NOTE — ED Provider Notes (Signed)
[facility] Provider Note    Event Date/Time   First MD Initiated Contact with Patient 06/27/21 2257     (approximate)   History   Psychiatric Evaluation   HPI  Anna Adkins is a 56 y.o. female with a history of anxiety, depression, MDD with psychosis, PE/DVT on Eliquis who presents for mental health evaluation.  Patient reports that she is here for "voluntary commitment for mental health issues."  Patient is unable to provide any history that makes sense.  Patient has very tangential thoughts and keeps rambling.  She does express being overwhelmed with things in her life.  Patient does endorse compliance with her Eliquis and denies any leg pain or swelling, chest pain or shortness of breath.     Past Medical History:  Diagnosis Date   Anxiety    Anxiety    Arthritis    Depression    Depression    DVT (deep venous thrombosis) (HCC)    DVT of leg (deep venous thrombosis) (HCC) 2014   Pulmonary emboli (HCC)    TIA (transient ischemic attack) 2014    Past Surgical History:  Procedure Laterality Date   Blood clot removal     ECTOPIC PREGNANCY SURGERY     HERNIA REPAIR     HERNIA REPAIR  2001   SKIN GRAFT     TUBAL LIGATION       Physical Exam   Triage Vital Signs: ED Triage Vitals  Enc Vitals Group     BP 06/27/21 2216 (!) 140/118     Pulse Rate 06/27/21 2216 (!) 106     Resp 06/27/21 2216 (!) 24     Temp 06/27/21 2216 98.3 F (36.8 C)     Temp Source 06/27/21 2216 Oral     SpO2 06/27/21 2216 92 %     Weight 06/27/21 2218 230 lb (104.3 kg)     Height 06/27/21 2218 5\' 8"  (1.727 m)     Head Circumference --      Peak Flow --      Pain Score 06/27/21 2217 6     Pain Loc --      Pain Edu? --      Excl. in GC? --     Most recent vital signs: Vitals:   06/28/21 0330 06/28/21 0608  BP:  (!) 98/57  Pulse: 93 82  Resp:  18  Temp:    SpO2: 96% 97%     Constitutional: Alert and oriented. Well appearing and in no apparent  distress. HEENT:      Head: Normocephalic and atraumatic.         Eyes: Conjunctivae are normal. Sclera is non-icteric.       Mouth/Throat: Mucous membranes are moist.       Neck: Supple with no signs of meningismus. Cardiovascular: Regular rate and rhythm. No murmurs, gallops, or rubs. 2+ symmetrical distal pulses are present in all extremities.  Respiratory: Normal respiratory effort. Lungs are clear to auscultation bilaterally.  Gastrointestinal: Soft, non tender. Musculoskeletal:  No edema, cyanosis, or erythema of extremities. Neurologic:  Face is symmetric. Moving all extremities. No gross focal neurologic deficits are appreciated. Skin: Skin is warm, dry and intact. No rash noted. Psychiatric: Mood and affect are normal.   ED Results / Procedures / Treatments   Labs (all labs ordered are listed, but only abnormal results are displayed) Labs Reviewed  COMPREHENSIVE METABOLIC PANEL - Abnormal; Notable for the following components:  Result Value   Glucose, Bld 120 (*)    Total Protein 8.4 (*)    All other components within normal limits  SALICYLATE LEVEL - Abnormal; Notable for the following components:   Salicylate Lvl <7.0 (*)    All other components within normal limits  ACETAMINOPHEN LEVEL - Abnormal; Notable for the following components:   Acetaminophen (Tylenol), Serum <10 (*)    All other components within normal limits  CBC - Abnormal; Notable for the following components:   RBC 5.14 (*)    HCT 47.0 (*)    All other components within normal limits  SARS CORONAVIRUS 2 BY RT PCR  ETHANOL  URINE DRUG SCREEN, QUALITATIVE ([facility] ONLY)     EKG  none   RADIOLOGY I, Nita Sickle, attending MD, have personally viewed and interpreted the images obtained during this visit as below:  CT negative for PE   ___________________________________________________ Interpretation by Radiologist:  CT Angio Chest PE W and/or Wo Contrast  Result Date:  06/28/2021 CLINICAL DATA:  Pulmonary embolism suspected.  High probability. EXAM: CT ANGIOGRAPHY CHEST WITH CONTRAST TECHNIQUE: Multidetector CT imaging of the chest was performed using the standard protocol during bolus administration of intravenous contrast. Multiplanar CT image reconstructions and MIPs were obtained to evaluate the vascular anatomy. RADIATION DOSE REDUCTION: This exam was performed according to the departmental dose-optimization program which includes automated exposure control, adjustment of the mA and/or kV according to patient size and/or use of iterative reconstruction technique. CONTRAST:  68mL OMNIPAQUE IOHEXOL 350 MG/ML SOLN COMPARISON:  PA and lateral chest 05/17/2020, PA and lateral 04/18/2015 and chest CT with contrast 04/21/2015. FINDINGS: Cardiovascular: The cardiac size is normal. There is no visible coronary artery calcification. There is no evidence of acute right heart strain with again noted mildly prominent pulmonary trunk measuring 3.4 cm indicating arterial hypertension. No arterial embolic filling defect is seen with satisfactory opacification to the segmental arterial level. The pulmonary veins are decompressed. There is atherosclerosis in the aortic arch with scattered calcifications, with no aortic aneurysm or dissection. There is normal great vessel branching and enhancement. Mediastinum/Nodes: No enlarged mediastinal, hilar, or axillary lymph nodes. Thyroid gland, trachea, and esophagus demonstrate no significant findings. Lungs/Pleura: coarse linear scarring is again noted in the lingular and right middle lobe bases and posteriorly in the superior segment of the left lower lobe. There is increased linear atelectasis or scarring anteriorly in the right upper lobe. Mild infrahilar bronchial thickening continues to be noted in the lower lobes. There are chronic small bronchiolar impactions in the medial basal right lower lobe. No focal pneumonia or mass is seen. The  central airways are clear. There is no pleural effusion, thickening or pneumothorax. Upper Abdomen: No acute abnormality.  Chronic hepatic steatosis. Musculoskeletal: No chest wall abnormality. No acute or significant osseous findings. Review of the MIP images confirms the above findings. IMPRESSION: 1. Chronic slight prominence of the pulmonary trunk but no evidence of acute thromboembolism. 2. Evidence for bronchitis in both lower lobes and chronic small airways disease in the medial basal right lower lobe. No acute pneumonia is seen. Scattered areas of scarring. 3. Aortic atherosclerosis. 4. Hepatic steatosis. Electronically Signed   By: Almira Bar M.D.   On: 06/28/2021 05:21       PROCEDURES:  Critical Care performed: No  Procedures    IMPRESSION / MDM / ASSESSMENT AND PLAN / ED COURSE  I reviewed the triage vital signs and the nursing notes.  57 y.o. female with a history  of anxiety, depression, MDD with psychosis, PE/DVT on Eliquis who presents for mental health evaluation.  Initially patient was noted to be tachycardic and slightly hypoxic.  Patient denied chest pain or shortness of breath.  She also endorsed compliance with her Eliquis however since patient seems very confused because of her mental health issues I felt that she needed a CTA to make sure patient did not have another PE.  Luckily the CTA is negative.  Her hypoxia resolved with 2 breathing treatments.  Vitals are now back to normal.  Patient is not requiring any oxygen.  Patient remains with no medical complaints.  COVID-negative.  UDS negative.  Labs for medical clearance with no acute pathology.  Patient is now medically clear for psychiatric evaluation  MEDICATIONS GIVEN IN ED: Medications  albuterol (VENTOLIN HFA) 108 (90 Base) MCG/ACT inhaler 2 puff (has no administration in time range)  ipratropium-albuterol (DUONEB) 0.5-2.5 (3) MG/3ML nebulizer solution 3 mL (3 mLs Nebulization Given 06/28/21 0303)   ipratropium-albuterol (DUONEB) 0.5-2.5 (3) MG/3ML nebulizer solution 3 mL (3 mLs Nebulization Given 06/28/21 0303)  iohexol (OMNIPAQUE) 350 MG/ML injection 75 mL (75 mLs Intravenous Contrast Given 06/28/21 0432)    Consults: Psych   EMR reviewed including records from her last psychiatric admission from a month ago    FINAL CLINICAL IMPRESSION(S) / ED DIAGNOSES   Final diagnoses:  Severe episode of recurrent major depressive disorder, with psychotic features (HCC)     Rx / DC Orders   ED Discharge Orders     None        Note:  This document was prepared using Dragon voice recognition software and may include unintentional dictation errors.   Please note:  Patient was evaluated in Emergency Department today for the symptoms described in the history of present illness. Patient was evaluated in the context of the global COVID-19 pandemic, which necessitated consideration that the patient might be at risk for infection with the SARS-CoV-2 virus that causes COVID-19. Institutional protocols and algorithms that pertain to the evaluation of patients at risk for COVID-19 are in a state of rapid change based on information released by regulatory bodies including the CDC and federal and state organizations. These policies and algorithms were followed during the patient's care in the ED.  Some ED evaluations and interventions may be delayed as a result of limited staffing during the pandemic.       Don Perking, [place], MD 06/28/21 [phone]

## 2021-06-28 NOTE — ED Notes (Signed)
IVC by Nanine Means NP

## 2021-06-28 NOTE — ED Notes (Signed)
Pt became increasingly agitated, was becoming physical with staff. IM PRN order obtained and administrated. Cont to monitor as ordered

## 2021-06-28 NOTE — BH Assessment (Signed)
Comprehensive Clinical Assessment (CCA) Note  06/28/2021 Anna Adkins [id number]  Chief Complaint: Patient is a 56 year old female presenting to [facility] ED voluntarily. Per triage note Pt to ED via POV, states approx 1 yr ago had a panic attack at her aunt's house, then later that day had a breakdown. Pt's family member reports pt is having "mental health issues". States hx of MDD, and that patient reports she has been taking her medications but is unsure. Pt states she is here for voluntary committment. Pt denies SI/HI at this time. Pt with noted rambling speech. Pt states she has not slept and feels "overwhlemed with some thing in my life". During assessment patient appears alert and oriented x4, calm and cooperative. Patient often rambles and has disorganized thinking, when asked a question patient will often go on tangents that are irrelevant to the question. Patient is able to report that she needs help, when asked if she is taking her medications she reports "yes" then goes off in a daze and reports that she can't recall the medications she takes. Patient reports "I was here on mother's day in that room" and then goes off with her disorganized thinking discussing the swelling in her legs and ankles. When asked if patient is sleeping she reports "no" but then reports "I sleep all day when I'm overwhelmed." When asked if patient is experiencing AH she reports "yes, I'm a [place] and listen to music." When asked if patient is experiencing VH she reports "yes, I'm scared of snakes." Towards the end of the assessment patient yelled out "Jesus!" And lifted her hands up towards the ceiling. Patient is able to deny SI/HI.  Per Psyc NP Lerry Liner patient is recommended for Inpatient Chief Complaint  Patient presents with   Psychiatric Evaluation   Visit Diagnosis: Depression with psychosis, Brief reactive psychosis    CCA Screening, Triage and Referral (STR)  Patient Reported Information How did  you hear about [place]? Self  Referral name: No data recorded Referral phone number: No data recorded  Whom do you see for routine medical problems? No data recorded Practice/Facility Name: No data recorded Practice/Facility Phone Number: No data recorded Name of Contact: No data recorded Contact Number: No data recorded Contact Fax Number: No data recorded Prescriber Name: No data recorded Prescriber Address (if known): No data recorded  What Is the Reason for Your Visit/Call Today? Patient presents voluntarily due to patient feeling anxious and overwhelmed  How Long Has This Been Causing You Problems? > than 6 months  What Do You Feel Would Help You the Most Today? Treatment for Depression or other mood problem   Have You Recently Been in Any Inpatient Treatment (Hospital/Detox/Crisis Center/28-Day Program)? No data recorded Name/Location of Program/Hospital:No data recorded How Long Were You There? No data recorded When Were You Discharged? No data recorded  Have You Ever Received Services From [facility] Before? No data recorded Who Do You See at [facility]? No data recorded  Have You Recently Had Any Thoughts About Hurting Yourself? No  Are You Planning to Commit Suicide/Harm Yourself At This time? No   Have you Recently Had Thoughts About Hurting Someone Karolee Ohs? No  Explanation: No data recorded  Have You Used Any Alcohol or Drugs in the Past 24 Hours? No  How Long Ago Did You Use Drugs or Alcohol? No data recorded What Did You Use and How Much? No data recorded  Do You Currently Have a Therapist/Psychiatrist? -- (Unknown)  Name of  Therapist/Psychiatrist: No data recorded  Have You Been Recently Discharged From Any Office Practice or Programs? No  Explanation of Discharge From Practice/Program: No data recorded    CCA Screening Triage Referral Assessment Type of Contact: Face-to-Face  Is this Initial or Reassessment? No data recorded Date Telepsych consult  ordered in [facility]:  No data recorded Time Telepsych consult ordered in [facility]:  No data recorded  Patient Reported Information Reviewed? No data recorded Patient Left Without Being Seen? No data recorded Reason for Not Completing Assessment: No data recorded  Collateral Involvement: No data recorded  Does Patient Have a Court Appointed Legal Guardian? No data recorded Name and Contact of Legal Guardian: No data recorded If Minor and Not Living with Parent(s), Who has Custody? No data recorded Is CPS involved or ever been involved? Never  Is APS involved or ever been involved? Never   Patient Determined To Be At Risk for Harm To Self or Others Based on Review of Patient Reported Information or Presenting Complaint? No  Method: No data recorded Availability of Means: No data recorded Intent: No data recorded Notification Required: No data recorded Additional Information for Danger to Others Potential: No data recorded Additional Comments for Danger to Others Potential: No data recorded Are There Guns or Other Weapons in Your Home? No data recorded Types of Guns/Weapons: No data recorded Are These Weapons Safely Secured?                            No data recorded Who Could Verify You Are Able To Have These Secured: No data recorded Do You Have any Outstanding Charges, Pending Court Dates, Parole/Probation? No data recorded Contacted To Inform of Risk of Harm To Self or Others: No data recorded  Location of Assessment: [facility] ED   Does Patient Present under Involuntary Commitment? No  IVC Papers Initial File Date: No data recorded  [place] of Residence:    Patient Currently Receiving the Following Services: Medication Management   Determination of Need: Emergent (2 hours)   Options For Referral: No data recorded    CCA Biopsychosocial Intake/Chief Complaint:  No data recorded Current Symptoms/Problems: No data recorded  Patient Reported  Schizophrenia/Schizoaffective Diagnosis in Past: -- (Unknown)   Strengths: Patient is able to communicate  Preferences: No data recorded Abilities: No data recorded  Type of Services Patient Feels are Needed: No data recorded  Initial Clinical Notes/Concerns: No data recorded  Mental Health Symptoms Depression:   None   Duration of Depressive symptoms: No data recorded  Mania:   Change in energy/activity; Racing thoughts   Anxiety:    Difficulty concentrating; Fatigue; Tension; Worrying   Psychosis:   Delusions; Grossly disorganized speech   Duration of Psychotic symptoms:  Greater than six months   Trauma:   None   Obsessions:   None   Compulsions:   None   Inattention:   None   Hyperactivity/Impulsivity:   None   Oppositional/Defiant Behaviors:   None   Emotional Irregularity:   None   Other Mood/Personality Symptoms:  No data recorded   Mental Status Exam Appearance and self-care  Stature:   Average   Weight:   Overweight   Clothing:   Casual   Grooming:   Normal   Cosmetic use:   None   Posture/gait:   Normal   Motor activity:   Restless   Sensorium  Attention:   Distractible   Concentration:   Anxiety interferes; Scattered  Orientation:   X5   Recall/memory:   Normal   Affect and Mood  Affect:   Anxious   Mood:   Anxious   Relating  Eye contact:   Normal   Facial expression:   Responsive   Attitude toward examiner:   Cooperative   Thought and Language  Speech flow:  Flight of Ideas   Thought content:   Ideas of Reference   Preoccupation:   Ruminations; Religion   Hallucinations:   Auditory; Visual   Organization:  No data recorded  [employer] of Knowledge:   Fair   Intelligence:   Average   Abstraction:   Functional   Judgement:   Fair   Reality Testing:   Adequate   Insight:   Fair   Decision Making:   Normal   Social Functioning  Social Maturity:    Responsible   Social Judgement:   Normal   Stress  Stressors:   Other (Comment)   Coping Ability:   Exhausted   Skill Deficits:   None   Supports:   Family     Religion: Religion/Spirituality Are You A Religious Person?: Yes What is Your Religious Affiliation?: Environmental consultant: Leisure / Recreation Do You Have Hobbies?: No  Exercise/Diet: Exercise/Diet Do You Exercise?: No Have You Gained or Lost A Significant Amount of Weight in the Past Six Months?: No Do You Follow a Special Diet?: No Do You Have Any Trouble Sleeping?: Yes Explanation of Sleeping Difficulties: At first patient reports that she hasn't been sleeping then she reports that she sleeps all day   CCA Employment/Education Employment/Work Situation: Employment / Work Situation Employment Situation: On disability Why is Patient on Disability: Mental Health How Long has Patient Been on Disability: Unknown Has Patient ever Been in the [employer]?: No  Education: Education Is Patient Currently Attending School?: No Did You Have An Individualized Education Program (IIEP): No Did You Have Any Difficulty At [employer]?: No Patient's Education Has Been Impacted by Current Illness: No   CCA Family/Childhood History Family and Relationship History: Family history Marital status: Single Does patient have children?: Yes How many children?: 3 How is patient's relationship with their children?: Unknown  Childhood History:  Childhood History By whom was/is the patient raised?: Both parents Did patient suffer any verbal/emotional/physical/sexual abuse as a child?:  (UTA) Did patient suffer from severe childhood neglect?:  (UTA) Has patient ever been sexually abused/assaulted/raped as an adolescent or adult?:  (UTA) Was the patient ever a victim of a crime or a disaster?:  (UTA) Witnessed domestic violence?:  (UTA) Has 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)  Child/Adolescent Assessment:     CCA Substance Use Alcohol/Drug Use: Alcohol / Drug Use Pain Medications: See MAR Prescriptions: See MAR Over the Counter: See MAR History of alcohol / drug use?: No history of alcohol / drug abuse                         ASAM's:  Six Dimensions of Multidimensional Assessment  Dimension 1:  Acute Intoxication and/or Withdrawal Potential:      Dimension 2:  Biomedical Conditions and Complications:      Dimension 3:  Emotional, Behavioral, or Cognitive Conditions and Complications:     Dimension 4:  Readiness to Change:     Dimension 5:  Relapse, Continued use, or Continued Problem Potential:     Dimension 6:  Recovery/Living Environment:  ASAM Severity Score:    ASAM Recommended Level of Treatment:     Substance use Disorder (SUD)    Recommendations for Services/Supports/Treatments:    DSM5 Diagnoses: Patient Active Problem List   Diagnosis Date Noted   MDD (major depressive disorder), recurrent, severe, with psychosis (HCC) 05/16/2020   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   Lymphedema 11/13/2019   Patent foramen ovale with right to left shunt 11/06/2019   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   Swelling of limb 11/06/2019   Acute respiratory failure with hypoxia (HCC) 04/18/2015   Atherosclerosis of native artery of extremity with intermittent claudication (HCC) 05/19/2012   Septal defect 05/19/2012   Wound of right groin 05/05/2012   H/O fasciotomy 02/21/2012   Other pulmonary embolism without acute cor pulmonale (HCC) 02/21/2012   Iliac artery occlusion, 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

## 2021-06-29 DIAGNOSIS — Z86711 Personal history of pulmonary embolism: Secondary | ICD-10-CM | POA: Diagnosis not present

## 2021-06-29 DIAGNOSIS — Z8249 Family history of ischemic heart disease and other diseases of the circulatory system: Secondary | ICD-10-CM | POA: Diagnosis not present

## 2021-06-29 DIAGNOSIS — Z6834 Body mass index (BMI) 34.0-34.9, adult: Secondary | ICD-10-CM | POA: Diagnosis not present

## 2021-06-29 DIAGNOSIS — Z20822 Contact with and (suspected) exposure to covid-19: Secondary | ICD-10-CM | POA: Diagnosis present

## 2021-06-29 DIAGNOSIS — Z825 Family history of asthma and other chronic lower respiratory diseases: Secondary | ICD-10-CM | POA: Diagnosis not present

## 2021-06-29 DIAGNOSIS — E669 Obesity, unspecified: Secondary | ICD-10-CM | POA: Diagnosis present

## 2021-06-29 DIAGNOSIS — Z7901 Long term (current) use of anticoagulants: Secondary | ICD-10-CM | POA: Diagnosis not present

## 2021-06-29 DIAGNOSIS — I2699 Other pulmonary embolism without acute cor pulmonale: Secondary | ICD-10-CM | POA: Diagnosis present

## 2021-06-29 DIAGNOSIS — Z720 Tobacco use: Secondary | ICD-10-CM

## 2021-06-29 DIAGNOSIS — J441 Chronic obstructive pulmonary disease with (acute) exacerbation: Secondary | ICD-10-CM

## 2021-06-29 DIAGNOSIS — F410 Panic disorder [episodic paroxysmal anxiety] without agoraphobia: Secondary | ICD-10-CM | POA: Diagnosis present

## 2021-06-29 DIAGNOSIS — M1990 Unspecified osteoarthritis, unspecified site: Secondary | ICD-10-CM | POA: Diagnosis present

## 2021-06-29 DIAGNOSIS — Z86718 Personal history of other venous thrombosis and embolism: Secondary | ICD-10-CM | POA: Diagnosis not present

## 2021-06-29 DIAGNOSIS — R0902 Hypoxemia: Secondary | ICD-10-CM | POA: Diagnosis present

## 2021-06-29 DIAGNOSIS — F333 Major depressive disorder, recurrent, severe with psychotic symptoms: Secondary | ICD-10-CM | POA: Diagnosis present

## 2021-06-29 DIAGNOSIS — Z833 Family history of diabetes mellitus: Secondary | ICD-10-CM | POA: Diagnosis not present

## 2021-06-29 DIAGNOSIS — G459 Transient cerebral ischemic attack, unspecified: Secondary | ICD-10-CM | POA: Insufficient documentation

## 2021-06-29 DIAGNOSIS — I959 Hypotension, unspecified: Secondary | ICD-10-CM | POA: Diagnosis not present

## 2021-06-29 DIAGNOSIS — I82409 Acute embolism and thrombosis of unspecified deep veins of unspecified lower extremity: Secondary | ICD-10-CM | POA: Diagnosis present

## 2021-06-29 DIAGNOSIS — J9601 Acute respiratory failure with hypoxia: Secondary | ICD-10-CM | POA: Diagnosis not present

## 2021-06-29 DIAGNOSIS — Z8673 Personal history of transient ischemic attack (TIA), and cerebral infarction without residual deficits: Secondary | ICD-10-CM | POA: Diagnosis not present

## 2021-06-29 DIAGNOSIS — Z79899 Other long term (current) drug therapy: Secondary | ICD-10-CM | POA: Diagnosis not present

## 2021-06-29 DIAGNOSIS — F17210 Nicotine dependence, cigarettes, uncomplicated: Secondary | ICD-10-CM | POA: Diagnosis present

## 2021-06-29 DIAGNOSIS — F418 Other specified anxiety disorders: Secondary | ICD-10-CM | POA: Insufficient documentation

## 2021-06-29 LAB — HIV ANTIBODY (ROUTINE TESTING W REFLEX): HIV Screen 4th Generation wRfx: NONREACTIVE

## 2021-06-29 LAB — RESPIRATORY PANEL BY PCR

## 2021-06-29 MED ORDER — ACETAMINOPHEN 325 MG PO TABS
650.0000 mg | ORAL_TABLET | Freq: Four times a day (QID) | ORAL | Status: DC | PRN
  Administered 2021-06-30: 650 mg via ORAL
  Filled 2021-06-29: qty 2

## 2021-06-29 MED ORDER — IPRATROPIUM-ALBUTEROL 0.5-2.5 (3) MG/3ML IN SOLN
3.0000 mL | Freq: Once | RESPIRATORY_TRACT | Status: AC
  Administered 2021-06-29: 3 mL via RESPIRATORY_TRACT
  Filled 2021-06-29: qty 3

## 2021-06-29 MED ORDER — PREDNISONE 50 MG PO TABS
50.0000 mg | ORAL_TABLET | Freq: Every day | ORAL | Status: DC
  Administered 2021-06-30 – 2021-07-02 (×3): 50 mg via ORAL
  Filled 2021-06-29 (×3): qty 1

## 2021-06-29 MED ORDER — ENOXAPARIN SODIUM 40 MG/0.4ML IJ SOSY: 40.0000 mg | PREFILLED_SYRINGE | INTRAMUSCULAR | Status: DC

## 2021-06-29 MED ORDER — DM-GUAIFENESIN ER 30-600 MG PO TB12
1.0000 | ORAL_TABLET | Freq: Two times a day (BID) | ORAL | Status: DC | PRN
  Administered 2021-07-01: 1 via ORAL
  Filled 2021-06-29 (×2): qty 1

## 2021-06-29 MED ORDER — AZITHROMYCIN 500 MG PO TABS
500.0000 mg | ORAL_TABLET | Freq: Every day | ORAL | Status: AC
  Administered 2021-06-29: 500 mg via ORAL
  Filled 2021-06-29: qty 1

## 2021-06-29 MED ORDER — AZITHROMYCIN 250 MG PO TABS
250.0000 mg | ORAL_TABLET | Freq: Every day | ORAL | Status: DC
  Administered 2021-06-30 – 2021-07-02 (×3): 250 mg via ORAL
  Filled 2021-06-29 (×3): qty 1

## 2021-06-29 MED ORDER — IPRATROPIUM-ALBUTEROL 0.5-2.5 (3) MG/3ML IN SOLN
6.0000 mL | Freq: Once | RESPIRATORY_TRACT | Status: AC
  Administered 2021-06-29: 6 mL via RESPIRATORY_TRACT
  Filled 2021-06-29: qty 6

## 2021-06-29 MED ORDER — NICOTINE 21 MG/24HR TD PT24
21.0000 mg | MEDICATED_PATCH | Freq: Every day | TRANSDERMAL | Status: DC
  Administered 2021-06-30 – 2021-07-02 (×3): 21 mg via TRANSDERMAL
  Filled 2021-06-29 (×4): qty 1

## 2021-06-29 MED ORDER — ONDANSETRON HCL 4 MG/2ML IJ SOLN: 4.0000 mg | Freq: Three times a day (TID) | INTRAMUSCULAR | Status: DC | PRN

## 2021-06-29 MED ORDER — IPRATROPIUM-ALBUTEROL 0.5-2.5 (3) MG/3ML IN SOLN
3.0000 mL | RESPIRATORY_TRACT | Status: DC
  Administered 2021-06-29 – 2021-07-01 (×6): 3 mL via RESPIRATORY_TRACT
  Filled 2021-06-29 (×6): qty 3

## 2021-06-29 MED ORDER — ALBUTEROL SULFATE (2.5 MG/3ML) 0.083% IN NEBU
2.5000 mg | INHALATION_SOLUTION | RESPIRATORY_TRACT | Status: DC | PRN
  Administered 2021-06-30 – 2021-07-01 (×2): 2.5 mg via RESPIRATORY_TRACT
  Filled 2021-06-29 (×2): qty 3

## 2021-06-29 MED ORDER — PREDNISONE 20 MG PO TABS
60.0000 mg | ORAL_TABLET | Freq: Once | ORAL | Status: AC
  Administered 2021-06-29: 60 mg via ORAL
  Filled 2021-06-29: qty 3

## 2021-06-29 MED ORDER — LORAZEPAM 2 MG/ML IJ SOLN
1.0000 mg | Freq: Four times a day (QID) | INTRAMUSCULAR | Status: DC | PRN
  Administered 2021-06-29 (×2): 1 mg via INTRAVENOUS
  Filled 2021-06-29 (×2): qty 1

## 2021-06-29 NOTE — H&P (Signed)
History and Physical    Anna Adkins [id number] DOB: 11-Nov-1965 DOA: 06/27/2021  Referring MD/NP/PA:   PCP: Ane Payment, [place]   Patient coming from:  The patient is coming from home.   Chief Complaint: Medical problem and shortness of breath  HPI: Anna Adkins is a 56 y.o. female with medical history significant for PE/DVT on Eliquis, TIA, depression with anxiety, panic attack, smoker, obesity with BMI 34.97, who presents with mental problem and shortness breath.  Patient has been in the emergency room since 6/17.  Initially patient is brought and IVC'ed at due to mental problem.  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  Patient reports feeling overwhelmed, and rambling in talking.  She denies suicidal or homicidal ideations.  Patient was noted to have oxygen desaturation to 81% on room air, normally not using oxygen.  Patient has dry cough and mild shortness breath, no active chest pain.  No fever or chills.  Does not seem to have acute respiratory distress.  Denies nausea, vomiting, diarrhea or abdominal pain Or symptoms of UTI.  Data Reviewed and ED Course: pt was found to have WBC 8.6, negative UDS, negative COVID PCR, electrolytes renal function okay, urinalysis with squamous cell contamination, temperature normal, blood pressure 107/51, heart rate 145, 112, RR 18.  CT angiogram chest yesterday showed negative PE, but showed bronchitis.  Patient is admitted to telemetry bed as inpatient. Anette Riedel, NP of psychiatry is consulted.   EKG: I have personally reviewed.  Sinus rhythm, QTc 439, low voltage, nonspecific T wave change.   Review of Systems:   General: no fevers, chills, no body weight gain, has fatigue HEENT: no blurry vision, hearing changes or sore throat Respiratory: has dyspnea, coughing, wheezing CV: no chest pain, no palpitations GI: no nausea, vomiting, abdominal pain,  diarrhea, constipation GU: no dysuria, burning on urination, increased urinary frequency, hematuria  Ext: Has trace leg edema Neuro: no unilateral weakness, numbness, or tingling, no vision change or hearing loss Skin: no rash, no skin tear. MSK: No muscle spasm, no deformity, no limitation of range of movement in spin Heme: No easy bruising.  Travel history: No recent long distant travel. Psychiatry: Patient has depression with psychosis.  Denies suicidal homicidal ideations   Allergy:  Allergies  Allergen Reactions   Sulfa Antibiotics Anaphylaxis    Past Medical History:  Diagnosis Date   Anxiety    Anxiety    Arthritis    Depression    Depression    DVT (deep venous thrombosis) (HCC)    DVT of leg (deep venous thrombosis) ([place]) 2014   Pulmonary emboli (HCC)    TIA (transient ischemic attack) 2014    Past Surgical History:  Procedure Laterality Date   Blood clot removal     ECTOPIC PREGNANCY SURGERY     HERNIA REPAIR     HERNIA REPAIR  2001   SKIN GRAFT     TUBAL LIGATION      Social History:  reports that she has been smoking. She has a 20.00 pack-year smoking history. She has never used smokeless tobacco. She reports current alcohol use of about 1.0 standard drink of alcohol per week. She reports that she does not use drugs.  Family History:  Family History  Problem Relation Age of Onset   Healthy Mother    Heart disease Father    Diabetes Father    Asthma Father    Breast cancer Neg  Hx      Prior to Admission medications   Medication Sig Start Date End Date Taking? Authorizing Provider  ELIQUIS 5 MG TABS tablet Take 5 mg by mouth 2 (two) times daily. 12/18/19  Yes [provider]  escitalopram (LEXAPRO) 20 MG tablet Take 20 mg by mouth daily. 05/30/21  Yes [provider]  loratadine (CLARITIN) 10 MG tablet Take 10 mg by mouth daily.   Yes [provider]  Multiple Vitamin (MULTIVITAMIN WITH MINERALS) TABS tablet Take 1 tablet by  mouth daily.   Yes [provider]  VENTOLIN HFA 108 (90 Base) MCG/ACT inhaler Inhale 1-2 puffs into the lungs every 4 (four) hours as needed. 08/31/17  Yes [provider]  risperiDONE (RISPERDAL M-TABS) 0.5 MG disintegrating tablet Take 1 tablet (0.5 mg total) by mouth daily. Patient not taking: Reported on 06/27/2021 05/20/20   Laveda Abbe, NP  risperiDONE (RISPERDAL M-TABS) 1 MG disintegrating tablet Take 1 tablet (1 mg total) by mouth at bedtime. Patient not taking: Reported on 06/27/2021 05/19/20   Laveda Abbe, NP  traZODone (DESYREL) 50 MG tablet Take 1 tablet (50 mg total) by mouth at bedtime. Patient not taking: Reported on 06/27/2021 05/19/20   Laveda Abbe, NP    Physical Exam: Vitals:   06/29/21 0025 06/29/21 0811 06/29/21 0905 06/29/21 1230  BP: 123/73 (!) 107/51    Pulse: (!) 145 (!) 112  96  Resp: 18 18  (!) 25  Temp: 98.1 F (36.7 C) 98 F (36.7 C)    TempSrc: Oral Oral    SpO2: (!) 85% (!) 85% (!) 85% 94%  Weight:      Height:       General: Not in acute distress HEENT:       Eyes: PERRL, EOMI, no scleral icterus.       ENT: No discharge from the ears and nose, no pharynx injection, no tonsillar enlargement.        Neck: No JVD, no bruit, no mass felt. Heme: No neck lymph node enlargement. Cardiac: S1/S2, RRR, No murmurs, No gallops or rubs. Respiratory: Has wheezing bilaterally  GI: Soft, nondistended, nontender, no rebound pain, no organomegaly, BS present. GU: No hematuria Ext: Has trace leg edema bilaterally. 1+DP/PT pulse bilaterally. Musculoskeletal: No joint deformities, No joint redness or warmth, no limitation of ROM in spin. Skin: No rashes.  Neuro: Alert, oriented X3, cranial nerves II-XII grossly intact, moves all extremities  Psych: Patient has depression with psychosis.  Denies suicidal homicidal ideations  Labs on Admission: I have personally reviewed following labs and imaging studies  CBC: Recent Labs   Lab 06/27/21 2221  WBC 8.6  HGB 15.0  HCT 47.0*  MCV 91.4  PLT 272   Basic Metabolic Panel: Recent Labs  Lab 06/27/21 2221  NA 141  K 3.8  CL 105  CO2 26  GLUCOSE 120*  BUN 12  CREATININE 0.84  CALCIUM 9.6   GFR: Estimated Creatinine Clearance: 93.4 mL/min (by C-G formula based on SCr of 0.84 mg/dL). Liver Function Tests: Recent Labs  Lab 06/27/21 2221  AST 18  ALT 15  ALKPHOS 60  BILITOT 0.8  PROT 8.4*  ALBUMIN 4.3   No results for input(s): "LIPASE", "AMYLASE" in the last 168 hours. No results for input(s): "AMMONIA" in the last 168 hours. Coagulation Profile: No results for input(s): "INR", "PROTIME" in the last 168 hours. Cardiac Enzymes: No results for input(s): "CKTOTAL", "CKMB", "CKMBINDEX", "TROPONINI" in the last 168 hours.  BNP (last 3 results) No results for input(s): "PROBNP" in the last 8760 hours. HbA1C: No results for input(s): "HGBA1C" in the last 72 hours. CBG: No results for input(s): "GLUCAP" in the last 168 hours. Lipid Profile: No results for input(s): "CHOL", "HDL", "LDLCALC", "TRIG", "CHOLHDL", "LDLDIRECT" in the last 72 hours. Thyroid Function Tests: Recent Labs    06/28/21 0319  TSH 6.221*   Anemia Panel: No results for input(s): "VITAMINB12", "FOLATE", "FERRITIN", "TIBC", "IRON", "RETICCTPCT" in the last 72 hours. Urine analysis:    Component Value Date/Time   COLORURINE AMBER (A) 06/27/2021 2222   APPEARANCEUR HAZY (A) 06/27/2021 2222   APPEARANCEUR Clear 12/12/2017 0826   LABSPEC 1.021 06/27/2021 2222   PHURINE 5.0 06/27/2021 2222   GLUCOSEU NEGATIVE 06/27/2021 2222   HGBUR NEGATIVE 06/27/2021 2222   BILIRUBINUR NEGATIVE 06/27/2021 2222   BILIRUBINUR Negative 12/12/2017 0826   KETONESUR 5 (A) 06/27/2021 2222   PROTEINUR 100 (A) 06/27/2021 2222   NITRITE NEGATIVE 06/27/2021 2222   LEUKOCYTESUR NEGATIVE 06/27/2021 2222   Sepsis Labs: @LABRCNTIP (procalcitonin:4,lacticidven:4) ) Recent Results (from the past 240  hour(s))  SARS Coronavirus 2 by RT PCR (hospital order, performed in [facility] hospital lab) *cepheid single result test* Anterior Nasal Swab     Status: None   Collection Time: 06/28/21  1:11 AM   Specimen: Anterior Nasal Swab  Result Value Ref Range Status   SARS Coronavirus 2 by RT PCR NEGATIVE NEGATIVE Final    Comment: (NOTE) SARS-CoV-2 target nucleic acids are NOT DETECTED.  The SARS-CoV-2 RNA is generally detectable in upper and lower respiratory specimens during the acute phase of infection. The lowest concentration of SARS-CoV-2 viral copies this assay can detect is 250 copies / mL. A negative result does not preclude SARS-CoV-2 infection and should not be used as the sole basis for treatment or other patient management decisions.  A negative result may occur with improper specimen collection / handling, submission of specimen other than nasopharyngeal swab, presence of viral mutation(s) within the areas targeted by this assay, and inadequate number of viral copies (<250 copies / mL). A negative result must be combined with clinical observations, patient history, and epidemiological information.  Fact Sheet for Patients:   https://www.patel.info/  Fact Sheet for Healthcare Providers: https://hall.com/  This test is not yet approved or  cleared by the [place] FDA and has been authorized for detection and/or diagnosis of SARS-CoV-2 by FDA under an Emergency Use Authorization (EUA).  This EUA will remain in effect (meaning this test can be used) for the duration of the COVID-19 declaration under Section 564(b)(1) of the Act, 21 U.S.C. section 360bbb-3(b)(1), unless the authorization is terminated or revoked sooner.  Performed at [facility], [address] [place], [place] [address]   Resp Panel by RT-PCR (Flu A&B, Covid) Anterior Nasal Swab     Status: None   Collection Time: 06/28/21  1:11 AM   Specimen:  Anterior Nasal Swab  Result Value Ref Range Status   SARS Coronavirus 2 by RT PCR NEGATIVE NEGATIVE Final    Comment: (NOTE) SARS-CoV-2 target nucleic acids are NOT DETECTED.  The SARS-CoV-2 RNA is generally detectable in upper respiratory specimens during the acute phase of infection. The lowest concentration of SARS-CoV-2 viral copies this assay can detect is 138 copies/mL. A negative result does not preclude SARS-Cov-2 infection and should not be used as the sole basis for treatment or other patient management decisions. A negative result may occur with  improper specimen collection/handling, submission  of specimen other than nasopharyngeal swab, presence of viral mutation(s) within the areas targeted by this assay, and inadequate number of viral copies(<138 copies/mL). A negative result must be combined with clinical observations, patient history, and epidemiological information. The expected result is Negative.  Fact Sheet for Patients:  EntrepreneurPulse.com.au  Fact Sheet for Healthcare Providers:  IncredibleEmployment.be  This test is no t yet approved or cleared by the [place] FDA and  has been authorized for detection and/or diagnosis of SARS-CoV-2 by FDA under an Emergency Use Authorization (EUA). This EUA will remain  in effect (meaning this test can be used) for the duration of the COVID-19 declaration under Section 564(b)(1) of the Act, 21 U.S.C.section 360bbb-3(b)(1), unless the authorization is terminated  or revoked sooner.       Influenza A by PCR NEGATIVE NEGATIVE Final   Influenza B by PCR NEGATIVE NEGATIVE Final    Comment: (NOTE) The Xpert Xpress SARS-CoV-2/FLU/RSV plus assay is intended as an aid in the diagnosis of influenza from Nasopharyngeal swab specimens and should not be used as a sole basis for treatment. Nasal washings and aspirates are unacceptable for Xpert Xpress SARS-CoV-2/FLU/RSV testing.  Fact  Sheet for Patients: EntrepreneurPulse.com.au  Fact Sheet for Healthcare Providers: IncredibleEmployment.be  This test is not yet approved or cleared by the [place] FDA and has been authorized for detection and/or diagnosis of SARS-CoV-2 by FDA under an Emergency Use Authorization (EUA). This EUA will remain in effect (meaning this test can be used) for the duration of the COVID-19 declaration under Section 564(b)(1) of the Act, 21 U.S.C. section 360bbb-3(b)(1), unless the authorization is terminated or revoked.  Performed at [facility], [address] [place], [place] [address]      Radiological Exams on Admission: CT Angio Chest PE W and/or Wo Contrast  Result Date: 06/28/2021 CLINICAL DATA:  Pulmonary embolism suspected.  High probability. EXAM: CT ANGIOGRAPHY CHEST WITH CONTRAST TECHNIQUE: Multidetector CT imaging of the chest was performed using the standard protocol during bolus administration of intravenous contrast. Multiplanar CT image reconstructions and MIPs were obtained to evaluate the vascular anatomy. RADIATION DOSE REDUCTION: This exam was performed according to the departmental dose-optimization program which includes automated exposure control, adjustment of the mA and/or kV according to patient size and/or use of iterative reconstruction technique. CONTRAST:  93mL OMNIPAQUE IOHEXOL 350 MG/ML SOLN COMPARISON:  PA and lateral chest 05/17/2020, PA and lateral 04/18/2015 and chest CT with contrast 04/21/2015. FINDINGS: Cardiovascular: The cardiac size is normal. There is no visible coronary artery calcification. There is no evidence of acute right heart strain with again noted mildly prominent pulmonary trunk measuring 3.4 cm indicating arterial hypertension. No arterial embolic filling defect is seen with satisfactory opacification to the segmental arterial level. The pulmonary veins are decompressed. There is atherosclerosis  in the aortic arch with scattered calcifications, with no aortic aneurysm or dissection. There is normal great vessel branching and enhancement. Mediastinum/Nodes: No enlarged mediastinal, hilar, or axillary lymph nodes. Thyroid gland, trachea, and esophagus demonstrate no significant findings. Lungs/Pleura: coarse linear scarring is again noted in the lingular and right middle lobe bases and posteriorly in the superior segment of the left lower lobe. There is increased linear atelectasis or scarring anteriorly in the right upper lobe. Mild infrahilar bronchial thickening continues to be noted in the lower lobes. There are chronic small bronchiolar impactions in the medial basal right lower lobe. No focal pneumonia or mass is seen. The central airways are clear. There is no pleural  effusion, thickening or pneumothorax. Upper Abdomen: No acute abnormality.  Chronic hepatic steatosis. Musculoskeletal: No chest wall abnormality. No acute or significant osseous findings. Review of the MIP images confirms the above findings. IMPRESSION: 1. Chronic slight prominence of the pulmonary trunk but no evidence of acute thromboembolism. 2. Evidence for bronchitis in both lower lobes and chronic small airways disease in the medial basal right lower lobe. No acute pneumonia is seen. Scattered areas of scarring. 3. Aortic atherosclerosis. 4. Hepatic steatosis. Electronically Signed   By: Telford Nab M.D.   On: 06/28/2021 05:21      Assessment/Plan Principal Problem:   COPD exacerbation (HCC) Active Problems:   MDD (major depressive disorder), recurrent, severe, with psychosis ([place])   DVT (deep venous thrombosis) (HCC)   Pulmonary embolism (HCC)   Tobacco abuse   Obesity (BMI 30-39.9)   Principal Problem:   COPD exacerbation ([place]) Active Problems:   MDD (major depressive disorder), recurrent, severe, with psychosis ([place])   DVT (deep venous thrombosis) ([place])   Pulmonary embolism (HCC)   Tobacco abuse    Obesity (BMI 30-39.9)   Assessment and Plan: * COPD exacerbation ([place]) Patient has oxygen desaturation to 81% on room air, has 2 liters of new oxygen requirement.  She has cough and mild shortness breath, but no acute respiratory distress.  Clinically does not have acute respiratory failure with hypoxia.  Patient does not carry diagnosis of COPD, but she is a smoker, and has wheezing on auscultation, indicating possible undiagnosed COPD with acute exacerbation.  CTA negative for PE.  COVID PCR negative.  - will admit to tele bed as inpatient -Bronchodilators -Prednisone 50 mg daily started in ED, will continue -Z pak  -Mucinex for cough  -Incentive spirometry -sputum culture -check RVP -Nasal cannula oxygen as needed to maintain O2 saturation 93% or greater    MDD (major depressive disorder), recurrent, severe, with psychosis ([place]) Consulted psychiatrist, patient was evaluated by NP, Rashuan Dixon.  Denies suicidal homicidal ideations.  Patient is calm now.  -Patient is IVC'ed -Patient was given 2 mg of Ativan, Geodon, asenapine, Benadryl in ED -Continue risperidone, trazodone  DVT (deep venous thrombosis) (HCC) Hx of DVT and PE: -Continue Eliquis  Pulmonary embolism (HCC) - Continue Eliquis  Tobacco abuse - Nicotine patch -Did counseling about importance of quitting tobacco abuse  Obesity (BMI 30-39.9)  BMI= 34.97  and BW= 104.3 -Diet and exercise.   -Encouraged to lose weight.              DVT ppx: SQ Lovenox  Code Status: Full code  Family Communication: not done, no family member is at bed side.        Disposition Plan: Likely go to behavioral health unit  Consults called: Psychiatry's  Admission status and Level of care: Telemetry Medical:     as inpt       Severity of Illness:  The appropriate patient status for this patient is INPATIENT. Inpatient status is judged to be reasonable and necessary in order to provide the required intensity of  service to ensure the patient's safety. The patient's presenting symptoms, physical exam findings, and initial radiographic and laboratory data in the context of their chronic comorbidities is felt to place them at high risk for further clinical deterioration. Furthermore, it is not anticipated that the patient will be medically stable for discharge from the hospital within 2 midnights of admission.   * I certify that at the point of admission it is my clinical judgment that  the patient will require inpatient hospital care spanning beyond 2 midnights from the point of admission due to high intensity of service, high risk for further deterioration and high frequency of surveillance required.*       Date of Service 06/29/2021    Lorretta Harp [facility]   If 7PM-7AM, please contact night-coverage [email or web address] 06/29/2021, 12:58 PM

## 2021-06-29 NOTE — ED Notes (Signed)
EDP called RN to have pt's VS re checked.  EDP made aware of current VS.

## 2021-06-29 NOTE — ED Notes (Signed)
IVC/pending Placement 

## 2021-06-29 NOTE — ED Notes (Signed)
Daughter at bedside visiting.

## 2021-06-29 NOTE — ED Notes (Signed)
Pt O2 sat was 85% on RA while sitting in bed. Pt put on 4L of O2 and O2 sat approved to 93%. Dr. Katrinka Blazing made aware.

## 2021-06-29 NOTE — ED Notes (Signed)
Pt yelling and singing in her room. RN reoriented pt and encouraged pt not to yell out. Pt agreeable and stopped yelling/singing.

## 2021-06-29 NOTE — ED Notes (Signed)
Pt ambulatory to the bathroom without difficulty.

## 2021-06-29 NOTE — ED Notes (Signed)
IVC  GOING  TO  MEDICAL  FLOOR

## 2021-06-29 NOTE — Assessment & Plan Note (Signed)
Psychiatry following.  Initially, patient was involuntarily committed in the emergency room.  She is calm currently denies any suicidal or homicidal ideations. -Patient was given 2 mg of Ativan, Geodon, asenapine, Benadryl in ED -Continue risperidone, trazodone Based off of psychiatry's note on 6/20, patient does not appear to be suicidal, not dangerous and does not need commitment and she appears to be cooperative with treatment.  Currently the plan is to not place her in under commitment.  For now, leave safety sitter in place. Monitor overnight and plan is to reassess from a psychiatric standpoint on 6/21.

## 2021-06-29 NOTE — Assessment & Plan Note (Signed)
-   Continue Eliquis 

## 2021-06-29 NOTE — ED Notes (Signed)
Patient daughter called to check on the patient. No issues.

## 2021-06-29 NOTE — Assessment & Plan Note (Signed)
  BMI= 34.97  and BW= 104.3 -Diet and exercise.   -Encouraged to lose weight.

## 2021-06-29 NOTE — ED Provider Notes (Addendum)
Patient was in her behavioral health unit awaiting psychiatric placement.  During routine vitals she was noted to be hypoxic and brought back up to the main side of the ED and I evaluate her this morning in the setting of acute hypoxia.  She reports feeling fine and has no complaints.  No dyspnea.  When she was initially evaluated about 36 hours ago she had a CTA chest without evidence of acute PE considering her history of this.  She remains on Eliquis.  When I evaluate her, she is on nasal cannula with minimal tachypnea.  Is noted to have diffuse expiratory wheezes and decreased airflow throughout consistent with a COPD or asthma exacerbation.  She received multiple DuoNebs and is started on steroids.  Despite this, she is still wheezing and still hypoxic, dropping to 83% on room air with a good waveform after these interventions.  Despite her psychiatric illness I believe she needs medical admission.  I will consult with hospitalist  .Critical Care  Performed by: Delton Prairie, MD Authorized by: Delton Prairie, MD   Critical care provider statement:    Critical care time (minutes):  30   Critical care time was exclusive of:  Separately billable procedures and treating other patients   Critical care was necessary to treat or prevent imminent or life-threatening deterioration of the following conditions:  Respiratory failure   Critical care was time spent personally by me on the following activities:  Development of treatment plan with patient or surrogate, discussions with consultants, evaluation of patient's response to treatment, examination of patient, ordering and review of laboratory studies, ordering and review of radiographic studies, ordering and performing treatments and interventions, pulse oximetry, re-evaluation of patient's condition and review of old charts     Delton Prairie, MD 06/29/21 1122    Delton Prairie, MD 06/29/21 1123

## 2021-06-29 NOTE — Assessment & Plan Note (Signed)
-   Nicotine patch -Did counseling about importance of quitting tobacco abuse

## 2021-06-29 NOTE — Assessment & Plan Note (Signed)
Patient has oxygen desaturation to 81% on room air, has 2 liters of new oxygen requirement.  She has cough and mild shortness breath, but no acute respiratory distress.  Clinically does not have acute respiratory failure with hypoxia.  Patient does not carry diagnosis of COPD, but she is a smoker, and has wheezing on auscultation, indicating possible undiagnosed COPD with acute exacerbation.  CTA negative for PE.  COVID PCR negative.  Currently patient has good oxygen saturations on room air.  Her lung exam is clear.  We will check ambulatory pulse ox in the morning.  Check procalcitonin to see if antibiotics need to be continued.

## 2021-06-29 NOTE — ED Notes (Signed)
Pt walking around her room without her O2 on. RN reoriented pt and applied O2. Pt encouraged to leave O2 on.

## 2021-06-29 NOTE — ED Provider Notes (Addendum)
Emergency Medicine Observation Re-evaluation Note  Anna Adkins is a 56 y.o. female, seen on rounds today.  Pt initially presented to the ED for complaints of Psychiatric Evaluation  Currently, the patient is asleep in bed   Physical Exam  Blood pressure 123/73, pulse (!) 145, temperature 98.1 F (36.7 C), temperature source Oral, resp. rate 18, height 5\' 8"  (1.727 m), weight 104.3 kg, SpO2 (!) 85 %.  Physical Exam Physical Exam General: No apparent distress Pulm: Normal WOB Neuro: resting in bed      ED Course / MDM     I have reviewed the labs performed to date as well as medications administered while in observation.  Recent changes in the last 24 hours include none   Plan   Current plan is to continue to wait for psych plan/placement if felt warranted  Patient is under full IVC at this time.  I noted the vital signs were abnormal at midnight of last night so I have asked for a recheck of oxygen and pulse rate.   , MD 06/29/21 07/01/21    [id number], MD 06/29/21 [phone]

## 2021-06-29 NOTE — Assessment & Plan Note (Signed)
Hx of DVT and PE: -Continue Eliquis

## 2021-06-29 NOTE — ED Notes (Signed)
Pt calling out for her dog. RN reoriented pt and reminded her that she was in the hospital and her dog was not here. Pt expressed she was feeling anxious and was unsure of what she should do here. RN encouraged pt to return to her stretcher and put her O2 back on. Pt agreeable and returned to stretcher.

## 2021-06-29 NOTE — ED Notes (Signed)
Patient in the restroom.

## 2021-06-30 DIAGNOSIS — J441 Chronic obstructive pulmonary disease with (acute) exacerbation: Secondary | ICD-10-CM | POA: Diagnosis not present

## 2021-06-30 DIAGNOSIS — I959 Hypotension, unspecified: Secondary | ICD-10-CM

## 2021-06-30 DIAGNOSIS — E669 Obesity, unspecified: Secondary | ICD-10-CM | POA: Diagnosis not present

## 2021-06-30 DIAGNOSIS — F333 Major depressive disorder, recurrent, severe with psychotic symptoms: Secondary | ICD-10-CM | POA: Diagnosis not present

## 2021-06-30 LAB — PROCALCITONIN: Procalcitonin: <0.1 ng/mL

## 2021-06-30 LAB — CBC
HCT: 39.8 % (ref 36.0–46.0)
Hemoglobin: 12.6 g/dL (ref 12.0–15.0)
MCH: 29.3 pg (ref 26.0–34.0)
MCHC: 31.7 g/dL (ref 30.0–36.0)
MCV: 92.6 fL (ref 80.0–100.0)
Platelets: 214 10*3/uL (ref 150–400)
RBC: 4.3 MIL/uL (ref 3.87–5.11)
RDW: 13.5 % (ref 11.5–15.5)
WBC: 9.7 10*3/uL (ref 4.0–10.5)
nRBC: 0 % (ref 0.0–0.2)

## 2021-06-30 LAB — BASIC METABOLIC PANEL
Anion gap: 6 (ref 5–15)
BUN: 18 mg/dL (ref 6–20)
CO2: 30 mmol/L (ref 22–32)
Calcium: 9.1 mg/dL (ref 8.9–10.3)
Chloride: 104 mmol/L (ref 98–111)
Creatinine, Ser: 0.64 mg/dL (ref 0.44–1.00)
GFR, Estimated: >60 mL/min (ref >60)
Glucose, Bld: 98 mg/dL (ref 70–99)
Potassium: 4.1 mmol/L (ref 3.5–5.1)
Sodium: 140 mmol/L (ref 135–145)

## 2021-06-30 MED ORDER — SODIUM CHLORIDE 0.9 % IV SOLN: INTRAVENOUS | Status: DC

## 2021-06-30 MED ORDER — RISPERIDONE 1 MG PO TABS
1.0000 mg | ORAL_TABLET | Freq: Two times a day (BID) | ORAL | Status: DC
  Administered 2021-06-30 – 2021-07-02 (×4): 1 mg via ORAL
  Filled 2021-06-30 (×4): qty 1

## 2021-06-30 MED ORDER — LORAZEPAM 2 MG/ML IJ SOLN
0.5000 mg | Freq: Four times a day (QID) | INTRAMUSCULAR | Status: DC | PRN
Start: 2021-06-30 — End: 2021-07-02

## 2021-06-30 NOTE — Assessment & Plan Note (Signed)
Secondary COPD exacerbation.  Looks to have mostly resolved.  Continue steroids.  Off of oxygen at this point.

## 2021-06-30 NOTE — Progress Notes (Signed)
Safety sitter with patient all shift

## 2021-06-30 NOTE — Assessment & Plan Note (Signed)
Patient's blood pressure has ranged from the 120s systolic to high 84Q.  This is likely in part due to her psychiatry and agitation medications.  I have ordered a fluid bolus.  Recheck blood pressure in the morning.

## 2021-06-30 NOTE — Hospital Course (Signed)
55 year old female with past medical history of PE on Eliquis, TIA, depression and anxiety, tobacco abuse and obesity who presented to the emergency room on 6/17 for voluntary commitment.  She denies any suicidal or homicidal ideations.  She was noted at times however to have rambling speech.  It was recommended that patient undergo inpatient psychiatric admission.  On 6/19, patient noted to have oxygen desaturation of 81% on room air as well as a dry cough and some mild dyspnea on exertion.  Work-up unrevealing and patient felt to have a possible COPD exacerbation.  She was admitted to the hospitalist service and started on antibiotics, oxygen and nebulizers.

## 2021-06-30 NOTE — Plan of Care (Signed)

## 2021-06-30 NOTE — Consult Note (Signed)
[facility] Face-to-Face Psychiatry Consult   Reason for Consult: Follow-up consult 56 year old woman who probably has bipolar disorder.  She came into the emergency room with psychiatric symptoms but was admitted to the medical service because of desaturation and shortness of breath Referring Physician: Rito Ehrlich Patient Identification: Anna Adkins MRN:  [id number] Principal Diagnosis: COPD exacerbation (HCC) Diagnosis:  Principal Problem:   COPD exacerbation (HCC) Active Problems:   Severe episode of recurrent major depressive disorder, with psychotic features (HCC)   DVT (deep venous thrombosis) (HCC)   Pulmonary embolism (HCC)   Tobacco abuse   Obesity (BMI 30-39.9)   Total Time spent with patient: 30 minutes  Subjective:   Anna Adkins is a 56 y.o. female patient admitted with "I told my daughter to bring me in".  HPI: Patient seen and chart reviewed.  Patient came to the emergency room with disorganized thinking mood lability confusion and possibly delusions.  Was admitted to the medical service because of oxygen desaturation.  On interview today the patient says she is feeling a little better.  Slept better last night.  Denies suicidal or homicidal thoughts.  Patient mostly stayed coherent and rational during our conversation but had some rambling to her thoughts at times.  Seem to still hold some idiosyncratic believes.  She stated she thought that she might need to continue staying in the hospital on the psychiatric service a little longer although she is feeling better.  Denies any wish to get up and elope from the hospital.  After I left I am told the patient became more agitated started demanding that the sitter not leave the room.  I also see notes indicating she gets agitated sometimes at night and was singing inappropriately.  Past Psychiatric History: History listed as recurrent depression but everything about the patient and the note sounds like bipolar disorder instead  Risk to  Self:   Risk to Others:   Prior Inpatient Therapy:   Prior Outpatient Therapy:    Past Medical History:  Past Medical History:  Diagnosis Date   Anxiety    Anxiety    Arthritis    Depression    Depression    DVT (deep venous thrombosis) (HCC)    DVT of leg (deep venous thrombosis) (HCC) 2014   Pulmonary emboli (HCC)    TIA (transient ischemic attack) 2014    Past Surgical History:  Procedure Laterality Date   Blood clot removal     ECTOPIC PREGNANCY SURGERY     HERNIA REPAIR     HERNIA REPAIR  2001   SKIN GRAFT     TUBAL LIGATION     Family History:  Family History  Problem Relation Age of Onset   Healthy Mother    Heart disease Father    Diabetes Father    Asthma Father    Breast cancer Neg Hx    Family Psychiatric  History: See previous Social History:  Social History   Substance and Sexual Activity  Alcohol Use Yes   Alcohol/week: 1.0 standard drink of alcohol   Types: 1 Glasses of wine per week   Comment: occasional     Social History   Substance and Sexual Activity  Drug Use No    Social History   Socioeconomic History   Marital status: Single    Spouse name: Not on file   Number of children: 4   Years of education: Not on file   Highest education level: Not on file  Occupational History  Not on file  Tobacco Use   Smoking status: Every Day    Packs/day: 1.00    Years: 20.00    Total pack years: 20.00    Types: Cigarettes   Smokeless tobacco: Never  Vaping Use   Vaping Use: Never used  Substance and Sexual Activity   Alcohol use: Yes    Alcohol/week: 1.0 standard drink of alcohol    Types: 1 Glasses of wine per week    Comment: occasional   Drug use: No   Sexual activity: Not on file  Other Topics Concern   Not on file  Social History Narrative   Not on file   Social Determinants of Health   Financial Resource Strain: Not on file  Food Insecurity: Not on file  Transportation Needs: Not on file  Physical Activity: Not on file   Stress: Not on file  Social Connections: Not on file   Additional Social History:    Allergies:   Allergies  Allergen Reactions   Sulfa Antibiotics Anaphylaxis    Labs:  Results for orders placed or performed during the hospital encounter of 06/27/21 (from the past 48 hour(s))  HIV Antibody (routine testing w rflx)     Status: None   Collection Time: 06/29/21  3:34 PM  Result Value Ref Range   HIV Screen 4th Generation wRfx Non Reactive Non Reactive    Comment: Performed at [facility], [address], [place], [place] [address]  Respiratory (~20 pathogens) panel by PCR     Status: None   Collection Time: 06/29/21  3:34 PM   Specimen: Nasopharyngeal Swab; Respiratory  Result Value Ref Range   Adenovirus NOT DETECTED NOT DETECTED   Coronavirus 229E NOT DETECTED NOT DETECTED    Comment: (NOTE) The Coronavirus on the Respiratory Panel, DOES NOT test for the novel  Coronavirus (2019 nCoV)    Coronavirus HKU1 NOT DETECTED NOT DETECTED   Coronavirus NL63 NOT DETECTED NOT DETECTED   Coronavirus OC43 NOT DETECTED NOT DETECTED   Metapneumovirus NOT DETECTED NOT DETECTED   Rhinovirus / Enterovirus NOT DETECTED NOT DETECTED   Influenza A NOT DETECTED NOT DETECTED   Influenza B NOT DETECTED NOT DETECTED   Parainfluenza Virus 1 NOT DETECTED NOT DETECTED   Parainfluenza Virus 2 NOT DETECTED NOT DETECTED   Parainfluenza Virus 3 NOT DETECTED NOT DETECTED   Parainfluenza Virus 4 NOT DETECTED NOT DETECTED   Respiratory Syncytial Virus NOT DETECTED NOT DETECTED   Bordetella pertussis NOT DETECTED NOT DETECTED   Bordetella Parapertussis NOT DETECTED NOT DETECTED   Chlamydophila pneumoniae NOT DETECTED NOT DETECTED   Mycoplasma pneumoniae NOT DETECTED NOT DETECTED    Comment: Performed at [facility], [address], [place], [place] [address]  Basic metabolic panel     Status: None   Collection Time: 06/30/21  3:51 AM  Result Value Ref Range   Sodium 140 135 - 145 mmol/L    Potassium 4.1 3.5 - 5.1 mmol/L   Chloride 104 98 - 111 mmol/L   CO2 30 22 - 32 mmol/L   Glucose, Bld 98 70 - 99 mg/dL    Comment: Glucose reference range applies only to samples taken after fasting for at least 8 hours.   BUN 18 6 - 20 mg/dL   Creatinine, Ser 9.79 0.44 - 1.00 mg/dL   Calcium 9.1 8.9 - 89.2 mg/dL   GFR, Estimated >11 >94 mL/min    Comment: (NOTE) Calculated using the CKD-EPI Creatinine Equation (2021)  Anion gap 6 5 - 15    Comment: Performed at [facility], [address] [place], [place] [address]  CBC     Status: None   Collection Time: 06/30/21  3:51 AM  Result Value Ref Range   WBC 9.7 4.0 - 10.5 K/uL   RBC 4.30 3.87 - 5.11 MIL/uL   Hemoglobin 12.6 12.0 - 15.0 g/dL   HCT 26.7 12.4 - 58.0 %   MCV 92.6 80.0 - 100.0 fL   MCH 29.3 26.0 - 34.0 pg   MCHC 31.7 30.0 - 36.0 g/dL   RDW 99.8 33.8 - 25.0 %   Platelets 214 150 - 400 K/uL   nRBC 0.0 0.0 - 0.2 %    Comment: Performed at [facility], [address] [place], [place] [address]    Current Facility-Administered Medications  Medication Dose Route Frequency Provider Last Rate Last Admin   acetaminophen (TYLENOL) tablet 650 mg  650 mg Oral Q6H PRN Lorretta Harp, MD   650 mg at 06/30/21 0910   albuterol (PROVENTIL) (2.5 MG/3ML) 0.083% nebulizer solution 2.5 mg  2.5 mg Nebulization Q4H PRN Lorretta Harp, MD   2.5 mg at 06/30/21 0538   albuterol (VENTOLIN HFA) 108 (90 Base) MCG/ACT inhaler 2 puff  2 puff Inhalation Q4H PRN Nita Sickle, MD   2 puff at 06/29/21 0839   apixaban (ELIQUIS) tablet 5 mg  5 mg Oral BID Merwyn Katos, MD   5 mg at 06/30/21 0910   azithromycin (ZITHROMAX) tablet 250 mg  250 mg Oral Daily Lorretta Harp, MD   250 mg at 06/30/21 7341   dextromethorphan-guaiFENesin (MUCINEX DM) 30-600 MG per 12 hr tablet 1 tablet  1 tablet Oral BID PRN Lorretta Harp, MD       ipratropium-albuterol (DUONEB) 0.5-2.5 (3) MG/3ML nebulizer solution 3 mL  3 mL Nebulization Q4H Lorretta Harp, MD    3 mL at 06/30/21 1157   loratadine (CLARITIN) tablet 10 mg  10 mg Oral Daily Merwyn Katos, MD   10 mg at 06/30/21 0910   LORazepam (ATIVAN) injection 1 mg  1 mg Intravenous Q6H PRN Lorretta Harp, MD   1 mg at 06/29/21 2112   multivitamin with minerals tablet 1 tablet  1 tablet Oral Daily Merwyn Katos, MD   1 tablet at 06/30/21 9379   nicotine (NICODERM CQ - dosed in mg/24 hours) patch 21 mg  21 mg Transdermal Daily Lorretta Harp, MD   21 mg at 06/30/21 0908   ondansetron (ZOFRAN) injection 4 mg  4 mg Intravenous Q8H PRN Lorretta Harp, MD       predniSONE (DELTASONE) tablet 50 mg  50 mg Oral Q breakfast Delton Prairie, MD   50 mg at 06/30/21 0910   risperiDONE (RISPERDAL) tablet 0.5 mg  0.5 mg Oral BID Charm Rings, NP   0.5 mg at 06/30/21 0240   traZODone (DESYREL) tablet 50 mg  50 mg Oral QHS Merwyn Katos, MD   50 mg at 06/29/21 2010    Musculoskeletal: Strength & Muscle Tone: within normal limits Gait & Station: normal Patient leans: N/A            Psychiatric Specialty Exam:  Presentation  General Appearance: Bizarre  Eye Contact:Fair  Speech:Normal Rate  Speech Volume:Normal  Handedness:Right   Mood and Affect  Mood:Anxious; Depressed  Affect:Inappropriate; Full Range   Thought Process  Thought Processes:Disorganized  Descriptions of Associations:Tangential  Orientation:Full (Time, Place and Person)  Thought Content:Illogical; Tangential  History of Schizophrenia/Schizoaffective disorder:No  Duration of Psychotic Symptoms:Greater than six months  Hallucinations:No data recorded Ideas of Reference:No data recorded Suicidal Thoughts:No data recorded Homicidal Thoughts:No data recorded  Sensorium  Memory:Immediate Fair  Judgment:Impaired  Insight:Lacking   Executive Functions  Concentration:Poor  Attention Span:Poor  Recall:Poor  Fund of Knowledge:Poor  Language:Poor   Psychomotor Activity  Psychomotor Activity:No data  recorded  Assets  Assets:Desire for Improvement; Talents/Skills; Housing; Social Support   Sleep  Sleep:No data recorded  Physical Exam: Physical Exam Vitals and nursing note reviewed.  Constitutional:      Appearance: Normal appearance.  HENT:     Head: Normocephalic and atraumatic.     Mouth/Throat:     Pharynx: Oropharynx is clear.  Eyes:     Pupils: Pupils are equal, round, and reactive to light.  Cardiovascular:     Rate and Rhythm: Normal rate and regular rhythm.  Pulmonary:     Effort: Pulmonary effort is normal.     Breath sounds: Normal breath sounds.  Abdominal:     General: Abdomen is flat.     Palpations: Abdomen is soft.  Musculoskeletal:        General: Normal range of motion.  Skin:    General: Skin is warm and dry.  Neurological:     General: No focal deficit present.     Mental Status: She is alert. Mental status is at baseline.  Psychiatric:        Attention and Perception: Attention normal.        Mood and Affect: Mood normal. Affect is labile.        Speech: Speech is tangential.        Behavior: Behavior is cooperative.        Thought Content: Thought content normal.        Cognition and Memory: Memory is impaired.    Review of Systems  Constitutional: Negative.   HENT: Negative.    Eyes: Negative.   Respiratory: Negative.    Cardiovascular: Negative.   Gastrointestinal: Negative.   Musculoskeletal: Negative.   Skin: Negative.   Neurological: Negative.   Psychiatric/Behavioral:  Negative for suicidal ideas. The patient is nervous/anxious.    Blood pressure (!) 96/58, pulse 89, temperature 97.7 F (36.5 C), temperature source Oral, resp. rate 17, height 5\' 8"  (1.727 m), weight 104.3 kg, SpO2 95 %. Body mass index is 34.97 kg/m.  Treatment Plan Summary: Medication management and Plan patient appears to be cooperative with treatment and not suicidal and not dangerous and does not need commitment.  I will not place her under commitment.   Currently there are no commitment papers on the chart and no order in the electronic chart so there is really nothing to discontinue.  Sitter was going to be removed from the room but patient became agitated.  I think they are going to leave it there as a for now.  We can reassess tomorrow to see if she needs to come to psychiatry.  Disposition: Recommend psychiatric Inpatient admission when medically cleared.  Recruitment consultant, MD 06/30/2021 3:28 PM

## 2021-06-30 NOTE — Progress Notes (Addendum)
[facility] Progress Note  Patient: Anna Adkins    LPF:[id number]  DOA: 06/27/2021    Date of Service: the patient was seen and examined on 06/30/2021  Brief hospital course: 56 year old female with past medical history of PE on Eliquis, TIA, depression and anxiety, tobacco abuse and obesity who presented to the emergency room on 6/17 for voluntary commitment.  She denies any suicidal or homicidal ideations.  She was noted at times however to have rambling speech.  It was recommended that patient undergo inpatient psychiatric admission.  On 6/19, patient noted to have oxygen desaturation of 81% on room air as well as a dry cough and some mild dyspnea on exertion.  Work-up unrevealing and patient felt to have a possible COPD exacerbation.  She was admitted to the hospitalist service and started on antibiotics, oxygen and nebulizers.  Assessment and Plan: Assessment and Plan: * COPD exacerbation (HCC) Patient has oxygen desaturation to 81% on room air, has 2 liters of new oxygen requirement.  She has cough and mild shortness breath, but no acute respiratory distress.  Clinically does not have acute respiratory failure with hypoxia.  Patient does not carry diagnosis of COPD, but she is a smoker, and has wheezing on auscultation, indicating possible undiagnosed COPD with acute exacerbation.  CTA negative for PE.  COVID PCR negative.  Currently patient has good oxygen saturations on room air.  Her lung exam is clear.  We will check ambulatory pulse ox in the morning.  Check procalcitonin to see if antibiotics need to be continued.  Severe episode of recurrent major depressive disorder, with psychotic features [facility]) Psychiatry following.  Initially, patient was involuntarily committed in the emergency room.  She is calm currently denies any suicidal or homicidal ideations. -Patient was given 2 mg of Ativan, Geodon, asenapine, Benadryl in ED -Continue risperidone, trazodone Based off of  psychiatry's note on 6/20, patient does not appear to be suicidal, not dangerous and does not need commitment and she appears to be cooperative with treatment.  Currently the plan is to not place her in under commitment.  For now, leave safety sitter in place. Monitor overnight and plan is to reassess from a psychiatric standpoint on 6/21.    Hypotension Patient's blood pressure has ranged from the 120s systolic to high 53G.  This is likely in part due to her psychiatry and agitation medications.  I have ordered a fluid bolus.  Recheck blood pressure in the morning.  Acute respiratory failure with hypoxia (HCC)-resolved as of 06/30/2021 Secondary COPD exacerbation.  Looks to have mostly resolved.  Continue steroids.  Off of oxygen at this point.  Pulmonary embolism (HCC) - Continue Eliquis  DVT (deep venous thrombosis) (HCC) Hx of DVT and PE: -Continue Eliquis  Tobacco abuse - Nicotine patch -Did counseling about importance of quitting tobacco abuse  Obesity (BMI 30-39.9) Meets criteria BMI greater than 30        Body mass index is 34.97 kg/m.        Consultants: Psychiatry  Procedures: None  Antimicrobials: IV Rocephin and Zithromax 6/19-present  Code Status: Full code   Subjective: Patient resting comfortably, denies any shortness of breath  Objective: Noted hypotension Vitals:   06/30/21 1126 06/30/21 1541  BP: (!) 96/58 (!) 89/60  Pulse: 89 84  Resp: 17 17  Temp: 97.7 F (36.5 C) 98.3 F (36.8 C)  SpO2: 95% 92%    Intake/Output Summary (Last 24 hours) at 06/30/2021 1558 Last data filed at 06/30/2021 1400 Gross  per 24 hour  Intake 960 ml  Output --  Net 960 ml   Filed Weights   06/27/21 2218  Weight: 104.3 kg   Body mass index is 34.97 kg/m.  Exam:  General: Alert and oriented x3, no acute distress HEENT: Normocephalic, atraumatic, mucous membranes are moist Cardiovascular: Regular rate and rhythm, S1-S2 Respiratory: Clear to  auscultation bilaterally except for minimal left upper lobe end expiratory wheeze Abdomen: Soft, nontender, nondistended, positive bowel sounds Musculoskeletal: No clubbing or cyanosis or edema Skin: No skin breaks, tears or lesions Psychiatry: Slightly flattened affect Neurology: No focal deficits  Data Reviewed: Basic metabolic panel from 6/20 unremarkable.  Procalcitonin pending  Disposition:  Status is: Inpatient Remains inpatient appropriate because:  -Follow-up by psychiatry -Improvement in hypotension    Anticipated discharge date:  -Resolution of hypotension -Voluntary bed availability at inpatient psychiatry  Family Communication: None DVT Prophylaxis:  apixaban (ELIQUIS) tablet 5 mg    Author: Hollice Espy ,MD 06/30/2021 3:58 PM  To reach On-call, see care teams to locate the attending and reach out via [email or web address]. Between 7PM-7AM, please contact night-coverage If you still have difficulty reaching the attending provider, please page the [facility] (Director on Call) for [facility] on amion for assistance.

## 2021-06-30 NOTE — Plan of Care (Signed)
  Problem: Education: Goal: Knowledge of General Education information will improve Description: Including pain rating scale, medication(s)/side effects and non-pharmacologic comfort measures Outcome: Progressing   Problem: Clinical Measurements: Goal: Ability to maintain clinical measurements within normal limits will improve Outcome: Progressing Goal: Will remain free from infection Outcome: Progressing Goal: Diagnostic test results will improve Outcome: Progressing Goal: Respiratory complications will improve Outcome: Progressing Goal: Cardiovascular complication will be avoided Outcome: Progressing   Problem: Activity: Goal: Risk for activity intolerance will decrease Outcome: Progressing   Problem: Nutrition: Goal: Adequate nutrition will be maintained Outcome: Progressing   Problem: Coping: Goal: Level of anxiety will decrease Outcome: Progressing   Problem: Elimination: Goal: Will not experience complications related to bowel motility Outcome: Progressing Goal: Will not experience complications related to urinary retention Outcome: Progressing   Problem: Pain Managment: Goal: General experience of comfort will improve Outcome: Progressing   Problem: Safety: Goal: Ability to remain free from injury will improve Outcome: Progressing   Problem: Skin Integrity: Goal: Risk for impaired skin integrity will decrease Outcome: Progressing   Problem: Education: Goal: Knowledge of disease or condition will improve Outcome: Progressing Goal: Knowledge of the prescribed therapeutic regimen will improve Outcome: Progressing Goal: Individualized Educational Video(s) Outcome: Progressing   Problem: Activity: Goal: Ability to tolerate increased activity will improve Outcome: Progressing Goal: Will verbalize the importance of balancing activity with adequate rest periods Outcome: Progressing   Problem: Respiratory: Goal: Ability to maintain a clear airway will  improve Outcome: Progressing Goal: Levels of oxygenation will improve Outcome: Progressing Goal: Ability to maintain adequate ventilation will improve Outcome: Progressing   Problem: Education: Goal: Knowledge of General Education information will improve Description: Including pain rating scale, medication(s)/side effects and non-pharmacologic comfort measures Outcome: Progressing   Problem: Health Behavior/Discharge Planning: Goal: Ability to manage health-related needs will improve Outcome: Progressing   Problem: Clinical Measurements: Goal: Ability to maintain clinical measurements within normal limits will improve Outcome: Progressing Goal: Will remain free from infection Outcome: Progressing Goal: Diagnostic test results will improve Outcome: Progressing Goal: Respiratory complications will improve Outcome: Progressing Goal: Cardiovascular complication will be avoided Outcome: Progressing   Problem: Activity: Goal: Risk for activity intolerance will decrease Outcome: Progressing   Problem: Nutrition: Goal: Adequate nutrition will be maintained Outcome: Progressing   Problem: Coping: Goal: Level of anxiety will decrease Outcome: Progressing   Problem: Elimination: Goal: Will not experience complications related to bowel motility Outcome: Progressing Goal: Will not experience complications related to urinary retention Outcome: Progressing   Problem: Pain Managment: Goal: General experience of comfort will improve Outcome: Progressing   Problem: Safety: Goal: Ability to remain free from injury will improve Outcome: Progressing   Problem: Skin Integrity: Goal: Risk for impaired skin integrity will decrease Outcome: Progressing

## 2021-07-01 DIAGNOSIS — J441 Chronic obstructive pulmonary disease with (acute) exacerbation: Secondary | ICD-10-CM | POA: Diagnosis not present

## 2021-07-01 MED ORDER — POLYETHYLENE GLYCOL 3350 17 G PO PACK
17.0000 g | PACK | Freq: Every day | ORAL | Status: DC
  Administered 2021-07-01: 17 g via ORAL
  Filled 2021-07-01 (×2): qty 1

## 2021-07-01 MED ORDER — IPRATROPIUM-ALBUTEROL 0.5-2.5 (3) MG/3ML IN SOLN
3.0000 mL | Freq: Two times a day (BID) | RESPIRATORY_TRACT | Status: DC
  Administered 2021-07-02: 3 mL via RESPIRATORY_TRACT
  Filled 2021-07-01 (×3): qty 3

## 2021-07-01 MED ORDER — SENNOSIDES-DOCUSATE SODIUM 8.6-50 MG PO TABS
1.0000 | ORAL_TABLET | Freq: Two times a day (BID) | ORAL | Status: DC
  Administered 2021-07-01 – 2021-07-02 (×3): 1 via ORAL
  Filled 2021-07-01 (×3): qty 1

## 2021-07-01 NOTE — Progress Notes (Signed)
PROGRESS NOTE    Anna Adkins  ZDG:[id number] DOB: 1965/04/18 DOA: 06/27/2021 PCP: Delman Cheadle, PA    Brief Narrative:  56 year old female with past medical history of PE on Eliquis, TIA, depression and anxiety, tobacco abuse and obesity who presented to the emergency room on 6/17 for voluntary commitment.  She denies any suicidal or homicidal ideations.  She was noted at times however to have rambling speech.  It was recommended that patient undergo inpatient psychiatric admission.  On 6/19, patient noted to have oxygen desaturation of 81% on room air as well as a dry cough and some mild dyspnea on exertion.  Work-up unrevealing and patient felt to have a possible COPD exacerbation.  She was admitted to the hospitalist service and started on antibiotics, oxygen and nebulizers.   6/23  sitter at bedside.   Consultants:  psychiatry  Procedures:   Antimicrobials:      Subjective: Has no new complaints. Feels breathing is better. No cp.   Objective: Vitals:   07/01/21 0325 07/01/21 0500 07/01/21 0713 07/01/21 1121  BP: (!) 143/76  116/63 109/62  Pulse: 91  72 75  Resp: 18  17 17   Temp: 98.7 F (37.1 C)  97.8 F (36.6 C) 97.8 F (36.6 C)  TempSrc:   Oral Oral  SpO2: 90% 92% 94% 94%  Weight:      Height:        Intake/Output Summary (Last 24 hours) at 07/01/2021 1553 Last data filed at 07/01/2021 1500 Gross per 24 hour  Intake 2281.32 ml  Output --  Net 2281.32 ml   Filed Weights   06/27/21 2218  Weight: 104.3 kg    Examination:  Calm, NAD Cta no w/r Reg s1/s2 no gallop Soft benign +bs No edema Aaoxox3  Mood and affect appropriate in current setting     Data Reviewed: I have personally reviewed following labs and imaging studies  CBC: Recent Labs  Lab 06/27/21 2221 06/30/21 0351  WBC 8.6 9.7  HGB 15.0 12.6  HCT 47.0* 39.8  MCV 91.4 92.6  PLT 272 214   Basic Metabolic Panel: Recent Labs  Lab 06/27/21 2221 06/30/21 0351  NA  141 140  K 3.8 4.1  CL 105 104  CO2 26 30  GLUCOSE 120* 98  BUN 12 18  CREATININE 0.84 0.64  CALCIUM 9.6 9.1   GFR: Estimated Creatinine Clearance: 98.1 mL/min (by C-G formula based on SCr of 0.64 mg/dL). Liver Function Tests: Recent Labs  Lab 06/27/21 2221  AST 18  ALT 15  ALKPHOS 60  BILITOT 0.8  PROT 8.4*  ALBUMIN 4.3   No results for input(s): "LIPASE", "AMYLASE" in the last 168 hours. No results for input(s): "AMMONIA" in the last 168 hours. Coagulation Profile: No results for input(s): "INR", "PROTIME" in the last 168 hours. Cardiac Enzymes: No results for input(s): "CKTOTAL", "CKMB", "CKMBINDEX", "TROPONINI" in the last 168 hours. BNP (last 3 results) No results for input(s): "PROBNP" in the last 8760 hours. HbA1C: No results for input(s): "HGBA1C" in the last 72 hours. CBG: No results for input(s): "GLUCAP" in the last 168 hours. Lipid Profile: No results for input(s): "CHOL", "HDL", "LDLCALC", "TRIG", "CHOLHDL", "LDLDIRECT" in the last 72 hours. Thyroid Function Tests: No results for input(s): "TSH", "T4TOTAL", "FREET4", "T3FREE", "THYROIDAB" in the last 72 hours. Anemia Panel: No results for input(s): "VITAMINB12", "FOLATE", "FERRITIN", "TIBC", "IRON", "RETICCTPCT" in the last 72 hours. Sepsis Labs: Recent Labs  Lab 06/30/21 0351  PROCALCITON <0.10  Recent Results (from the past 240 hour(s))  SARS Coronavirus 2 by RT PCR (hospital order, performed in [facility] hospital lab) *cepheid single result test* Anterior Nasal Swab     Status: None   Collection Time: 06/28/21  1:11 AM   Specimen: Anterior Nasal Swab  Result Value Ref Range Status   SARS Coronavirus 2 by RT PCR NEGATIVE NEGATIVE Final    Comment: (NOTE) SARS-CoV-2 target nucleic acids are NOT DETECTED.  The SARS-CoV-2 RNA is generally detectable in upper and lower respiratory specimens during the acute phase of infection. The lowest concentration of SARS-CoV-2 viral copies this assay can  detect is 250 copies / mL. A negative result does not preclude SARS-CoV-2 infection and should not be used as the sole basis for treatment or other patient management decisions.  A negative result may occur with improper specimen collection / handling, submission of specimen other than nasopharyngeal swab, presence of viral mutation(s) within the areas targeted by this assay, and inadequate number of viral copies (<250 copies / mL). A negative result must be combined with clinical observations, patient history, and epidemiological information.  Fact Sheet for Patients:   RoadLapTop.co.za  Fact Sheet for Healthcare Providers: http://kim-miller.com/  This test is not yet approved or  cleared by the [place] FDA and has been authorized for detection and/or diagnosis of SARS-CoV-2 by FDA under an Emergency Use Authorization (EUA).  This EUA will remain in effect (meaning this test can be used) for the duration of the COVID-19 declaration under Section 564(b)(1) of the Act, 21 U.S.C. section 360bbb-3(b)(1), unless the authorization is terminated or revoked sooner.  Performed at [facility], [address] [place], [place] [address]   Resp Panel by RT-PCR (Flu A&B, Covid) Anterior Nasal Swab     Status: None   Collection Time: 06/28/21  1:11 AM   Specimen: Anterior Nasal Swab  Result Value Ref Range Status   SARS Coronavirus 2 by RT PCR NEGATIVE NEGATIVE Final    Comment: (NOTE) SARS-CoV-2 target nucleic acids are NOT DETECTED.  The SARS-CoV-2 RNA is generally detectable in upper respiratory specimens during the acute phase of infection. The lowest concentration of SARS-CoV-2 viral copies this assay can detect is 138 copies/mL. A negative result does not preclude SARS-Cov-2 infection and should not be used as the sole basis for treatment or other patient management decisions. A negative result may occur with  improper  specimen collection/handling, submission of specimen other than nasopharyngeal swab, presence of viral mutation(s) within the areas targeted by this assay, and inadequate number of viral copies(<138 copies/mL). A negative result must be combined with clinical observations, patient history, and epidemiological information. The expected result is Negative.  Fact Sheet for Patients:  BloggerCourse.com  Fact Sheet for Healthcare Providers:  SeriousBroker.it  This test is no t yet approved or cleared by the [place] FDA and  has been authorized for detection and/or diagnosis of SARS-CoV-2 by FDA under an Emergency Use Authorization (EUA). This EUA will remain  in effect (meaning this test can be used) for the duration of the COVID-19 declaration under Section 564(b)(1) of the Act, 21 U.S.C.section 360bbb-3(b)(1), unless the authorization is terminated  or revoked sooner.       Influenza A by PCR NEGATIVE NEGATIVE Final   Influenza B by PCR NEGATIVE NEGATIVE Final    Comment: (NOTE) The Xpert Xpress SARS-CoV-2/FLU/RSV plus assay is intended as an aid in the diagnosis of influenza from Nasopharyngeal swab specimens and should not be  used as a sole basis for treatment. Nasal washings and aspirates are unacceptable for Xpert Xpress SARS-CoV-2/FLU/RSV testing.  Fact Sheet for Patients: BloggerCourse.com  Fact Sheet for Healthcare Providers: SeriousBroker.it  This test is not yet approved or cleared by the [place] FDA and has been authorized for detection and/or diagnosis of SARS-CoV-2 by FDA under an Emergency Use Authorization (EUA). This EUA will remain in effect (meaning this test can be used) for the duration of the COVID-19 declaration under Section 564(b)(1) of the Act, 21 U.S.C. section 360bbb-3(b)(1), unless the authorization is terminated or revoked.  Performed at  [facility], [address] [place], [place] [address]   Respiratory (~20 pathogens) panel by PCR     Status: None   Collection Time: 06/29/21  3:34 PM   Specimen: Nasopharyngeal Swab; Respiratory  Result Value Ref Range Status   Adenovirus NOT DETECTED NOT DETECTED Final   Coronavirus 229E NOT DETECTED NOT DETECTED Final    Comment: (NOTE) The Coronavirus on the Respiratory Panel, DOES NOT test for the novel  Coronavirus (2019 nCoV)    Coronavirus HKU1 NOT DETECTED NOT DETECTED Final   Coronavirus NL63 NOT DETECTED NOT DETECTED Final   Coronavirus OC43 NOT DETECTED NOT DETECTED Final   Metapneumovirus NOT DETECTED NOT DETECTED Final   Rhinovirus / Enterovirus NOT DETECTED NOT DETECTED Final   Influenza A NOT DETECTED NOT DETECTED Final   Influenza B NOT DETECTED NOT DETECTED Final   Parainfluenza Virus 1 NOT DETECTED NOT DETECTED Final   Parainfluenza Virus 2 NOT DETECTED NOT DETECTED Final   Parainfluenza Virus 3 NOT DETECTED NOT DETECTED Final   Parainfluenza Virus 4 NOT DETECTED NOT DETECTED Final   Respiratory Syncytial Virus NOT DETECTED NOT DETECTED Final   Bordetella pertussis NOT DETECTED NOT DETECTED Final   Bordetella Parapertussis NOT DETECTED NOT DETECTED Final   Chlamydophila pneumoniae NOT DETECTED NOT DETECTED Final   Mycoplasma pneumoniae NOT DETECTED NOT DETECTED Final    Comment: Performed at [facility], [address] [place], [place] [address]         Radiology Studies: No results found.      Scheduled Meds:  apixaban  5 mg Oral BID   azithromycin  250 mg Oral Daily   ipratropium-albuterol  3 mL Nebulization BID   loratadine  10 mg Oral Daily   multivitamin with minerals  1 tablet Oral Daily   nicotine  21 mg Transdermal Daily   polyethylene glycol  17 g Oral Daily   predniSONE  50 mg Oral Q breakfast   risperiDONE  1 mg Oral BID   senna-docusate  1 tablet Oral BID   traZODone  50 mg Oral QHS   Continuous  Infusions:  Assessment & Plan:   Principal Problem:   COPD exacerbation (HCC) Active Problems:   Severe episode of recurrent major depressive disorder, with psychotic features (HCC)   Hypotension   Pulmonary embolism (HCC)   DVT (deep venous thrombosis) (HCC)   Tobacco abuse   Obesity (BMI 30-39.9)   COPD exacerbation (HCC) Patient has oxygen desaturation to 81% on room air, has 2 liters of new oxygen requirement.  She has cough and mild shortness breath, but no acute respiratory distress.  Clinically does not have acute respiratory failure with hypoxia.  Patient does not carry diagnosis of COPD, but she is a smoker, and has wheezing on auscultation, indicating possible undiagnosed COPD with acute exacerbation.  CTA negative for PE.  COVID PCR negative. 6/21 currently  on RA.  Will ck ambulatory 02- requested from nsg Procalcitonin nml Continue azithromycin      Severe episode of recurrent major depressive disorder, with psychotic features [facility]) Psychiatry following.  Initially, patient was involuntarily committed in the emergency room.  She is calm currently denies any suicidal or homicidal ideations. -Patient was given 2 mg of Ativan, Geodon, asenapine, Benadryl in ED -Continue risperidone, trazodone Based off of psychiatry's note on 6/20, patient does not appear to be suicidal, not dangerous and does not need commitment and she appears to be cooperative with treatment.  Currently the plan is to not place her in under commitment.  For now, leave safety sitter in place. 6/21 psychiatry reassessing pending.   Hypotension Patient's blood pressure has ranged from the 120s systolic to high 36U.  This is likely in part due to her psychiatry and agitation medications.  6/21 was given fluid bolus BP now stable     Acute respiratory failure with hypoxia (HCC)- resolved as of 06/30/2021 Secondary COPD exacerbation.  Looks to have mostly resolved.  6/21 continue steroid taper      Pulmonary embolism (HCC) - Continue Eliquis   DVT (deep venous thrombosis) (HCC) Hx of DVT and PE: -Continue Eliquis   Tobacco abuse - Nicotine patch -Did counseling about importance of quitting tobacco abuse   Obesity (BMI 30-39.9) Meets criteria BMI greater than 30                                                                                                                                                                                                     Body mass index is 34.97 kg/m.         DVT prophylaxis: Eliquis Code Status: Full Family Communication: None at bedside Disposition Plan: Will be returning to psychiatry floor once reassessed Status is: Inpatient Remains inpatient appropriate because: unsafe discharge, needs to be reassessed by psych first.        LOS: 2 days   Time spent: 35 min    Lynn Ito, MD [facility] Pager [phone]  If 7PM-7AM, please contact night-coverage 07/01/2021, 3:53 PM

## 2021-07-01 NOTE — Plan of Care (Signed)
  Problem: Education: Goal: Knowledge of General Education information will improve Description: Including pain rating scale, medication(s)/side effects and non-pharmacologic comfort measures Outcome: Progressing   Problem: Health Behavior/Discharge Planning: Goal: Ability to manage health-related needs will improve Outcome: Progressing   Problem: Clinical Measurements: Goal: Ability to maintain clinical measurements within normal limits will improve Outcome: Progressing Goal: Will remain free from infection Outcome: Progressing Goal: Diagnostic test results will improve Outcome: Progressing Goal: Respiratory complications will improve Outcome: Progressing Goal: Cardiovascular complication will be avoided Outcome: Progressing   Problem: Activity: Goal: Risk for activity intolerance will decrease Outcome: Progressing   Problem: Nutrition: Goal: Adequate nutrition will be maintained Outcome: Progressing   Problem: Coping: Goal: Level of anxiety will decrease Outcome: Progressing   Problem: Elimination: Goal: Will not experience complications related to bowel motility Outcome: Progressing Goal: Will not experience complications related to urinary retention Outcome: Progressing   Problem: Pain Managment: Goal: General experience of comfort will improve Outcome: Progressing   Problem: Safety: Goal: Ability to remain free from injury will improve Outcome: Progressing   Problem: Skin Integrity: Goal: Risk for impaired skin integrity will decrease Outcome: Progressing   Problem: Education: Goal: Knowledge of disease or condition will improve Outcome: Progressing Goal: Knowledge of the prescribed therapeutic regimen will improve Outcome: Progressing Goal: Individualized Educational Video(s) Outcome: Progressing   Problem: Activity: Goal: Ability to tolerate increased activity will improve Outcome: Progressing Goal: Will verbalize the importance of balancing  activity with adequate rest periods Outcome: Progressing   Problem: Respiratory: Goal: Ability to maintain a clear airway will improve Outcome: Progressing Goal: Levels of oxygenation will improve Outcome: Progressing Goal: Ability to maintain adequate ventilation will improve Outcome: Progressing   Problem: Education: Goal: Knowledge of General Education information will improve Description: Including pain rating scale, medication(s)/side effects and non-pharmacologic comfort measures Outcome: Progressing   Problem: Health Behavior/Discharge Planning: Goal: Ability to manage health-related needs will improve Outcome: Progressing   Problem: Clinical Measurements: Goal: Ability to maintain clinical measurements within normal limits will improve Outcome: Progressing Goal: Will remain free from infection Outcome: Progressing Goal: Diagnostic test results will improve Outcome: Progressing Goal: Respiratory complications will improve Outcome: Progressing Goal: Cardiovascular complication will be avoided Outcome: Progressing   Problem: Activity: Goal: Risk for activity intolerance will decrease Outcome: Progressing   Problem: Nutrition: Goal: Adequate nutrition will be maintained Outcome: Progressing   Problem: Coping: Goal: Level of anxiety will decrease Outcome: Progressing   Problem: Elimination: Goal: Will not experience complications related to bowel motility Outcome: Progressing Goal: Will not experience complications related to urinary retention Outcome: Progressing   Problem: Pain Managment: Goal: General experience of comfort will improve Outcome: Progressing   Problem: Safety: Goal: Ability to remain free from injury will improve Outcome: Progressing   Problem: Skin Integrity: Goal: Risk for impaired skin integrity will decrease Outcome: Progressing   

## 2021-07-01 NOTE — Plan of Care (Signed)
  Problem: Education: Goal: Knowledge of General Education information will improve Description: Including pain rating scale, medication(s)/side effects and non-pharmacologic comfort measures Outcome: Progressing   Problem: Health Behavior/Discharge Planning: Goal: Ability to manage health-related needs will improve Outcome: Progressing   Problem: Clinical Measurements: Goal: Ability to maintain clinical measurements within normal limits will improve Outcome: Progressing Goal: Will remain free from infection Outcome: Progressing Goal: Diagnostic test results will improve Outcome: Progressing Goal: Respiratory complications will improve Outcome: Progressing Goal: Cardiovascular complication will be avoided Outcome: Progressing   Problem: Activity: Goal: Risk for activity intolerance will decrease Outcome: Progressing   Problem: Nutrition: Goal: Adequate nutrition will be maintained Outcome: Progressing   Problem: Coping: Goal: Level of anxiety will decrease Outcome: Progressing   Problem: Elimination: Goal: Will not experience complications related to bowel motility Outcome: Progressing Goal: Will not experience complications related to urinary retention Outcome: Progressing   Problem: Pain Managment: Goal: General experience of comfort will improve Outcome: Progressing   Problem: Safety: Goal: Ability to remain free from injury will improve Outcome: Progressing   Problem: Skin Integrity: Goal: Risk for impaired skin integrity will decrease Outcome: Progressing   Problem: Education: Goal: Knowledge of disease or condition will improve Outcome: Progressing Goal: Knowledge of the prescribed therapeutic regimen will improve Outcome: Progressing Goal: Individualized Educational Video(s) Outcome: Progressing   Problem: Activity: Goal: Ability to tolerate increased activity will improve Outcome: Progressing Goal: Will verbalize the importance of balancing  activity with adequate rest periods Outcome: Progressing   Problem: Respiratory: Goal: Ability to maintain a clear airway will improve Outcome: Progressing Goal: Levels of oxygenation will improve Outcome: Progressing Goal: Ability to maintain adequate ventilation will improve Outcome: Progressing   Problem: Education: Goal: Knowledge of General Education information will improve Description: Including pain rating scale, medication(s)/side effects and non-pharmacologic comfort measures Outcome: Progressing   Problem: Health Behavior/Discharge Planning: Goal: Ability to manage health-related needs will improve Outcome: Progressing   Problem: Clinical Measurements: Goal: Ability to maintain clinical measurements within normal limits will improve Outcome: Progressing Goal: Will remain free from infection Outcome: Progressing Goal: Diagnostic test results will improve Outcome: Progressing Goal: Respiratory complications will improve Outcome: Progressing Goal: Cardiovascular complication will be avoided Outcome: Progressing   Problem: Activity: Goal: Risk for activity intolerance will decrease Outcome: Progressing   Problem: Nutrition: Goal: Adequate nutrition will be maintained Outcome: Progressing   Problem: Coping: Goal: Level of anxiety will decrease Outcome: Progressing   Problem: Elimination: Goal: Will not experience complications related to bowel motility Outcome: Progressing Goal: Will not experience complications related to urinary retention Outcome: Progressing   Problem: Pain Managment: Goal: General experience of comfort will improve Outcome: Progressing   Problem: Safety: Goal: Ability to remain free from injury will improve Outcome: Progressing   Problem: Skin Integrity: Goal: Risk for impaired skin integrity will decrease Outcome: Progressing

## 2021-07-01 NOTE — TOC Initial Note (Signed)
Transition of Care [facility]) - Initial/Assessment Note    Patient Details  Name: Anna Adkins MRN: [id number] Date of Birth: [birth year]  Transition of [facility]) CM/SW Contact:    Marlowe Sax, RN Phone Number: 07/01/2021, 10:16 AM  Clinical Narrative:                 Psychiatry to reassess today to see if she needs to go to Inpatient, Not under IVC        Patient Goals and CMS Choice        Expected Discharge Plan and Services                                                Prior Living Arrangements/Services                       Activities of Daily Living Home Assistive Devices/Equipment: Shower chair with back, Cane (specify quad or straight), Walker (specify type) ADL Screening (condition at time of admission) Patient's cognitive ability adequate to safely complete daily activities?: No Is the patient deaf or have difficulty hearing?: Yes Does the patient have difficulty seeing, even when wearing glasses/contacts?: No Does the patient have difficulty concentrating, remembering, or making decisions?: Yes Patient able to express need for assistance with ADLs?: Yes Does the patient have difficulty dressing or bathing?: Yes Independently performs ADLs?: Yes (appropriate for developmental age) Does the patient have difficulty walking or climbing stairs?: Yes Weakness of Legs: Both Weakness of Arms/Hands: Both  Permission Sought/Granted                  Emotional Assessment              Admission diagnosis:  Hypoxic [R09.02] COPD exacerbation (HCC) [J44.1] Severe episode of recurrent major depressive disorder, with psychotic features [facility]) [F33.3] Patient Active Problem List   Diagnosis Date Noted   Hypotension 06/30/2021   COPD exacerbation (HCC) 06/29/2021   DVT (deep venous thrombosis) (HCC)    Pulmonary embolism (HCC)    TIA (transient ischemic attack)    Depression with anxiety    Tobacco abuse    Obesity (BMI 30-39.9)     Severe episode of recurrent major depressive disorder, with psychotic features (HCC) 05/16/2020   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 [place] [address] Phone: [phone] Fax: [phone]  [facility] - [place], [place] - [address] [place] [address] Phone: [phone] Fax: [phone]     Social Determinants of Health (SDOH) Interventions    Readmission Risk Interventions     No data to display

## 2021-07-01 NOTE — Progress Notes (Signed)
SATURATION QUALIFICATIONS: (This note is used to comply with regulatory documentation for home oxygen) ° °Patient Saturations on Room Air at Rest = 94% ° °Patient Saturations on Room Air while Ambulating = 94% ° °

## 2021-07-01 NOTE — Progress Notes (Signed)
Noted expiratory wheezing, requested for breathing treatment to RT.

## 2021-07-02 ENCOUNTER — Inpatient Hospital Stay
Admission: AD | Admit: 2021-07-02 | Discharge: 2021-07-09 | DRG: 885 | Disposition: A | Discharge status: Home / Self Care | Payer: Medicare Other | Source: Intra-hospital | Attending: Psychiatry | Admitting: Psychiatry

## 2021-07-02 ENCOUNTER — Other Ambulatory Visit: Payer: Self-pay

## 2021-07-02 ENCOUNTER — Encounter: Payer: Self-pay | Admitting: Psychiatry

## 2021-07-02 DIAGNOSIS — Z86718 Personal history of other venous thrombosis and embolism: Secondary | ICD-10-CM

## 2021-07-02 DIAGNOSIS — F333 Major depressive disorder, recurrent, severe with psychotic symptoms: Principal | ICD-10-CM | POA: Diagnosis present

## 2021-07-02 DIAGNOSIS — M79604 Pain in right leg: Secondary | ICD-10-CM | POA: Diagnosis present

## 2021-07-02 DIAGNOSIS — Z8673 Personal history of transient ischemic attack (TIA), and cerebral infarction without residual deficits: Secondary | ICD-10-CM

## 2021-07-02 DIAGNOSIS — Z79899 Other long term (current) drug therapy: Secondary | ICD-10-CM

## 2021-07-02 DIAGNOSIS — J449 Chronic obstructive pulmonary disease, unspecified: Secondary | ICD-10-CM | POA: Diagnosis present

## 2021-07-02 DIAGNOSIS — G4700 Insomnia, unspecified: Secondary | ICD-10-CM | POA: Diagnosis present

## 2021-07-02 DIAGNOSIS — F17210 Nicotine dependence, cigarettes, uncomplicated: Secondary | ICD-10-CM | POA: Diagnosis present

## 2021-07-02 DIAGNOSIS — F411 Generalized anxiety disorder: Secondary | ICD-10-CM | POA: Diagnosis present

## 2021-07-02 DIAGNOSIS — Z7901 Long term (current) use of anticoagulants: Secondary | ICD-10-CM | POA: Diagnosis not present

## 2021-07-02 DIAGNOSIS — F410 Panic disorder [episodic paroxysmal anxiety] without agoraphobia: Secondary | ICD-10-CM | POA: Diagnosis present

## 2021-07-02 DIAGNOSIS — Z86711 Personal history of pulmonary embolism: Secondary | ICD-10-CM

## 2021-07-02 DIAGNOSIS — R45851 Suicidal ideations: Secondary | ICD-10-CM | POA: Diagnosis present

## 2021-07-02 DIAGNOSIS — J441 Chronic obstructive pulmonary disease with (acute) exacerbation: Secondary | ICD-10-CM | POA: Diagnosis not present

## 2021-07-02 DIAGNOSIS — M7989 Other specified soft tissue disorders: Secondary | ICD-10-CM | POA: Diagnosis present

## 2021-07-02 DIAGNOSIS — Z20822 Contact with and (suspected) exposure to covid-19: Secondary | ICD-10-CM | POA: Diagnosis present

## 2021-07-02 LAB — SARS CORONAVIRUS 2 BY RT PCR: SARS Coronavirus 2 by RT PCR: NEGATIVE

## 2021-07-02 MED ORDER — POLYETHYLENE GLYCOL 3350 17 G PO PACK
17.0000 g | PACK | Freq: Every day | ORAL | Status: DC
Start: 2021-07-03 — End: 2021-07-09
  Administered 2021-07-05 – 2021-07-09 (×5): 17 g via ORAL
  Filled 2021-07-02 (×7): qty 1

## 2021-07-02 MED ORDER — ESCITALOPRAM OXALATE 10 MG PO TABS
20.0000 mg | ORAL_TABLET | Freq: Every day | ORAL | Status: DC
Start: 2021-07-02 — End: 2021-07-09
  Administered 2021-07-02 – 2021-07-08 (×7): 20 mg via ORAL
  Filled 2021-07-02 (×7): qty 2

## 2021-07-02 MED ORDER — ESCITALOPRAM OXALATE 10 MG PO TABS
20.0000 mg | ORAL_TABLET | Freq: Every day | ORAL | Status: DC
Start: 2021-07-02 — End: 2021-07-02

## 2021-07-02 MED ORDER — PREDNISONE 20 MG PO TABS: 40.0000 mg | ORAL_TABLET | Freq: Every day | ORAL | Status: DC

## 2021-07-02 MED ORDER — DM-GUAIFENESIN ER 30-600 MG PO TB12: 1.0000 | ORAL_TABLET | Freq: Two times a day (BID) | ORAL | Status: DC | PRN

## 2021-07-02 MED ORDER — RISPERIDONE 1 MG PO TABS: 1.0000 mg | ORAL_TABLET | Freq: Two times a day (BID) | ORAL | Status: DC

## 2021-07-02 MED ORDER — APIXABAN 5 MG PO TABS
5.0000 mg | ORAL_TABLET | Freq: Two times a day (BID) | ORAL | Status: DC
  Administered 2021-07-02 – 2021-07-09 (×14): 5 mg via ORAL
  Filled 2021-07-02 (×15): qty 1

## 2021-07-02 MED ORDER — MAGNESIUM HYDROXIDE 400 MG/5ML PO SUSP: 30.0000 mL | Freq: Every day | ORAL | Status: DC | PRN

## 2021-07-02 MED ORDER — PREDNISONE 20 MG PO TABS
50.0000 mg | ORAL_TABLET | Freq: Every day | ORAL | Status: AC
Start: 2021-07-03 — End: 2021-07-03
  Administered 2021-07-03: 50 mg via ORAL
  Filled 2021-07-02: qty 3

## 2021-07-02 MED ORDER — ALUM & MAG HYDROXIDE-SIMETH 200-200-20 MG/5ML PO SUSP
30.0000 mL | ORAL | Status: DC | PRN
  Administered 2021-07-03 – 2021-07-04 (×2): 30 mL via ORAL
  Filled 2021-07-02 (×2): qty 30

## 2021-07-02 MED ORDER — NICOTINE 21 MG/24HR TD PT24
21.0000 mg | MEDICATED_PATCH | Freq: Every day | TRANSDERMAL | 0 refills | Status: DC
Start: 2021-07-03 — End: 2021-07-09

## 2021-07-02 MED ORDER — ONDANSETRON HCL 4 MG/2ML IJ SOLN: 4.0000 mg | Freq: Three times a day (TID) | INTRAMUSCULAR | Status: DC | PRN

## 2021-07-02 MED ORDER — OLANZAPINE 5 MG PO TABS: 5.0000 mg | ORAL_TABLET | Freq: Four times a day (QID) | ORAL | Status: DC | PRN

## 2021-07-02 MED ORDER — DM-GUAIFENESIN ER 30-600 MG PO TB12
1.0000 | ORAL_TABLET | Freq: Two times a day (BID) | ORAL | Status: DC | PRN
  Administered 2021-07-02 – 2021-07-07 (×4): 1 via ORAL
  Filled 2021-07-02 (×5): qty 1

## 2021-07-02 MED ORDER — ALBUTEROL SULFATE (2.5 MG/3ML) 0.083% IN NEBU
3.0000 mL | INHALATION_SOLUTION | RESPIRATORY_TRACT | Status: DC | PRN
  Administered 2021-07-03: 3 mL via RESPIRATORY_TRACT
  Filled 2021-07-02: qty 3

## 2021-07-02 MED ORDER — AZITHROMYCIN 250 MG PO TABS
250.0000 mg | ORAL_TABLET | Freq: Every day | ORAL | Status: AC
  Administered 2021-07-03: 250 mg via ORAL
  Filled 2021-07-02: qty 1

## 2021-07-02 MED ORDER — AZITHROMYCIN 250 MG PO TABS: ORAL_TABLET | ORAL | 0 refills | Status: DC

## 2021-07-02 MED ORDER — LORATADINE 10 MG PO TABS
10.0000 mg | ORAL_TABLET | Freq: Every day | ORAL | Status: DC
  Administered 2021-07-03 – 2021-07-09 (×7): 10 mg via ORAL
  Filled 2021-07-02 (×7): qty 1

## 2021-07-02 MED ORDER — NICOTINE 21 MG/24HR TD PT24
21.0000 mg | MEDICATED_PATCH | Freq: Every day | TRANSDERMAL | Status: DC
  Administered 2021-07-03 – 2021-07-09 (×7): 21 mg via TRANSDERMAL
  Filled 2021-07-02 (×7): qty 1

## 2021-07-02 MED ORDER — ADULT MULTIVITAMIN W/MINERALS CH
1.0000 | ORAL_TABLET | Freq: Every day | ORAL | Status: DC
  Administered 2021-07-03 – 2021-07-09 (×7): 1 via ORAL
  Filled 2021-07-02 (×7): qty 1

## 2021-07-02 MED ORDER — SENNOSIDES-DOCUSATE SODIUM 8.6-50 MG PO TABS
1.0000 | ORAL_TABLET | Freq: Two times a day (BID) | ORAL | Status: DC
Start: 2021-07-02 — End: 2021-07-09
  Administered 2021-07-02 – 2021-07-09 (×14): 1 via ORAL
  Filled 2021-07-02 (×14): qty 1

## 2021-07-02 MED ORDER — LORAZEPAM 0.5 MG PO TABS
0.5000 mg | ORAL_TABLET | ORAL | Status: DC | PRN
Start: 2021-07-02 — End: 2021-07-09
  Administered 2021-07-05 – 2021-07-09 (×4): 0.5 mg via ORAL
  Filled 2021-07-02 (×4): qty 1

## 2021-07-02 MED ORDER — OLANZAPINE 5 MG PO TABS: 5.0000 mg | ORAL_TABLET | Freq: Every day | ORAL | Status: DC

## 2021-07-02 MED ORDER — TRAZODONE HCL 50 MG PO TABS
50.0000 mg | ORAL_TABLET | Freq: Every day | ORAL | Status: DC
  Administered 2021-07-02 – 2021-07-08 (×7): 50 mg via ORAL
  Filled 2021-07-02 (×7): qty 1

## 2021-07-02 MED ORDER — RISPERIDONE 1 MG PO TABS
1.0000 mg | ORAL_TABLET | Freq: Two times a day (BID) | ORAL | Status: DC
  Administered 2021-07-02 – 2021-07-09 (×14): 1 mg via ORAL
  Filled 2021-07-02 (×14): qty 1

## 2021-07-02 NOTE — Tx Team (Signed)
Initial Treatment Plan 07/02/2021 6:35 PM Anna Adkins [id number]:[id number]    PATIENT STRESSORS: Health problems     PATIENT STRENGTHS: Active sense of humor  Communication skills  Supportive family/friends    PATIENT IDENTIFIED PROBLEMS: Anxiety                     DISCHARGE CRITERIA:  Ability to meet basic life and health needs Improved stabilization in mood, thinking, and/or behavior  PRELIMINARY DISCHARGE PLAN: Return to previous living arrangement  PATIENT/FAMILY INVOLVEMENT: This treatment plan has been presented to and reviewed with the patient, Anna Adkins. The patient has been given the opportunity to ask questions and make suggestions.  Doneen Poisson, RN 07/02/2021, 6:35 PM

## 2021-07-02 NOTE — Plan of Care (Signed)
  Problem: Education: Goal: Knowledge of General Education information will improve Description: Including pain rating scale, medication(s)/side effects and non-pharmacologic comfort measures 07/02/2021 1337 by Delila Spence, RN Outcome: Progressing 07/02/2021 1334 by Delila Spence, RN Outcome: Progressing 07/02/2021 1038 by Delila Spence, RN Outcome: Progressing   Problem: Health Behavior/Discharge Planning: Goal: Ability to manage health-related needs will improve 07/02/2021 1337 by Delila Spence, RN Outcome: Progressing 07/02/2021 1334 by Delila Spence, RN Outcome: Progressing 07/02/2021 1038 by Delila Spence, RN Outcome: Progressing   Problem: Clinical Measurements: Goal: Ability to maintain clinical measurements within normal limits will improve 07/02/2021 1337 by Delila Spence, RN Outcome: Progressing 07/02/2021 1334 by Delila Spence, RN Outcome: Progressing 07/02/2021 1038 by Delila Spence, RN Outcome: Progressing   Problem: Clinical Measurements: Goal: Will remain free from infection 07/02/2021 1337 by Delila Spence, RN Outcome: Progressing 07/02/2021 1334 by Delila Spence, RN Outcome: Progressing 07/02/2021 1038 by Delila Spence, RN Outcome: Progressing

## 2021-07-02 NOTE — Plan of Care (Signed)

## 2021-07-02 NOTE — Progress Notes (Signed)
Pt very friendly, happy and cooperative

## 2021-07-02 NOTE — Plan of Care (Deleted)
Problem: Education: Goal: Knowledge of General Education information will improve Description: Including pain rating scale, medication(s)/side effects and non-pharmacologic comfort measures 07/02/2021 1334 by Delila Spence, RN Outcome: Progressing 07/02/2021 1038 by Delila Spence, RN Outcome: Progressing   Problem: Health Behavior/Discharge Planning: Goal: Ability to manage health-related needs will improve 07/02/2021 1334 by Delila Spence, RN Outcome: Progressing 07/02/2021 1038 by Delila Spence, RN Outcome: Progressing   Problem: Clinical Measurements: Goal: Ability to maintain clinical measurements within normal limits will improve 07/02/2021 1334 by Delila Spence, RN Outcome: Progressing 07/02/2021 1038 by Delila Spence, RN Outcome: Progressing Goal: Will remain free from infection 07/02/2021 1334 by Delila Spence, RN Outcome: Progressing 07/02/2021 1038 by Delila Spence, RN Outcome: Progressing Goal: Diagnostic test results will improve 07/02/2021 1334 by Delila Spence, RN Outcome: Progressing 07/02/2021 1038 by Shella Maxim D, RN Outcome: Progressing Goal: Respiratory complications will improve 07/02/2021 1334 by Delila Spence, RN Outcome: Progressing 07/02/2021 1038 by Shella Maxim D, RN Outcome: Progressing Goal: Cardiovascular complication will be avoided 07/02/2021 1334 by Delila Spence, RN Outcome: Progressing 07/02/2021 1038 by Delila Spence, RN Outcome: Progressing   Problem: Activity: Goal: Risk for activity intolerance will decrease 07/02/2021 1334 by Delila Spence, RN Outcome: Progressing 07/02/2021 1038 by Delila Spence, RN Outcome: Progressing   Problem: Nutrition: Goal: Adequate nutrition will be maintained 07/02/2021 1334 by Delila Spence, RN Outcome: Progressing 07/02/2021 1038 by Delila Spence, RN Outcome: Progressing   Problem: Coping: Goal: Level of anxiety will decrease 07/02/2021  1334 by Delila Spence, RN Outcome: Progressing 07/02/2021 1038 by Delila Spence, RN Outcome: Progressing   Problem: Elimination: Goal: Will not experience complications related to bowel motility 07/02/2021 1334 by Delila Spence, RN Outcome: Progressing 07/02/2021 1038 by Delila Spence, RN Outcome: Progressing Goal: Will not experience complications related to urinary retention 07/02/2021 1334 by Delila Spence, RN Outcome: Progressing 07/02/2021 1038 by Delila Spence, RN Outcome: Progressing   Problem: Pain Managment: Goal: General experience of comfort will improve 07/02/2021 1334 by Delila Spence, RN Outcome: Progressing 07/02/2021 1038 by Delila Spence, RN Outcome: Progressing   Problem: Safety: Goal: Ability to remain free from injury will improve 07/02/2021 1334 by Delila Spence, RN Outcome: Progressing 07/02/2021 1038 by Delila Spence, RN Outcome: Progressing   Problem: Skin Integrity: Goal: Risk for impaired skin integrity will decrease 07/02/2021 1334 by Delila Spence, RN Outcome: Progressing 07/02/2021 1038 by Delila Spence, RN Outcome: Progressing   Problem: Education: Goal: Knowledge of disease or condition will improve 07/02/2021 1334 by Delila Spence, RN Outcome: Progressing 07/02/2021 1038 by Delila Spence, RN Outcome: Progressing Goal: Knowledge of the prescribed therapeutic regimen will improve 07/02/2021 1334 by Delila Spence, RN Outcome: Progressing 07/02/2021 1038 by Delila Spence, RN Outcome: Progressing Goal: Individualized Educational Video(s) 07/02/2021 1334 by Delila Spence, RN Outcome: Progressing 07/02/2021 1038 by Delila Spence, RN Outcome: Progressing   Problem: Activity: Goal: Ability to tolerate increased activity will improve 07/02/2021 1334 by Delila Spence, RN Outcome: Progressing 07/02/2021 1038 by Delila Spence, RN Outcome: Progressing Goal: Will verbalize the  importance of balancing activity with adequate rest periods 07/02/2021 1334 by Delila Spence, RN Outcome: Progressing 07/02/2021 1038 by Delila Spence, RN Outcome: Progressing   Problem: Respiratory: Goal: Ability to maintain a clear airway will improve 07/02/2021 1334 by Delila Spence, RN Outcome: Progressing 07/02/2021 1038  by Delila Spence, RN Outcome: Progressing Goal: Levels of oxygenation will improve 07/02/2021 1334 by Delila Spence, RN Outcome: Progressing 07/02/2021 1038 by Delila Spence, RN Outcome: Progressing Goal: Ability to maintain adequate ventilation will improve 07/02/2021 1334 by Delila Spence, RN Outcome: Progressing 07/02/2021 1038 by Delila Spence, RN Outcome: Progressing   Problem: Education: Goal: Knowledge of General Education information will improve Description: Including pain rating scale, medication(s)/side effects and non-pharmacologic comfort measures 07/02/2021 1334 by Delila Spence, RN Outcome: Progressing 07/02/2021 1038 by Delila Spence, RN Outcome: Progressing   Problem: Health Behavior/Discharge Planning: Goal: Ability to manage health-related needs will improve 07/02/2021 1334 by Delila Spence, RN Outcome: Progressing 07/02/2021 1038 by Delila Spence, RN Outcome: Progressing   Problem: Clinical Measurements: Goal: Ability to maintain clinical measurements within normal limits will improve 07/02/2021 1334 by Delila Spence, RN Outcome: Progressing 07/02/2021 1038 by Delila Spence, RN Outcome: Progressing Goal: Will remain free from infection 07/02/2021 1334 by Delila Spence, RN Outcome: Progressing 07/02/2021 1038 by Delila Spence, RN Outcome: Progressing Goal: Diagnostic test results will improve 07/02/2021 1334 by Delila Spence, RN Outcome: Progressing 07/02/2021 1038 by Shella Maxim D, RN Outcome: Progressing Goal: Respiratory complications will improve 07/02/2021 1334 by  Delila Spence, RN Outcome: Progressing 07/02/2021 1038 by Shella Maxim D, RN Outcome: Progressing Goal: Cardiovascular complication will be avoided 07/02/2021 1334 by Delila Spence, RN Outcome: Progressing 07/02/2021 1038 by Delila Spence, RN Outcome: Progressing   Problem: Activity: Goal: Risk for activity intolerance will decrease 07/02/2021 1334 by Delila Spence, RN Outcome: Progressing 07/02/2021 1038 by Delila Spence, RN Outcome: Progressing   Problem: Nutrition: Goal: Adequate nutrition will be maintained 07/02/2021 1334 by Delila Spence, RN Outcome: Progressing 07/02/2021 1038 by Delila Spence, RN Outcome: Progressing   Problem: Coping: Goal: Level of anxiety will decrease 07/02/2021 1334 by Delila Spence, RN Outcome: Progressing 07/02/2021 1038 by Delila Spence, RN Outcome: Progressing   Problem: Elimination: Goal: Will not experience complications related to bowel motility 07/02/2021 1334 by Delila Spence, RN Outcome: Progressing 07/02/2021 1038 by Delila Spence, RN Outcome: Progressing Goal: Will not experience complications related to urinary retention 07/02/2021 1334 by Delila Spence, RN Outcome: Progressing 07/02/2021 1038 by Delila Spence, RN Outcome: Progressing   Problem: Pain Managment: Goal: General experience of comfort will improve 07/02/2021 1334 by Delila Spence, RN Outcome: Progressing 07/02/2021 1038 by Delila Spence, RN Outcome: Progressing   Problem: Safety: Goal: Ability to remain free from injury will improve 07/02/2021 1334 by Delila Spence, RN Outcome: Progressing 07/02/2021 1038 by Shella Maxim D, RN Outcome: Progressing   Problem: Skin Integrity: Goal: Risk for impaired skin integrity will decrease 07/02/2021 1334 by Delila Spence, RN Outcome: Progressing 07/02/2021 1038 by Delila Spence, RN Outcome: Progressing

## 2021-07-02 NOTE — BH Assessment (Incomplete)
1905 Received patient sitting in the day room coloring. She is alert and oriented x 4, affect is blunted. Will inter view patient after report is received.   2000 Patient  is alert and oriented x 4, and is currently stating that she don't remember how she got to the hospital.denies being SI/HI, A/V hallucinations, pain. She does state that she is depressed and have problems with depression. She admits to finding pleasure in coloring something she has not done in years.  2200 Patient compliant with medication regimen and requested and received Mucinex DM for cough. Patient can be hyper-verbal and somewhat.

## 2021-07-02 NOTE — Progress Notes (Signed)
Patient admitted voluntary to [facility] from [facility] ED with diagnosis of anxiety. Patient presents to unit ambulatory  A&Ox4. Patient states, "I am a single mother of 4." Patient's affect is calm and pleasant, speech is normal and thoughts are organized. Patient endorses depression and anxiety 6/10. Patient currently denies suicidal ideations, homicidal ideations, audio or visual hallucinations and verbally contracts for safety on unit.  Patient denies drug abuse but does endorse occasional ETOH use and cigarette smoking. Patient reports living alone with 4 children as her support system.  Emotional support and reassurance provided throughout admission intake. Afterwards, oriented patient to unit, room and call light, reviewed POC with all questions answered and understanding verbalzied.Denies any needs at this time. Will continue to monitor with ongoing Q 15 minute safety checks per unit protocol.

## 2021-07-03 DIAGNOSIS — F333 Major depressive disorder, recurrent, severe with psychotic symptoms: Secondary | ICD-10-CM | POA: Diagnosis not present

## 2021-07-03 MED ORDER — ACETAMINOPHEN 325 MG PO TABS
650.0000 mg | ORAL_TABLET | Freq: Four times a day (QID) | ORAL | Status: DC | PRN
Start: 2021-07-03 — End: 2021-07-09
  Administered 2021-07-03 – 2021-07-09 (×14): 650 mg via ORAL
  Filled 2021-07-03 (×14): qty 2

## 2021-07-03 NOTE — H&P (Signed)
Psychiatric Admission Assessment Adult  Patient Identification: Anna Adkins MRN:  [id number] Date of Evaluation:  07/03/2021 Chief Complaint:  MDD (major depressive disorder), recurrent, severe, with psychosis (HCC) [F33.3] Principal Diagnosis: MDD (major depressive disorder), recurrent, severe, with psychosis (HCC) Diagnosis:  Principal Problem:   MDD (major depressive disorder), recurrent, severe, with psychosis (HCC)  History of Present Illness: Anna Adkins is a 56 year old white female who is voluntarily admitted to geriatric psychiatry on transfer from internal medicine.  She presented to the emergency room with her daughter after having a panic attack but was found to have bronchitis.  She does have a history of COPD.  She was admitted to the medicine and treated with antibiotics and nebulizers and transferred to psychiatry.  She was previously admitted to psychiatry in May 2022.  She endorses anhedonia, difficulty sleeping, panic attacks, anxiety, and depressed mood.  She currently lives in [place] by herself but states that her daughter will be moving in with her soon.  She cannot drive very far because of her panic attacks.  This has limited her access to mental health care.  She has been off of her medications because she has not seen a psychiatrist in a while.  She has been treated in [place], [place] and [place].  She does not know the names of the places that she has been.  We have restarted her psychiatric medications.  She finished her antibiotic yesterday.  She is able to contract for safety in the hospital.  She will need new appointments for psychiatric follow-up when she is discharged.  She also has a history of DVT and pulmonary emboli along with TIA.  Associated Signs/Symptoms: Depression Symptoms:  depressed mood, anhedonia, difficulty concentrating, anxiety, Duration of Depression Symptoms: No data recorded (Hypo) Manic Symptoms:  Flight of Ideas, Hallucinations, Anxiety  Symptoms:  Panic Symptoms, Psychotic Symptoms:  Hallucinations: Auditory PTSD Symptoms: NA Total Time spent with patient: 1 hour  Past Psychiatric History: MDD (major depressive disorder), recurrent, severe, with psychosis (HCC)  Is the patient at risk to self? Yes.    Has the patient been a risk to self in the past 6 months? Yes.    Has the patient been a risk to self within the distant past? Yes.    Is the patient a risk to others? No.  Has the patient been a risk to others in the past 6 months? No.  Has the patient been a risk to others within the distant past? No.   Prior Inpatient Therapy:   Prior Outpatient Therapy:    Alcohol Screening: 1. How often do you have a drink containing alcohol?: Monthly or less 2. How many drinks containing alcohol do you have on a typical day when you are drinking?: 1 or 2 3. How often do you have six or more drinks on one occasion?: Never AUDIT-C Score: 1 4. How often during the last year have you found that you were not able to stop drinking once you had started?: Never 5. How often during the last year have you failed to do what was normally expected from you because of drinking?: Never 6. How often during the last year have you needed a first drink in the morning to get yourself going after a heavy drinking session?: Never 7. How often during the last year have you had a feeling of guilt of remorse after drinking?: Never 8. How often during the last year have you been unable to remember what happened the night before because you  had been drinking?: Never 9. Have you or someone else been injured as a result of your drinking?: No 10. Has a relative or friend or a doctor or another health worker been concerned about your drinking or suggested you cut down?: No Alcohol Use Disorder Identification Test Final Score (AUDIT): 1 Substance Abuse History in the last 12 months:  No. Consequences of Substance Abuse: NA Previous Psychotropic Medications: Yes   Psychological Evaluations: Yes  Past Medical History:  Past Medical History:  Diagnosis Date   Anxiety    Anxiety    Arthritis    Depression    Depression    DVT (deep venous thrombosis) (HCC)    DVT of leg (deep venous thrombosis) (HCC) 2014   Pulmonary emboli (HCC)    TIA (transient ischemic attack) 2014    Past Surgical History:  Procedure Laterality Date   Blood clot removal     ECTOPIC PREGNANCY SURGERY     HERNIA REPAIR     HERNIA REPAIR  2001   SKIN GRAFT     TUBAL LIGATION     Family History:  Family History  Problem Relation Age of Onset   Healthy Mother    Heart disease Father    Diabetes Father    Asthma Father    Breast cancer Neg Hx    Family Psychiatric  History: Unremarkable Tobacco Screening:   Social History:  Social History   Substance and Sexual Activity  Alcohol Use Yes   Alcohol/week: 1.0 standard drink of alcohol   Types: 1 Glasses of wine per week   Comment: occasional     Social History   Substance and Sexual Activity  Drug Use No    Additional Social History:                           Allergies:   Allergies  Allergen Reactions   Sulfa Antibiotics Anaphylaxis   Lab Results:  Results for orders placed or performed during the hospital encounter of 06/27/21 (from the past 48 hour(s))  SARS Coronavirus 2 by RT PCR (hospital order, performed in [facility] hospital lab) *cepheid single result test* Anterior Nasal Swab     Status: None   Collection Time: 07/02/21  1:20 PM   Specimen: Anterior Nasal Swab  Result Value Ref Range   SARS Coronavirus 2 by RT PCR NEGATIVE NEGATIVE    Comment: (NOTE) SARS-CoV-2 target nucleic acids are NOT DETECTED.  The SARS-CoV-2 RNA is generally detectable in upper and lower respiratory specimens during the acute phase of infection. The lowest concentration of SARS-CoV-2 viral copies this assay can detect is 250 copies / mL. A negative result does not preclude SARS-CoV-2 infection and  should not be used as the sole basis for treatment or other patient management decisions.  A negative result may occur with improper specimen collection / handling, submission of specimen other than nasopharyngeal swab, presence of viral mutation(s) within the areas targeted by this assay, and inadequate number of viral copies (<250 copies / mL). A negative result must be combined with clinical observations, patient history, and epidemiological information.  Fact Sheet for Patients:   RoadLapTop.co.za  Fact Sheet for Healthcare Providers: http://kim-miller.com/  This test is not yet approved or  cleared by the [place] FDA and has been authorized for detection and/or diagnosis of SARS-CoV-2 by FDA under an Emergency Use Authorization (EUA).  This EUA will remain in effect (meaning this test can be used)  for the duration of the COVID-19 declaration under Section 564(b)(1) of the Act, 21 U.S.C. section 360bbb-3(b)(1), unless the authorization is terminated or revoked sooner.  Performed at [facility], [address], [place], [place] [address]     Blood Alcohol level:  Lab Results  Component Value Date   [facility] <10 06/27/2021   ETH <10 05/14/2020    Metabolic Disorder Labs:  Lab Results  Component Value Date   HGBA1C 5.8 (H) 05/18/2020   MPG 119.76 05/18/2020   No results found for: "PROLACTIN" Lab Results  Component Value Date   CHOL 173 05/18/2020   TRIG 224 (H) 05/18/2020   HDL 37 (L) 05/18/2020   CHOLHDL 4.7 05/18/2020   VLDL 45 (H) 05/18/2020   LDLCALC 91 05/18/2020    Current Medications: Current Facility-Administered Medications  Medication Dose Route Frequency Provider Last Rate Last Admin   albuterol (PROVENTIL) (2.5 MG/3ML) 0.083% nebulizer solution 3 mL  3 mL Inhalation Q4H PRN Sarina Ill, DO   3 mL at 07/03/21 1218   alum & mag hydroxide-simeth (MAALOX/MYLANTA) 200-200-20 MG/5ML  suspension 30 mL  30 mL Oral Q4H PRN Sarina Ill, DO       apixaban Everlene Balls) tablet 5 mg  5 mg Oral BID Sarina Ill, DO   5 mg at 07/03/21 3086   dextromethorphan-guaiFENesin (MUCINEX DM) 30-600 MG per 12 hr tablet 1 tablet  1 tablet Oral BID PRN Sarina Ill, DO   1 tablet at 07/03/21 0958   escitalopram (LEXAPRO) tablet 20 mg  20 mg Oral QHS Jaynie Bream, RPH   20 mg at 07/02/21 2145   loratadine (CLARITIN) tablet 10 mg  10 mg Oral Daily Sarina Ill, DO   10 mg at 07/03/21 0830   LORazepam (ATIVAN) tablet 0.5 mg  0.5 mg Oral Q4H PRN Sarina Ill, DO       magnesium hydroxide (MILK OF MAGNESIA) suspension 30 mL  30 mL Oral Daily PRN Sarina Ill, DO       multivitamin with minerals tablet 1 tablet  1 tablet Oral Daily Sarina Ill, DO   1 tablet at 07/03/21 0830   nicotine (NICODERM CQ - dosed in mg/24 hours) patch 21 mg  21 mg Transdermal Daily Sarina Ill, DO   21 mg at 07/03/21 0837   OLANZapine (ZYPREXA) tablet 5 mg  5 mg Oral Q6H PRN Sarina Ill, DO       ondansetron [facility]) injection 4 mg  4 mg Intravenous Q8H PRN Sarina Ill, DO       polyethylene glycol (MIRALAX / GLYCOLAX) packet 17 g  17 g Oral Daily Sarina Ill, DO       risperiDONE (RISPERDAL) tablet 1 mg  1 mg Oral BID Sarina Ill, DO   1 mg at 07/03/21 5784   senna-docusate (Senokot-S) tablet 1 tablet  1 tablet Oral BID Sarina Ill, DO   1 tablet at 07/03/21 0831   traZODone (DESYREL) tablet 50 mg  50 mg Oral QHS Sarina Ill, DO   50 mg at 07/02/21 2144   PTA Medications: Medications Prior to Admission  Medication Sig Dispense Refill Last Dose   azithromycin (ZITHROMAX Z-PAK) 250 MG tablet Take 2 tablets (500 mg) on  Day 1,  followed by 1 tablet (250 mg) once daily on Days 2 through 5. 2 each 0    dextromethorphan-guaiFENesin (MUCINEX DM) 30-600 MG 12hr tablet  Take 1 tablet by  mouth 2 (two) times daily as needed for cough.      ELIQUIS 5 MG TABS tablet Take 5 mg by mouth 2 (two) times daily.      loratadine (CLARITIN) 10 MG tablet Take 10 mg by mouth daily.      Multiple Vitamin (MULTIVITAMIN WITH MINERALS) TABS tablet Take 1 tablet by mouth daily.      nicotine (NICODERM CQ - DOSED IN MG/24 HOURS) 21 mg/24hr patch Place 1 patch (21 mg total) onto the skin daily. 28 patch 0    predniSONE (DELTASONE) 20 MG tablet Take 2 tablets (40 mg total) by mouth daily with breakfast. Take prednisone 40mg  po daily x2 days then stop.      risperiDONE (RISPERDAL) 1 MG tablet Take 1 tablet (1 mg total) by mouth 2 (two) times daily.      traZODone (DESYREL) 50 MG tablet Take 1 tablet (50 mg total) by mouth at bedtime. (Patient not taking: Reported on 06/27/2021) 30 tablet 0    VENTOLIN HFA 108 (90 Base) MCG/ACT inhaler Inhale 1-2 puffs into the lungs every 4 (four) hours as needed.       Musculoskeletal: Strength & Muscle Tone: within normal limits Gait & Station: normal Patient leans: N/A            Psychiatric Specialty Exam:  Presentation  General Appearance: Bizarre  Eye Contact:Fair  Speech:Normal Rate  Speech Volume:Normal  Handedness:Right   Mood and Affect  Mood:Anxious; Depressed  Affect:Inappropriate; Full Range   Thought Process  Thought Processes:Disorganized  Duration of Psychotic Symptoms: Greater than six months  Past Diagnosis of Schizophrenia or Psychoactive disorder: No  Descriptions of Associations:Tangential  Orientation:Full (Time, Place and Person)  Thought Content:Illogical; Tangential  Hallucinations:No data recorded Ideas of Reference:No data recorded Suicidal Thoughts:No data recorded Homicidal Thoughts:No data recorded  Sensorium  Memory:Immediate Fair  Judgment:Impaired  Insight:Lacking   Executive Functions  Concentration:Poor  Attention Span:Poor  Recall:Poor  Fund of  Knowledge:Poor  Language:Poor   Psychomotor Activity  Psychomotor Activity:No data recorded  Assets  Assets:Desire for Improvement; Talents/Skills; Housing; Social Support   Sleep  Sleep:No data recorded   Physical Exam: Physical Exam Constitutional:      Appearance: Normal appearance.  HENT:     Head: Normocephalic and atraumatic.     Mouth/Throat:     Pharynx: Oropharynx is clear.  Eyes:     Pupils: Pupils are equal, round, and reactive to light.  Cardiovascular:     Rate and Rhythm: Normal rate and regular rhythm.  Pulmonary:     Effort: Pulmonary effort is normal.     Breath sounds: Normal breath sounds.  Abdominal:     General: Abdomen is flat.     Palpations: Abdomen is soft.  Musculoskeletal:        General: Normal range of motion.  Skin:    General: Skin is warm and dry.  Neurological:     General: No focal deficit present.     Mental Status: She is alert. Mental status is at baseline.  Psychiatric:        Attention and Perception: Attention normal. She perceives auditory hallucinations.        Mood and Affect: Mood is anxious and depressed. Affect is flat.        Speech: Speech normal.        Behavior: Behavior is cooperative.        Thought Content: Thought content normal.        Cognition and Memory: Cognition  and memory normal.        Judgment: Judgment normal.    Review of Systems  Constitutional: Negative.   HENT: Negative.    Eyes: Negative.   Respiratory: Negative.    Cardiovascular: Negative.   Gastrointestinal: Negative.   Genitourinary: Negative.   Musculoskeletal: Negative.   Skin: Negative.   Neurological: Negative.   Endo/Heme/Allergies: Negative.   Psychiatric/Behavioral:  Positive for depression and hallucinations. The patient has insomnia.    Blood pressure 113/79, pulse 87, temperature 98.3 F (36.8 C), resp. rate 20, height 5\' 8"  (1.727 m), weight 119.5 kg, SpO2 94 %. Body mass index is 40.07 kg/m.  Treatment Plan  Summary: Daily contact with patient to assess and evaluate symptoms and progress in treatment, Medication management, and Plan see orders  Observation Level/Precautions:  15 minute checks  Laboratory:  CBC Chemistry Profile HbAIC  Psychotherapy:    Medications:    Consultations:    Discharge Concerns:    Estimated LOS:  Other:     Physician Treatment Plan for Primary Diagnosis: MDD (major depressive disorder), recurrent, severe, with psychosis (HCC) Long Term Goal(s): Improvement in symptoms so as ready for discharge  Short Term Goals: Ability to identify changes in lifestyle to reduce recurrence of condition will improve, Ability to verbalize feelings will improve, Ability to disclose and discuss suicidal ideas, Ability to demonstrate self-control will improve, Ability to identify and develop effective coping behaviors will improve, Ability to maintain clinical measurements within normal limits will improve, Compliance with prescribed medications will improve, and Ability to identify triggers associated with substance abuse/mental health issues will improve  Physician Treatment Plan for Secondary Diagnosis: Principal Problem:   MDD (major depressive disorder), recurrent, severe, with psychosis (HCC)  I certify that inpatient services furnished can reasonably be expected to improve the patient's condition.    Sarina Ill, DO 6/23/202312:23 PM

## 2021-07-04 DIAGNOSIS — F333 Major depressive disorder, recurrent, severe with psychotic symptoms: Secondary | ICD-10-CM | POA: Diagnosis not present

## 2021-07-04 NOTE — Progress Notes (Signed)
Patient observed in dayroom early this morning and is cooperative/sociable. Patient currently denies SI, HI, and A/V/H with no plan/intent. Does state having anxiety and feeling depressed but states she is feeling improvement. Patient verbalized lower back pain and was given tylenol po prn. Patient mentioned being interested in possibly having a home health aid after discharging since she feels like she could use some assistance with driving to her appointments and some company. Patient noted to have a steady gait with walker. No SOB but does cough with no sputum noted. Patient stated its due to her bronchitis. Patient Spends time in dayroom coloring stating that it helps relax her. No other concerns at this moment.

## 2021-07-05 ENCOUNTER — Other Ambulatory Visit: Payer: Self-pay | Admitting: Internal Medicine

## 2021-07-05 ENCOUNTER — Inpatient Hospital Stay: Payer: Medicare Other

## 2021-07-05 LAB — BASIC METABOLIC PANEL
Anion gap: 4 — ABNORMAL LOW (ref 5–15)
BUN: 19 mg/dL (ref 6–20)
CO2: 33 mmol/L — ABNORMAL HIGH (ref 22–32)
Calcium: 9.3 mg/dL (ref 8.9–10.3)
Chloride: 100 mmol/L (ref 98–111)
Creatinine, Ser: 0.89 mg/dL (ref 0.44–1.00)
GFR, Estimated: >60 mL/min (ref >60)
Glucose, Bld: 104 mg/dL — ABNORMAL HIGH (ref 70–99)
Potassium: 4.7 mmol/L (ref 3.5–5.1)
Sodium: 137 mmol/L (ref 135–145)

## 2021-07-05 LAB — CBC
HCT: 45.6 % (ref 36.0–46.0)
Hemoglobin: 14.7 g/dL (ref 12.0–15.0)
MCH: 30.1 pg (ref 26.0–34.0)
MCHC: 32.2 g/dL (ref 30.0–36.0)
MCV: 93.3 fL (ref 80.0–100.0)
Platelets: 242 10*3/uL (ref 150–400)
RBC: 4.89 MIL/uL (ref 3.87–5.11)
RDW: 13.5 % (ref 11.5–15.5)
WBC: 7.9 10*3/uL (ref 4.0–10.5)
nRBC: 0 % (ref 0.0–0.2)

## 2021-07-05 LAB — MAGNESIUM: Magnesium: 2.2 mg/dL (ref 1.7–2.4)

## 2021-07-05 LAB — D-DIMER, QUANTITATIVE: D-Dimer, Quant: 0.46 ug/mL-FEU (ref 0.00–0.50)

## 2021-07-05 MED ORDER — ONDANSETRON HCL 4 MG PO TABS
4.0000 mg | ORAL_TABLET | Freq: Once | ORAL | Status: AC
Start: 2021-07-05 — End: 2021-07-05
  Administered 2021-07-05: 4 mg via ORAL
  Filled 2021-07-05: qty 1

## 2021-07-05 NOTE — Progress Notes (Signed)
Pt complained of nausea. I gave pt saltine crackers and after a couple hours she said she was still nauseous. Pt was given PO Zofran 4 mg as per verbal orders from on call NP. She is in no current distress and is safe on the unit. Q 15 min checks in place.

## 2021-07-05 NOTE — Progress Notes (Addendum)
[facility] MD Progress Note  07/05/2021 10:37 AM Anna Adkins  MRN:  [id number] Subjective:   6/25 Patient reports right leg pain and swelling.  She has a history of DVTs.  A CT angiogram was completed on June 28, 2021.  No ultrasound recently.  She is agreeable to get an ultrasound this morning.  She reports increased anxiety due to the leg pain.  She did not sleep due to lower back pain.  She denies thoughts of suicide.  Yesterday evening, she complained of indigestion and heartburn.  Reports mild to moderate depression.  6/24 Patient reports feeling great. Reports having bad anxiety in  past week due to family issues. As a result of stress, indicates "hearing voices of a higher power." Does not elaborate on this.   She slept poor last night. Eating well.   Really adamant that she does not have COPD or emphysema, but only bronchitis. Nevertheless, she denies shortness of breath.   No suicidal. She is alert on times three.   Complains of lower back pain. No panic attacks in the past 24 hours.   She is bright and social.  Principal Problem: MDD (major depressive disorder), recurrent, severe, with psychosis (HCC) Diagnosis: Principal Problem:   MDD (major depressive disorder), recurrent, severe, with psychosis (HCC)  Total Time spent with patient: 30 minutes   Past Medical History:  Past Medical History:  Diagnosis Date   Anxiety    Anxiety    Arthritis    Depression    Depression    DVT (deep venous thrombosis) (HCC)    DVT of leg (deep venous thrombosis) (HCC) 2014   Pulmonary emboli (HCC)    TIA (transient ischemic attack) 2014    Past Surgical History:  Procedure Laterality Date   Blood clot removal     ECTOPIC PREGNANCY SURGERY     HERNIA REPAIR     HERNIA REPAIR  2001   SKIN GRAFT     TUBAL LIGATION     Family History:  Family History  Problem Relation Age of Onset   Healthy Mother    Heart disease Father    Diabetes Father    Asthma Father    Breast  cancer Neg Hx    Social History:  Social History   Substance and Sexual Activity  Alcohol Use Yes   Alcohol/week: 1.0 standard drink of alcohol   Types: 1 Glasses of wine per week   Comment: occasional     Social History   Substance and Sexual Activity  Drug Use No    Social History   Socioeconomic History   Marital status: Single    Spouse name: Not on file   Number of children: 4   Years of education: Not on file   Highest education level: Not on file  Occupational History   Not on file  Tobacco Use   Smoking status: Every Day    Packs/day: 1.00    Years: 20.00    Total pack years: 20.00    Types: Cigarettes   Smokeless tobacco: Never  Vaping Use   Vaping Use: Never used  Substance and Sexual Activity   Alcohol use: Yes    Alcohol/week: 1.0 standard drink of alcohol    Types: 1 Glasses of wine per week    Comment: occasional   Drug use: No   Sexual activity: Not on file  Other Topics Concern   Not on file  Social History Narrative   Not on file   Social  Determinants of Health   Financial Resource Strain: Not on file  Food Insecurity: Not on file  Transportation Needs: Not on file  Physical Activity: Not on file  Stress: Not on file  Social Connections: Not on file   Additional Social History:                       Current Medications: Current Facility-Administered Medications  Medication Dose Route Frequency Provider Last Rate Last Admin   acetaminophen (TYLENOL) tablet 650 mg  650 mg Oral Q6H PRN Sarina Ill, DO   650 mg at 07/05/21 0944   albuterol (PROVENTIL) (2.5 MG/3ML) 0.083% nebulizer solution 3 mL  3 mL Inhalation Q4H PRN Sarina Ill, DO   3 mL at 07/03/21 1218   alum & mag hydroxide-simeth (MAALOX/MYLANTA) 200-200-20 MG/5ML suspension 30 mL  30 mL Oral Q4H PRN Sarina Ill, DO   30 mL at 07/04/21 1831   apixaban (ELIQUIS) tablet 5 mg  5 mg Oral BID Sarina Ill, DO   5 mg at 07/05/21  0944   dextromethorphan-guaiFENesin (MUCINEX DM) 30-600 MG per 12 hr tablet 1 tablet  1 tablet Oral BID PRN Sarina Ill, DO   1 tablet at 07/03/21 0958   escitalopram (LEXAPRO) tablet 20 mg  20 mg Oral QHS Jaynie Bream, RPH   20 mg at 07/04/21 2145   loratadine (CLARITIN) tablet 10 mg  10 mg Oral Daily Sarina Ill, DO   10 mg at 07/05/21 0944   LORazepam (ATIVAN) tablet 0.5 mg  0.5 mg Oral Q4H PRN Sarina Ill, DO   0.5 mg at 07/05/21 2536   magnesium hydroxide (MILK OF MAGNESIA) suspension 30 mL  30 mL Oral Daily PRN Sarina Ill, DO       multivitamin with minerals tablet 1 tablet  1 tablet Oral Daily Sarina Ill, DO   1 tablet at 07/05/21 0944   nicotine (NICODERM CQ - dosed in mg/24 hours) patch 21 mg  21 mg Transdermal Daily Sarina Ill, DO   21 mg at 07/05/21 0944   OLANZapine (ZYPREXA) tablet 5 mg  5 mg Oral Q6H PRN Sarina Ill, DO       ondansetron [facility]) injection 4 mg  4 mg Intravenous Q8H PRN Sarina Ill, DO       polyethylene glycol (MIRALAX / GLYCOLAX) packet 17 g  17 g Oral Daily Sarina Ill, DO   17 g at 07/05/21 0945   risperiDONE (RISPERDAL) tablet 1 mg  1 mg Oral BID Sarina Ill, DO   1 mg at 07/05/21 0944   senna-docusate (Senokot-S) tablet 1 tablet  1 tablet Oral BID Sarina Ill, DO   1 tablet at 07/05/21 0944   traZODone (DESYREL) tablet 50 mg  50 mg Oral QHS Sarina Ill, DO   50 mg at 07/04/21 2145    Lab Results:  No results found for this or any previous visit (from the past 48 hour(s)).   Blood Alcohol level:  Lab Results  Component Value Date   ETH <10 06/27/2021   ETH <10 05/14/2020    Metabolic Disorder Labs: Lab Results  Component Value Date   HGBA1C 5.8 (H) 05/18/2020   MPG 119.76 05/18/2020   No results found for: "PROLACTIN" Lab Results  Component Value Date   CHOL 173 05/18/2020   TRIG 224 (H)  05/18/2020   HDL 37 (L) 05/18/2020   CHOLHDL  4.7 05/18/2020   VLDL 45 (H) 05/18/2020   LDLCALC 91 05/18/2020    Physical Findings: AIMS:  , ,  ,  ,    CIWA:    COWS:     Musculoskeletal: Strength & Muscle Tone: within normal limits Gait & Station: normal Patient leans: Left  Psychiatric Specialty Exam:  Presentation  General Appearance: Bizarre  Eye Contact:Fair  Speech:Normal Rate  Speech Volume:Normal  Handedness:Right   Mood and Affect  Mood:Anxious Affect:congruent  Thought Process  Thought Processes:Disorganized  Descriptions of Associations:Tangential  Orientation:Full (Time, Place and Person)  Thought Content:Illogical; Tangential  History of Schizophrenia/Schizoaffective disorder:No  Duration of Psychotic Symptoms:Greater than six months  Hallucinations:No data recorded Ideas of Reference:No data recorded Suicidal Thoughts:No data recorded Homicidal Thoughts:No data recorded  Sensorium  Memory:Immediate Fair  Judgment:Impaired  Insight:Lacking   Executive Functions  Concentration:Poor  Attention Span:Poor  Recall:Poor  Fund of Knowledge:Poor  Language:Poor   Psychomotor Activity  Psychomotor Activity:No data recorded  Assets  Assets:Desire for Improvement; Talents/Skills; Housing; Social Support   Sleep  Sleep:No data recorded   Physical Exam: Physical Exam ROS Blood pressure 104/66, pulse 93, temperature 98.7 F (37.1 C), resp. rate 18, height 5\' 8"  (1.727 m), weight 119.5 kg, SpO2 91 %. Body mass index is 40.07 kg/m.   Treatment Plan Summary: Daily contact with patient to assess and evaluate symptoms and progress in treatment and Medication management  6/24 No changes. 92% Os Sat  6/25 Stat Venous u/s Stat D-dimer Stat CBC, BMP, Mg Will consider hospitalist consult.   Reggie Pile, MD 07/05/2021, 10:37 AM

## 2021-07-05 NOTE — Progress Notes (Signed)
Patient observed in dayroom appeared anxious and tearful. Patient stated she had trouble sleeping last night and was worried about her "medical issues." Patient denied SI, HI, and A/V/H with no plan/intent but was really worried about her legs. Patient stated she was having R leg pain and has previous hx of DVT. MD notified and ultrasound and blood work completed. Patient also appeared hyperfocused on ED experience expressing that she barely remembered what happened but that she keeps having flashbacks of the security guards. Patient expressed that she doesn't want to feel "psychotic" again. Patient has been cooperative on unit and states she wishes to continue to work on her coping skills. Patient given ativan po prn and provided with emotional support. Patient in no current distress and absent from any injuries.

## 2021-07-05 NOTE — Group Note (Signed)
LCSW Group Therapy Note  Group Date: 07/05/2021 Start Time: 1300 End Time: 1400   Type of Therapy and Topic:  Group Therapy - Healthy vs Unhealthy Coping Skills  Participation Level:  Did Not Attend   Description of Group The focus of this group was to determine what unhealthy coping techniques typically are used by group members and what healthy coping techniques would be helpful in coping with various problems. Patients were guided in becoming aware of the differences between healthy and unhealthy coping techniques. Patients were asked to identify 2-3 healthy coping skills they would like to learn to use more effectively.  Therapeutic Goals Patients learned that coping is what human beings do all day long to deal with various situations in their lives Patients defined and discussed healthy vs unhealthy coping techniques Patients identified their preferred coping techniques and identified whether these were healthy or unhealthy Patients determined 2-3 healthy coping skills they would like to become more familiar with and use more often. Patients provided support and ideas to each other   Summary of Patient Progress: Patient did not attend group despite encouraged participation.    Therapeutic Modalities Cognitive Behavioral Therapy Motivational Interviewing  Almedia Balls 07/05/2021  2:23 PM

## 2021-07-06 NOTE — BH Assessment (Signed)
Late Note: 1940 Received patient sitting in the dayroom with peers. She is denying SI/HI, A/V hallucinations, her mood is good and pain. Her interactions with her peers are positive.   2310 Patient requested and received Tylenol 650 mg for back pain 7/10.  0600 Patient up alert and oriented x 4 she is complaining of back pain and is requesting Tylenol and received Tylenol 650 mg for back.

## 2021-07-06 NOTE — Progress Notes (Signed)
Patient seen in dayroom at the beginning of the shift. She denies anxiety, depression, SI, HI, and AVH. She rates right shin pain 4/10. She is medication compliant. She is safe on the unit at this time. Q 15 min safety checks in place.

## 2021-07-07 NOTE — Progress Notes (Signed)
[facility] MD Progress Note  07/07/2021 1:09 PM Anna Adkins  MRN:  [id number] Subjective: Anna Adkins is taking her medications as prescribed and denies any side effects.  She feels that the medicines have been helpful.  She does have some trouble sleeping at night and states that she has a lot of back pain and trouble walking.  The nurses asked if we can get a physical therapy consult and I said yes.  She is able to contract for safety in the hospital.  She is going to need follow-up appointments I will discuss this with social work.  Principal Problem: MDD (major depressive disorder), recurrent, severe, with psychosis (HCC) Diagnosis: Principal Problem:   MDD (major depressive disorder), recurrent, severe, with psychosis (HCC)  Total Time spent with patient: 15 minutes  Past Psychiatric History: MDD with PF  Past Medical History:  Past Medical History:  Diagnosis Date   Anxiety    Anxiety    Arthritis    Depression    Depression    DVT (deep venous thrombosis) (HCC)    DVT of leg (deep venous thrombosis) (HCC) 2014   Pulmonary emboli (HCC)    TIA (transient ischemic attack) 2014    Past Surgical History:  Procedure Laterality Date   Blood clot removal     ECTOPIC PREGNANCY SURGERY     HERNIA REPAIR     HERNIA REPAIR  2001   SKIN GRAFT     TUBAL LIGATION     Family History:  Family History  Problem Relation Age of Onset   Healthy Mother    Heart disease Father    Diabetes Father    Asthma Father    Breast cancer Neg Hx     Social History:  Social History   Substance and Sexual Activity  Alcohol Use Yes   Alcohol/week: 1.0 standard drink of alcohol   Types: 1 Glasses of wine per week   Comment: occasional     Social History   Substance and Sexual Activity  Drug Use No    Social History   Socioeconomic History   Marital status: Single    Spouse name: Not on file   Number of children: 4   Years of education: Not on file   Highest education level: Not on file   Occupational History   Not on file  Tobacco Use   Smoking status: Every Day    Packs/day: 1.00    Years: 20.00    Total pack years: 20.00    Types: Cigarettes   Smokeless tobacco: Never  Vaping Use   Vaping Use: Never used  Substance and Sexual Activity   Alcohol use: Yes    Alcohol/week: 1.0 standard drink of alcohol    Types: 1 Glasses of wine per week    Comment: occasional   Drug use: No   Sexual activity: Not on file  Other Topics Concern   Not on file  Social History Narrative   Not on file   Social Determinants of Health   Financial Resource Strain: Not on file  Food Insecurity: Not on file  Transportation Needs: Not on file  Physical Activity: Not on file  Stress: Not on file  Social Connections: Not on file   Additional Social History:                         Sleep: Fair  Appetite:  Good  Current Medications: Current Facility-Administered Medications  Medication Dose Route Frequency Provider  Last Rate Last Admin   acetaminophen (TYLENOL) tablet 650 mg  650 mg Oral Q6H PRN Sarina Ill, DO   650 mg at 07/07/21 1217   albuterol (PROVENTIL) (2.5 MG/3ML) 0.083% nebulizer solution 3 mL  3 mL Inhalation Q4H PRN Sarina Ill, DO   3 mL at 07/03/21 1218   alum & mag hydroxide-simeth (MAALOX/MYLANTA) 200-200-20 MG/5ML suspension 30 mL  30 mL Oral Q4H PRN Sarina Ill, DO   30 mL at 07/04/21 1831   apixaban (ELIQUIS) tablet 5 mg  5 mg Oral BID Sarina Ill, DO   5 mg at 07/07/21 0810   dextromethorphan-guaiFENesin (MUCINEX DM) 30-600 MG per 12 hr tablet 1 tablet  1 tablet Oral BID PRN Sarina Ill, DO   1 tablet at 07/06/21 1733   escitalopram (LEXAPRO) tablet 20 mg  20 mg Oral QHS Jaynie Bream, RPH   20 mg at 07/06/21 2213   loratadine (CLARITIN) tablet 10 mg  10 mg Oral Daily Sarina Ill, DO   10 mg at 07/07/21 1610   LORazepam (ATIVAN) tablet 0.5 mg  0.5 mg Oral Q4H PRN Sarina Ill, DO   0.5 mg at 07/05/21 9604   magnesium hydroxide (MILK OF MAGNESIA) suspension 30 mL  30 mL Oral Daily PRN Sarina Ill, DO       multivitamin with minerals tablet 1 tablet  1 tablet Oral Daily Sarina Ill, DO   1 tablet at 07/07/21 0810   nicotine (NICODERM CQ - dosed in mg/24 hours) patch 21 mg  21 mg Transdermal Daily Sarina Ill, DO   21 mg at 07/07/21 0811   OLANZapine (ZYPREXA) tablet 5 mg  5 mg Oral Q6H PRN Sarina Ill, DO       ondansetron [facility]) injection 4 mg  4 mg Intravenous Q8H PRN Sarina Ill, DO       polyethylene glycol (MIRALAX / GLYCOLAX) packet 17 g  17 g Oral Daily Sarina Ill, DO   17 g at 07/07/21 0810   risperiDONE (RISPERDAL) tablet 1 mg  1 mg Oral BID Sarina Ill, DO   1 mg at 07/07/21 5409   senna-docusate (Senokot-S) tablet 1 tablet  1 tablet Oral BID Sarina Ill, DO   1 tablet at 07/07/21 0810   traZODone (DESYREL) tablet 50 mg  50 mg Oral QHS Sarina Ill, DO   50 mg at 07/06/21 2210    Lab Results: No results found for this or any previous visit (from the past 48 hour(s)).  Blood Alcohol level:  Lab Results  Component Value Date   ETH <10 06/27/2021   ETH <10 05/14/2020    Metabolic Disorder Labs: Lab Results  Component Value Date   HGBA1C 5.8 (H) 05/18/2020   MPG 119.76 05/18/2020   No results found for: "PROLACTIN" Lab Results  Component Value Date   CHOL 173 05/18/2020   TRIG 224 (H) 05/18/2020   HDL 37 (L) 05/18/2020   CHOLHDL 4.7 05/18/2020   VLDL 45 (H) 05/18/2020   LDLCALC 91 05/18/2020    Physical Findings: AIMS:  , ,  ,  ,    CIWA:    COWS:     Musculoskeletal: Strength & Muscle Tone: within normal limits Gait & Station: unsteady Patient leans: N/A  Psychiatric Specialty Exam:  Presentation  General Appearance: Bizarre  Eye Contact:Fair  Speech:Normal Rate  Speech  Volume:Normal  Handedness:Right   Mood and Affect  Mood:Anxious; Depressed  Affect:Inappropriate; Full Range   Thought Process  Thought Processes:Disorganized  Descriptions of Associations:Tangential  Orientation:Full (Time, Place and Person)  Thought Content:Illogical; Tangential  History of Schizophrenia/Schizoaffective disorder:No  Duration of Psychotic Symptoms:Greater than six months  Hallucinations:No data recorded Ideas of Reference:No data recorded Suicidal Thoughts:No data recorded Homicidal Thoughts:No data recorded  Sensorium  Memory:Immediate Fair  Judgment:Impaired  Insight:Lacking   Executive Functions  Concentration:Poor  Attention Span:Poor  Recall:Poor  Fund of Knowledge:Poor  Language:Poor   Psychomotor Activity  Psychomotor Activity:No data recorded  Assets  Assets:Desire for Improvement; Talents/Skills; Housing; Social Support   Sleep  Sleep:No data recorded   Physical Exam: Physical Exam Vitals and nursing note reviewed.  Constitutional:      Appearance: Normal appearance. She is normal weight.  Neurological:     General: No focal deficit present.     Mental Status: She is alert and oriented to person, place, and time.  Psychiatric:        Attention and Perception: Attention and perception normal.        Mood and Affect: Mood is depressed. Affect is flat.        Speech: Speech normal.        Behavior: Behavior normal. Behavior is cooperative.        Thought Content: Thought content normal.        Cognition and Memory: Cognition and memory normal.        Judgment: Judgment normal.    Review of Systems  Constitutional: Negative.   HENT: Negative.    Eyes: Negative.   Respiratory: Negative.    Cardiovascular: Negative.   Gastrointestinal: Negative.   Genitourinary: Negative.   Musculoskeletal: Negative.   Skin: Negative.   Neurological: Negative.   Endo/Heme/Allergies: Negative.   Psychiatric/Behavioral:  The  patient has insomnia.    Blood pressure 119/70, pulse 86, temperature 98 F (36.7 C), resp. rate 18, height 5\' 8"  (1.727 m), weight 119.5 kg, SpO2 94 %. Body mass index is 40.07 kg/m.   Treatment Plan Summary: Daily contact with patient to assess and evaluate symptoms and progress in treatment, Medication management, and Plan continue current medications.  Consult physical therapy due to back pain and problems walking.  Sarina Ill, DO 07/07/2021, 1:09 PM

## 2021-07-07 NOTE — BH Assessment (Signed)
1920 Received patient sitting in the day room watching TV and interacting with peers. She did  complain of back pain 9/10  and requested and received Tylenol 650 mg.  2043 Patient is currently rating her pain at 6/10.  She is ambulating with her walker but occassionally is seen ambulating without it.   2215 Patient requested and received T Ativan 0.5 mg for anxiety for the lights being out. Patient reassured that the electric is being working on.   0617 Patient has remain asleep since  0300.

## 2021-07-07 NOTE — Progress Notes (Signed)
Patient denies SI, HI, and AVH. She endorses lower back pain, rated as an 8/10. Patient had received Tylenol at 537am, with no relief. Patient was observed to be holding onto the railings to walk. Patient was encouraged to use her walker to get around the unit. Patient was compliant with scheduled medications. She is observed to be interacting appropriately with staff and other patients. Patient remains safe on the unit at this time.

## 2021-07-08 NOTE — BHH Counselor (Signed)
CSW spoke with pt about concerns regarding bruising that she is concerned she received when she was in the ED or medical floor. CSW told pt to speak with the provider about concerns to better relay what scenarios occurred before pt arrived on the unit due to CSW not being aware about any situation and not able to provide clarity.    Derelle Cockrell [place], MSW, LCSW-A 6/28/20233:20 PM

## 2021-07-08 NOTE — BH IP Treatment Plan (Signed)
Interdisciplinary Treatment and Diagnostic Plan Update  07/08/2021 Time of Session: 10:00AM Anna Adkins MRN: [id number]  Principal Diagnosis: MDD (major depressive disorder), recurrent, severe, with psychosis (HCC)  Secondary Diagnoses: Principal Problem:   MDD (major depressive disorder), recurrent, severe, with psychosis (HCC)   Current Medications:  Current Facility-Administered Medications  Medication Dose Route Frequency Provider Last Rate Last Admin   acetaminophen (TYLENOL) tablet 650 mg  650 mg Oral Q6H PRN Sarina Ill, DO   650 mg at 07/08/21 0926   albuterol (PROVENTIL) (2.5 MG/3ML) 0.083% nebulizer solution 3 mL  3 mL Inhalation Q4H PRN Sarina Ill, DO   3 mL at 07/03/21 1218   alum & mag hydroxide-simeth (MAALOX/MYLANTA) 200-200-20 MG/5ML suspension 30 mL  30 mL Oral Q4H PRN Sarina Ill, DO   30 mL at 07/04/21 1831   apixaban (ELIQUIS) tablet 5 mg  5 mg Oral BID Sarina Ill, DO   5 mg at 07/08/21 9562   dextromethorphan-guaiFENesin (MUCINEX DM) 30-600 MG per 12 hr tablet 1 tablet  1 tablet Oral BID PRN Sarina Ill, DO   1 tablet at 07/07/21 1412   escitalopram (LEXAPRO) tablet 20 mg  20 mg Oral QHS Jaynie Bream, RPH   20 mg at 07/07/21 2148   loratadine (CLARITIN) tablet 10 mg  10 mg Oral Daily Sarina Ill, DO   10 mg at 07/08/21 1308   LORazepam (ATIVAN) tablet 0.5 mg  0.5 mg Oral Q4H PRN Sarina Ill, DO   0.5 mg at 07/08/21 6578   magnesium hydroxide (MILK OF MAGNESIA) suspension 30 mL  30 mL Oral Daily PRN Sarina Ill, DO       multivitamin with minerals tablet 1 tablet  1 tablet Oral Daily Sarina Ill, DO   1 tablet at 07/08/21 4696   nicotine (NICODERM CQ - dosed in mg/24 hours) patch 21 mg  21 mg Transdermal Daily Sarina Ill, DO   21 mg at 07/08/21 0926   OLANZapine (ZYPREXA) tablet 5 mg  5 mg Oral Q6H PRN Sarina Ill, DO        ondansetron [facility]) injection 4 mg  4 mg Intravenous Q8H PRN Sarina Ill, DO       polyethylene glycol (MIRALAX / GLYCOLAX) packet 17 g  17 g Oral Daily Sarina Ill, DO   17 g at 07/08/21 0929   risperiDONE (RISPERDAL) tablet 1 mg  1 mg Oral BID Sarina Ill, DO   1 mg at 07/08/21 2952   senna-docusate (Senokot-S) tablet 1 tablet  1 tablet Oral BID Sarina Ill, DO   1 tablet at 07/08/21 8413   traZODone (DESYREL) tablet 50 mg  50 mg Oral QHS Sarina Ill, DO   50 mg at 07/07/21 2149   PTA Medications: Medications Prior to Admission  Medication Sig Dispense Refill Last Dose   [EXPIRED] azithromycin (ZITHROMAX Z-PAK) 250 MG tablet Take 2 tablets (500 mg) on  Day 1,  followed by 1 tablet (250 mg) once daily on Days 2 through 5. 2 each 0    dextromethorphan-guaiFENesin (MUCINEX DM) 30-600 MG 12hr tablet Take 1 tablet by mouth 2 (two) times daily as needed for cough.      ELIQUIS 5 MG TABS tablet Take 5 mg by mouth 2 (two) times daily.      loratadine (CLARITIN) 10 MG tablet Take 10 mg by mouth daily.      Multiple Vitamin (MULTIVITAMIN WITH MINERALS)  TABS tablet Take 1 tablet by mouth daily.      nicotine (NICODERM CQ - DOSED IN MG/24 HOURS) 21 mg/24hr patch Place 1 patch (21 mg total) onto the skin daily. 28 patch 0    predniSONE (DELTASONE) 20 MG tablet Take 2 tablets (40 mg total) by mouth daily with breakfast. Take prednisone 40mg  po daily x2 days then stop.      risperiDONE (RISPERDAL) 1 MG tablet Take 1 tablet (1 mg total) by mouth 2 (two) times daily.      traZODone (DESYREL) 50 MG tablet Take 1 tablet (50 mg total) by mouth at bedtime. (Patient not taking: Reported on 06/27/2021) 30 tablet 0    VENTOLIN HFA 108 (90 Base) MCG/ACT inhaler Inhale 1-2 puffs into the lungs every 4 (four) hours as needed.       Patient Stressors: Health problems    Patient Strengths: Active sense of humor  Communication skills  Supportive family/friends    Treatment Modalities: Medication Management, Group therapy, Case management,  1 to 1 session with clinician, Psychoeducation, Recreational therapy.   Physician Treatment Plan for Primary Diagnosis: MDD (major depressive disorder), recurrent, severe, with psychosis (HCC) Long Term Goal(s): Improvement in symptoms so as ready for discharge   Short Term Goals: Ability to identify changes in lifestyle to reduce recurrence of condition will improve Ability to verbalize feelings will improve Ability to disclose and discuss suicidal ideas Ability to demonstrate self-control will improve Ability to identify and develop effective coping behaviors will improve Ability to maintain clinical measurements within normal limits will improve Compliance with prescribed medications will improve Ability to identify triggers associated with substance abuse/mental health issues will improve  Medication Management: Evaluate patient's response, side effects, and tolerance of medication regimen.  Therapeutic Interventions: 1 to 1 sessions, Unit Group sessions and Medication administration.  Evaluation of Outcomes: Adequate for Discharge  Physician Treatment Plan for Secondary Diagnosis: Principal Problem:   MDD (major depressive disorder), recurrent, severe, with psychosis (HCC)  Long Term Goal(s): Improvement in symptoms so as ready for discharge   Short Term Goals: Ability to identify changes in lifestyle to reduce recurrence of condition will improve Ability to verbalize feelings will improve Ability to disclose and discuss suicidal ideas Ability to demonstrate self-control will improve Ability to identify and develop effective coping behaviors will improve Ability to maintain clinical measurements within normal limits will improve Compliance with prescribed medications will improve Ability to identify triggers associated with substance abuse/mental health issues will improve     Medication  Management: Evaluate patient's response, side effects, and tolerance of medication regimen.  Therapeutic Interventions: 1 to 1 sessions, Unit Group sessions and Medication administration.  Evaluation of Outcomes: Adequate for Discharge   RN Treatment Plan for Primary Diagnosis: MDD (major depressive disorder), recurrent, severe, with psychosis (HCC) Long Term Goal(s): Knowledge of disease and therapeutic regimen to maintain health will improve  Short Term Goals: Ability to remain free from injury will improve, Ability to verbalize frustration and anger appropriately will improve, Ability to demonstrate self-control, Ability to participate in decision making will improve, Ability to verbalize feelings will improve, Ability to identify and develop effective coping behaviors will improve, and Compliance with prescribed medications will improve  Medication Management: RN will administer medications as ordered by provider, will assess and evaluate patient's response and provide education to patient for prescribed medication. RN will report any adverse and/or side effects to prescribing provider.  Therapeutic Interventions: 1 on 1 counseling sessions, Psychoeducation, Medication administration, Evaluate responses  to treatment, Monitor vital signs and CBGs as ordered, Perform/monitor CIWA, COWS, AIMS and Fall Risk screenings as ordered, Perform wound care treatments as ordered.  Evaluation of Outcomes: Adequate for Discharge   LCSW Treatment Plan for Primary Diagnosis: MDD (major depressive disorder), recurrent, severe, with psychosis (HCC) Long Term Goal(s): Safe transition to appropriate next level of care at discharge, Engage patient in therapeutic group addressing interpersonal concerns.  Short Term Goals: Engage patient in aftercare planning with referrals and resources, Increase social support, Increase ability to appropriately verbalize feelings, Increase emotional regulation, Facilitate  acceptance of mental health diagnosis and concerns, and Identify triggers associated with mental health/substance abuse issues  Therapeutic Interventions: Assess for all discharge needs, 1 to 1 time with Social worker, Explore available resources and support systems, Assess for adequacy in community support network, Educate family and significant other(s) on suicide prevention, Complete Psychosocial Assessment, Interpersonal group therapy.  Evaluation of Outcomes: Adequate for Discharge   Progress in Treatment: Attending groups: Yes. Participating in groups: Yes. Taking medication as prescribed: Yes. Toleration medication: Yes. Family/Significant other contact made: Yes, individual(s) contacted:  SPE completed with pt. CSW made 2 attempts with pt's daughter Lorre Opdahl  Patient understands diagnosis: Yes. Discussing patient identified problems/goals with staff: Yes. Medical problems stabilized or resolved: Yes. Denies suicidal/homicidal ideation: Yes. Issues/concerns per patient self-inventory: No. Other: None  New problem(s) identified: No, Describe:  None  New Short Term/Long Term Goal(s): Patient to work towards detox, elimination of symptoms of psychosis, medication management for mood stabilization; elimination of SI thoughts; development of comprehensive mental wellness/sobriety plan. Update 07/08/21: No changes at this time.    Patient Goals:  "work on my panic, hearing voices and stopping my depression" Update 07/08/21: No changes at this time.    Discharge Plan or Barriers: CSW will assist pt with development appropriate discharge/aftercare plan.  Update 07/08/21: No changes at this time.    Reason for Continuation of Hospitalization: Anxiety Depression Medication stabilization   Estimated Length of Stay: 1-7 days  Last 3 [place] Suicide Severity Risk Score: Flowsheet Row Admission (Current) from 07/02/2021 in [facility] ED to Hosp-Admission  (Discharged) from 06/27/2021 in [facility] (1A) [facility] (Discharged[facility]  C-SSRS RISK CATEGORY No Risk No Risk No Risk       Last PHQ 2/9 Scores:     No data to display          Scribe for Treatment Team: Gregery Walberg A [place], LCSWA 07/08/2021 10:54 AM

## 2021-07-08 NOTE — Progress Notes (Signed)
Patient A&Ox4. Patient given tylenol po prn this morning due to lower back pain which provided some relief. Patient also received ativan po prn due to anxiety. Patient stated sleeping well last night and has been sociable and playing games in day area. Patient joined peers outside today. Patient presents in euthymic mood. Spoke with daughter. Denies SI, HI, and A/V/H and looking forward to discharging.

## 2021-07-08 NOTE — Evaluation (Signed)
Physical Therapy Evaluation Patient Details Name: Anna Adkins MRN: [id number] DOB: 1965/09/17 Today's Date: 07/08/2021  History of Present Illness  56 year old white female who is voluntarily admitted to geriatric psychiatry on transfer from internal medicine.  She presented to the emergency room with her daughter after having a panic attack but was found to have bronchitis.  She does have a history of COPD.  She was admitted to the medicine and treated with antibiotics and nebulizers and transferred to psychiatry. Pt with chronic LBP that has been exacerbated with hosptialization and change in activity from her baseline.  Clinical Impression  Pt with improved back pain today (4/10) vs prior few days.  She states she did chair yoga yesterday with good results.  Discussed and performed HEP for gentle low back/LE stretches along with core strengthening/engagement basics.  Pt was able to ambulate w/o AD, though she showed increased confidence and speed with light single UE support (also brief trail with RW, though she did not require it today to ease back pain).  Discussed biomechanics of SPC use in L UE to ease R LE, pt engaged and open to the discussion. Pt appears near baseline, plan to return for one more visit to issue and review HEP, as well as trail actual SPC use in L UE.         Recommendations for follow up therapy are one component of a multi-disciplinary discharge planning process, led by the attending physician.  Recommendations may be updated based on patient status, additional functional criteria and insurance authorization.  Follow Up Recommendations No PT follow up (plan one more PT visit to issue and review HEP)      Assistance Recommended at Discharge PRN  Patient can return home with the following       Equipment Recommendations Rolling walker (2 wheels) (pt unsure if she currently has one in storage)  Recommendations for Other Services       Functional Status Assessment  Patient has had a recent decline in their functional status and demonstrates the ability to make significant improvements in function in a reasonable and predictable amount of time.     Precautions / Restrictions Precautions Precautions: Fall Restrictions Weight Bearing Restrictions: No      Mobility  Bed Mobility Overal bed mobility: Independent                  Transfers Overall transfer level: Modified independent Equipment used: None               General transfer comment: able to rise from multiple surfaces w/o assist, good use of UEs    Ambulation/Gait Ambulation/Gait assistance: Supervision Gait Distance (Feet): 200 Feet Assistive device: Rolling walker (2 wheels), 1 person hand held assist, None         General Gait Details: Pt with guarded but steady/safe ambulation.  She self selects using some UE support (hallway rail, reaching for PT's hand) but was able to ambulate w/o UEs with a slower more guarded cadence.  Pt reports improve pain today vs prior few days and PT educated and assessed ambulation with walker she did not necessarily need it this date.  Encouraged to use to relieve back pain pressure if more severe pain returns (here or at home)  Careers information officer    Modified Rankin (Stroke Patients Only)       Balance Overall balance assessment: Modified Independent  Pertinent Vitals/Pain Pain Assessment Pain Assessment: 0-10 Pain Score: 4  Pain Location: low back, PSIS area and into legs (R>L)    Home Living Family/patient expects to be discharged to:: Private residence Living Arrangements: Children;Alone (daughter to move in with her) Available Help at Discharge: Family;Available PRN/intermittently   Home Access: Stairs to enter Entrance Stairs-Rails:  (yes) Entrance Stairs-Number of Steps: 4     Home Equipment: Cane - single point (may have a  walker in storage, unsure?)      Prior Function Prior Level of Function : Independent/Modified Independent             Mobility Comments: Pt reports she uses her cane if she is walking long distances and/or hurting too much       Hand Dominance        Extremity/Trunk Assessment   Upper Extremity Assessment Upper Extremity Assessment: Generalized weakness    Lower Extremity Assessment Lower Extremity Assessment: Generalized weakness (R grossly 4-5, L 4/5)       Communication   Communication: No difficulties  Cognition Arousal/Alertness: Awake/alert Behavior During Therapy: WFL for tasks assessed/performed Overall Cognitive Status: Within Functional Limits for tasks assessed                                          General Comments General comments (skin integrity, edema, etc.): Educated and performed low back/LE stretches and educated on AD mechanics and appropriate use of cane vs walker going forward.  Pt able to tolerate mobility well with safe and appropriate effort.    Exercises     Assessment/Plan    PT Assessment Patient needs continued PT services  PT Problem List Decreased strength;Decreased range of motion;Decreased activity tolerance;Decreased mobility;Decreased balance;Decreased knowledge of use of DME;Decreased safety awareness;Other (comment)       PT Treatment Interventions Gait training;DME instruction;Functional mobility training;Therapeutic activities;Therapeutic exercise;Patient/family education    PT Goals (Current goals can be found in the Care Plan section)  Acute Rehab PT Goals Patient Stated Goal: go home PT Goal Formulation: With patient Time For Goal Achievement: 07/21/21 Potential to Achieve Goals: Good    Frequency Min 2X/week     Co-evaluation               AM-PAC PT "6 Clicks" Mobility  Outcome Measure Help needed turning from your back to your side while in a flat bed without using bedrails?:  None Help needed moving from lying on your back to sitting on the side of a flat bed without using bedrails?: None Help needed moving to and from a bed to a chair (including a wheelchair)?: None Help needed standing up from a chair using your arms (e.g., wheelchair or bedside chair)?: None Help needed to walk in hospital room?: A Little Help needed climbing 3-5 steps with a railing? : A Little 6 Click Score: 22    End of Session   Activity Tolerance: Patient tolerated treatment well Patient left: in bed   PT Visit Diagnosis: Unsteadiness on feet (R26.81);Muscle weakness (generalized) (M62.81);Pain Pain - Right/Left: Right Pain - part of body:  (lumbago)    Time: 1010-1035 PT Time Calculation (min) (ACUTE ONLY): 25 min   Charges:   PT Evaluation $PT Eval Low Complexity: 1 Low PT Treatments $Therapeutic Exercise: 8-22 mins        Malachi Pro, DPT 07/08/2021, 11:57 AM

## 2021-07-08 NOTE — Progress Notes (Signed)
[facility] MD Progress Note  07/08/2021 1:24 PM Anna Adkins  MRN:  [id number] Subjective: Anna Adkins is doing much better.  She is taking her medications as prescribed and denies any side effects.  Her mood and affect are improved.  She is tolerating the medications without any problem.  She is sleeping well.  Physical therapy came and saw her and went over some exercises for her back.  She states that she feels that she is ready to go tomorrow.  I told her that we are making an appointment for outpatient here at [facility].  Principal Problem: MDD (major depressive disorder), recurrent, severe, with psychosis (HCC) Diagnosis: Principal Problem:   MDD (major depressive disorder), recurrent, severe, with psychosis (HCC)  Total Time spent with patient: 15 minutes  Past Psychiatric History:  MDD with PF  Past Medical History:  Past Medical History:  Diagnosis Date   Anxiety    Anxiety    Arthritis    Depression    Depression    DVT (deep venous thrombosis) (HCC)    DVT of leg (deep venous thrombosis) (HCC) 2014   Pulmonary emboli (HCC)    TIA (transient ischemic attack) 2014    Past Surgical History:  Procedure Laterality Date   Blood clot removal     ECTOPIC PREGNANCY SURGERY     HERNIA REPAIR     HERNIA REPAIR  2001   SKIN GRAFT     TUBAL LIGATION     Family History:  Family History  Problem Relation Age of Onset   Healthy Mother    Heart disease Father    Diabetes Father    Asthma Father    Breast cancer Neg Hx     Social History:  Social History   Substance and Sexual Activity  Alcohol Use Yes   Alcohol/week: 1.0 standard drink of alcohol   Types: 1 Glasses of wine per week   Comment: occasional     Social History   Substance and Sexual Activity  Drug Use No    Social History   Socioeconomic History   Marital status: Single    Spouse name: Not on file   Number of children: 4   Years of education: Not on file   Highest education level: Not on file   Occupational History   Not on file  Tobacco Use   Smoking status: Every Day    Packs/day: 1.00    Years: 20.00    Total pack years: 20.00    Types: Cigarettes   Smokeless tobacco: Never  Vaping Use   Vaping Use: Never used  Substance and Sexual Activity   Alcohol use: Yes    Alcohol/week: 1.0 standard drink of alcohol    Types: 1 Glasses of wine per week    Comment: occasional   Drug use: No   Sexual activity: Not on file  Other Topics Concern   Not on file  Social History Narrative   Not on file   Social Determinants of Health   Financial Resource Strain: Not on file  Food Insecurity: Not on file  Transportation Needs: Not on file  Physical Activity: Not on file  Stress: Not on file  Social Connections: Not on file   Additional Social History:                         Sleep: Good  Appetite:  Good  Current Medications: Current Facility-Administered Medications  Medication Dose Route Frequency Provider Last Rate  Last Admin   acetaminophen (TYLENOL) tablet 650 mg  650 mg Oral Q6H PRN Sarina Ill, DO   650 mg at 07/08/21 0926   albuterol (PROVENTIL) (2.5 MG/3ML) 0.083% nebulizer solution 3 mL  3 mL Inhalation Q4H PRN Sarina Ill, DO   3 mL at 07/03/21 1218   alum & mag hydroxide-simeth (MAALOX/MYLANTA) 200-200-20 MG/5ML suspension 30 mL  30 mL Oral Q4H PRN Sarina Ill, DO   30 mL at 07/04/21 1831   apixaban (ELIQUIS) tablet 5 mg  5 mg Oral BID Sarina Ill, DO   5 mg at 07/08/21 0973   dextromethorphan-guaiFENesin (MUCINEX DM) 30-600 MG per 12 hr tablet 1 tablet  1 tablet Oral BID PRN Sarina Ill, DO   1 tablet at 07/07/21 1412   escitalopram (LEXAPRO) tablet 20 mg  20 mg Oral QHS Jaynie Bream, RPH   20 mg at 07/07/21 2148   loratadine (CLARITIN) tablet 10 mg  10 mg Oral Daily Sarina Ill, DO   10 mg at 07/08/21 5329   LORazepam (ATIVAN) tablet 0.5 mg  0.5 mg Oral Q4H PRN Sarina Ill, DO   0.5 mg at 07/08/21 9242   magnesium hydroxide (MILK OF MAGNESIA) suspension 30 mL  30 mL Oral Daily PRN Sarina Ill, DO       multivitamin with minerals tablet 1 tablet  1 tablet Oral Daily Sarina Ill, DO   1 tablet at 07/08/21 6834   nicotine (NICODERM CQ - dosed in mg/24 hours) patch 21 mg  21 mg Transdermal Daily Sarina Ill, DO   21 mg at 07/08/21 0926   OLANZapine (ZYPREXA) tablet 5 mg  5 mg Oral Q6H PRN Sarina Ill, DO       ondansetron [facility]) injection 4 mg  4 mg Intravenous Q8H PRN Sarina Ill, DO       polyethylene glycol (MIRALAX / GLYCOLAX) packet 17 g  17 g Oral Daily Sarina Ill, DO   17 g at 07/08/21 1962   risperiDONE (RISPERDAL) tablet 1 mg  1 mg Oral BID Sarina Ill, DO   1 mg at 07/08/21 2297   senna-docusate (Senokot-S) tablet 1 tablet  1 tablet Oral BID Sarina Ill, DO   1 tablet at 07/08/21 9892   traZODone (DESYREL) tablet 50 mg  50 mg Oral QHS Sarina Ill, DO   50 mg at 07/07/21 2149    Lab Results: No results found for this or any previous visit (from the past 48 hour(s)).  Blood Alcohol level:  Lab Results  Component Value Date   ETH <10 06/27/2021   ETH <10 05/14/2020    Metabolic Disorder Labs: Lab Results  Component Value Date   HGBA1C 5.8 (H) 05/18/2020   MPG 119.76 05/18/2020   No results found for: "PROLACTIN" Lab Results  Component Value Date   CHOL 173 05/18/2020   TRIG 224 (H) 05/18/2020   HDL 37 (L) 05/18/2020   CHOLHDL 4.7 05/18/2020   VLDL 45 (H) 05/18/2020   LDLCALC 91 05/18/2020    Physical Findings: AIMS:  , ,  ,  ,    CIWA:    COWS:     Musculoskeletal: Strength & Muscle Tone: within normal limits Gait & Station: normal Patient leans: N/A  Psychiatric Specialty Exam:  Presentation  General Appearance: Bizarre  Eye Contact:Fair  Speech:Normal Rate  Speech  Volume:Normal  Handedness:Right   Mood and Affect  Mood:Anxious; Depressed  Affect:Inappropriate; Full Range   Thought Process  Thought Processes:Disorganized  Descriptions of Associations:Tangential  Orientation:Full (Time, Place and Person)  Thought Content:Illogical; Tangential  History of Schizophrenia/Schizoaffective disorder:No  Duration of Psychotic Symptoms:Greater than six months  Hallucinations:No data recorded Ideas of Reference:No data recorded Suicidal Thoughts:No data recorded Homicidal Thoughts:No data recorded  Sensorium  Memory:Immediate Fair  Judgment:Impaired  Insight:Lacking   Executive Functions  Concentration:Poor  Attention Span:Poor  Recall:Poor  Fund of Knowledge:Poor  Language:Poor   Psychomotor Activity  Psychomotor Activity:No data recorded  Assets  Assets:Desire for Improvement; Talents/Skills; Housing; Social Support   Sleep  Sleep:No data recorded   Physical Exam: Physical Exam Vitals and nursing note reviewed.  Constitutional:      Appearance: Normal appearance. She is normal weight.  Neurological:     General: No focal deficit present.     Mental Status: She is alert and oriented to person, place, and time.  Psychiatric:        Attention and Perception: Attention and perception normal.        Mood and Affect: Mood and affect normal.        Speech: Speech normal.        Behavior: Behavior normal. Behavior is cooperative.        Thought Content: Thought content normal.        Cognition and Memory: Cognition and memory normal.        Judgment: Judgment normal.    Review of Systems  Constitutional: Negative.   HENT: Negative.    Eyes: Negative.   Respiratory: Negative.    Cardiovascular: Negative.   Gastrointestinal: Negative.   Genitourinary: Negative.   Musculoskeletal: Negative.   Skin: Negative.   Neurological: Negative.   Endo/Heme/Allergies: Negative.   Psychiatric/Behavioral: Negative.      Blood pressure 105/69, pulse 85, temperature (!) 97.5 F (36.4 C), resp. rate 20, height 5\' 8"  (1.727 m), weight 119.5 kg, SpO2 90 %. Body mass index is 40.07 kg/m.   Treatment Plan Summary: Daily contact with patient to assess and evaluate symptoms and progress in treatment, Medication management, and Plan continue current medications.  Discharge tomorrow.  Tekeshia Klahr , DO 07/08/2021, 1:24 PM

## 2021-07-08 NOTE — Plan of Care (Signed)
  Problem: Safety: Goal: Ability to remain free from injury will improve Outcome: Progressing   Problem: Education: Goal: Verbalization of understanding the information provided will improve Outcome: Progressing   Problem: Health Behavior/Discharge Planning: Goal: Compliance with treatment plan for underlying cause of condition will improve Outcome: Progressing   

## 2021-07-09 MED ORDER — ESCITALOPRAM OXALATE 20 MG PO TABS: 20.0000 mg | ORAL_TABLET | Freq: Every day | ORAL | 3 refills | Status: DC

## 2021-07-09 MED ORDER — RISPERIDONE 1 MG PO TABS: 1.0000 mg | ORAL_TABLET | Freq: Two times a day (BID) | ORAL | 3 refills | Status: DC

## 2021-07-09 MED ORDER — TRAZODONE HCL 50 MG PO TABS: 50.0000 mg | ORAL_TABLET | Freq: Every day | ORAL | 3 refills | Status: DC

## 2021-07-09 MED ORDER — LORAZEPAM 0.5 MG PO TABS: 0.5000 mg | ORAL_TABLET | ORAL | 0 refills | Status: DC | PRN

## 2021-07-09 MED ORDER — NICOTINE 21 MG/24HR TD PT24: 21.0000 mg | MEDICATED_PATCH | Freq: Every day | TRANSDERMAL | 0 refills | Status: DC

## 2021-07-09 NOTE — BHH Suicide Risk Assessment (Signed)
[facility] Discharge Suicide Risk Assessment   Principal Problem: MDD (major depressive disorder), recurrent, severe, with psychosis (HCC) Discharge Diagnoses: Principal Problem:   MDD (major depressive disorder), recurrent, severe, with psychosis (HCC)   Total Time spent with patient: 1 hour  Musculoskeletal: Strength & Muscle Tone: within normal limits Gait & Station: normal Patient leans: N/A  Psychiatric Specialty Exam  Presentation  General Appearance: Bizarre  Eye Contact:Fair  Speech:Normal Rate  Speech Volume:Normal  Handedness:Right   Mood and Affect  Mood:Anxious; Depressed  Duration of Depression Symptoms: No data recorded Affect:Inappropriate; Full Range   Thought Process  Thought Processes:Disorganized  Descriptions of Associations:Tangential  Orientation:Full (Time, Place and Person)  Thought Content:Illogical; Tangential  History of Schizophrenia/Schizoaffective disorder:No  Duration of Psychotic Symptoms:Greater than six months  Hallucinations:No data recorded Ideas of Reference:No data recorded Suicidal Thoughts:No data recorded Homicidal Thoughts:No data recorded  Sensorium  Memory:Immediate Fair  Judgment:Impaired  Insight:Lacking   Executive Functions  Concentration:Poor  Attention Span:Poor  Recall:Poor  Fund of Knowledge:Poor  Language:Poor   Psychomotor Activity  Psychomotor Activity:No data recorded  Assets  Assets:Desire for Improvement; Talents/Skills; Housing; Social Support   Sleep  Sleep:No data recorded  Physical Exam: Physical Exam Vitals and nursing note reviewed.  Constitutional:      Appearance: Normal appearance. She is normal weight.  Neurological:     General: No focal deficit present.     Mental Status: She is alert and oriented to person, place, and time.  Psychiatric:        Attention and Perception: Attention and perception normal.        Mood and Affect: Mood and affect normal.         Speech: Speech normal.        Behavior: Behavior normal. Behavior is cooperative.        Thought Content: Thought content normal.        Cognition and Memory: Cognition and memory normal.        Judgment: Judgment normal.    Review of Systems  Constitutional: Negative.   HENT: Negative.    Eyes: Negative.   Respiratory: Negative.    Cardiovascular: Negative.   Gastrointestinal: Negative.   Genitourinary: Negative.   Musculoskeletal: Negative.   Skin: Negative.   Neurological: Negative.   Endo/Heme/Allergies: Negative.   Psychiatric/Behavioral: Negative.     Blood pressure 110/78, pulse 77, temperature 98.4 F (36.9 C), resp. rate 18, height 5\' 8"  (1.727 m), weight 119.5 kg, SpO2 96 %. Body mass index is 40.07 kg/m.  Mental Status Per Nursing Assessment::   On Admission:  NA  Demographic Factors:  Caucasian  Loss Factors: NA  Historical Factors: Impulsivity  Risk Reduction Factors:   Positive social support  Continued Clinical Symptoms:  Depression:   Anhedonia  Cognitive Features That Contribute To Risk:  None    Suicide Risk:  Minimal: No identifiable suicidal ideation.  Patients presenting with no risk factors but with morbid ruminations; may be classified as minimal risk based on the severity of the depressive symptoms   Follow-up Information     [facility] Follow up on 08/10/2021.   Specialty: Behavioral Health Why: You have a follow up appointment scheduled with Dr. 08/12/2021 on Monday July 31st at 1pm. Please arrive at 12:45pm. Contact the office if you need to reschedule. Thanks! Contact information: [address] [facility] [place] [phone]  Plan Of Care/Follow-up recommendations: [facility]   Sarina Ill, DO 07/09/2021, 10:23 AM

## 2021-07-09 NOTE — Progress Notes (Signed)
Patient was cooperative with treatment, she voiced of upcoming discharge today and how she was looking forward too it. She had no new issues to report on shift at this time.

## 2021-07-09 NOTE — Progress Notes (Signed)
PT Cancellation Note  Patient Details Name: CHERYEL KYTE MRN: [id number] DOB: October 10, 1965   Cancelled Treatment:    Reason Eval/Treat Not Completed: Other (comment) Pt slated for d/c this date.  PT stopped by briefly to drop off physical copy of HEP that was discussed yesterday.  Reinforced explanation and answered questions.    Malachi Pro, DPT 07/09/2021, 2:34 PM

## 2021-07-09 NOTE — Progress Notes (Signed)
Patient is discharging at this time. Patient is A&Ox4. Vs stable. Patient denies SI,HI, and A/V/H with no plan/intent. Printed AVS reviewed with and given to patient along with medications and follow up appointments. Patient verbalized all understanding. All valuables/belongings returned to patient. Patient is being transported by her daughter. No s/s of current distress.

## 2021-07-09 NOTE — Care Management Important Message (Signed)
Important Message  Patient Details  Name: Anna Adkins MRN: [id number] Date of Birth: March 02, 1965   Medicare Important Message Given:  Yes     Mieka Leaton A [place], LCSWA 07/09/2021, 9:52 AM

## 2021-07-09 NOTE — Discharge Summary (Signed)
Physician Discharge Summary Note  Patient:  Anna Adkins is an 56 y.o., female MRN:  [id number] DOB:  [birth year] Patient phone:  [phone] (home)  Patient address:   [address] [place] [address],  Total Time spent with patient: 1 hour  Date of Admission:  07/02/2021 Date of Discharge: 07/09/2021  Reason for Admission:  Anna Adkins is a 56 year old white female who is voluntarily admitted to geriatric psychiatry on transfer from internal medicine.  She presented to the emergency room with her daughter after having a panic attack but was found to have bronchitis.  She does have a history of COPD.  She was admitted to the medicine and treated with antibiotics and nebulizers and transferred to psychiatry.  She was previously admitted to psychiatry in May 2022.  She endorses anhedonia, difficulty sleeping, panic attacks, anxiety, and depressed mood.  She currently lives in [place] by herself but states that her daughter will be moving in with her soon.  She cannot drive very far because of her panic attacks.  This has limited her access to mental health care.  She has been off of her medications because she has not seen a psychiatrist in a while.  She has been treated in [place], [place] and [place].  She does not know the names of the places that she has been.  We have restarted her psychiatric medications.  She finished her antibiotic yesterday.  She is able to contract for safety in the hospital.  She will need new appointments for psychiatric follow-up when she is discharged.  She also has a history of DVT and pulmonary emboli along with TIA.  Principal Problem: MDD (major depressive disorder), recurrent, severe, with psychosis (HCC) Discharge Diagnoses: Principal Problem:   MDD (major depressive disorder), recurrent, severe, with psychosis (HCC)   Past Psychiatric History: MDD with psychotic features, GAD, panic disorder  Past Medical History:  Past Medical History:   Diagnosis Date   Anxiety    Anxiety    Arthritis    Depression    Depression    DVT (deep venous thrombosis) (HCC)    DVT of leg (deep venous thrombosis) (HCC) 2014   Pulmonary emboli (HCC)    TIA (transient ischemic attack) 2014    Past Surgical History:  Procedure Laterality Date   Blood clot removal     ECTOPIC PREGNANCY SURGERY     HERNIA REPAIR     HERNIA REPAIR  2001   SKIN GRAFT     TUBAL LIGATION     Family History:  Family History  Problem Relation Age of Onset   Healthy Mother    Heart disease Father    Diabetes Father    Asthma Father    Breast cancer Neg Hx    Family Psychiatric  History: Unremarkable Social History:  Social History   Substance and Sexual Activity  Alcohol Use Yes   Alcohol/week: 1.0 standard drink of alcohol   Types: 1 Glasses of wine per week   Comment: occasional     Social History   Substance and Sexual Activity  Drug Use No    Social History   Socioeconomic History   Marital status: Single    Spouse name: Not on file   Number of children: 4   Years of education: Not on file   Highest education level: Not on file  Occupational History   Not on file  Tobacco Use   Smoking status: Every Day    Packs/day: 1.00  Years: 20.00    Total pack years: 20.00    Types: Cigarettes   Smokeless tobacco: Never  Vaping Use   Vaping Use: Never used  Substance and Sexual Activity   Alcohol use: Yes    Alcohol/week: 1.0 standard drink of alcohol    Types: 1 Glasses of wine per week    Comment: occasional   Drug use: No   Sexual activity: Not on file  Other Topics Concern   Not on file  Social History Narrative   Not on file   Social Determinants of Health   Financial Resource Strain: Not on file  Food Insecurity: Not on file  Transportation Needs: Not on file  Physical Activity: Not on file  Stress: Not on file  Social Connections: Not on file    Hospital Course: Barbera was admitted under routine orders and  precautions on a transfer from internal medicine after she presented to the emergency room with a panic attack and suicidal ideation.  She had superficially cut her wrist.  She was admitted to medicine for COPD and bronchitis.  She was treated and transferred to psychiatry.  While on the unit she was pleasant and cooperative.  Her Risperdal was titrated up to 1 mg twice a day and her Lexapro was restarted at 20 mg/day.  She was only taking 10 mg/day.  She was initiated on trazodone 50 mg at bedtime and Ativan 0.5 mg as needed for panic.  She had a history of DVT and over the weekend the covering doctor got an ultrasound because she was having some leg pain and it all came back negative.  She tolerated her medications without any problems.  She denies any side effects.  Her mood and affect improved and was felt that she maximized hospitalization.  She did not have a provider for many years so we set her up with [facility].  On the day of discharge she denied suicidal ideation, homicidal ideation, auditory or visual hallucinations.  Her judgment and insight were good.  Physical Findings: AIMS:  , ,  ,  ,    CIWA:    COWS:     Musculoskeletal: Strength & Muscle Tone: within normal limits Gait & Station: normal Patient leans: N/A   Psychiatric Specialty Exam:  Presentation  General Appearance: Bizarre  Eye Contact:Fair  Speech:Normal Rate  Speech Volume:Normal  Handedness:Right   Mood and Affect  Mood:Anxious; Depressed  Affect:Inappropriate; Full Range   Thought Process  Thought Processes:Disorganized  Descriptions of Associations:Tangential  Orientation:Full (Time, Place and Person)  Thought Content:Illogical; Tangential  History of Schizophrenia/Schizoaffective disorder:No  Duration of Psychotic Symptoms:Greater than six months  Hallucinations:No data recorded Ideas of Reference:No data recorded Suicidal Thoughts:No data recorded Homicidal  Thoughts:No data recorded  Sensorium  Memory:Immediate Fair  Judgment:Impaired  Insight:Lacking   Executive Functions  Concentration:Poor  Attention Span:Poor  Recall:Poor  Fund of Knowledge:Poor  Language:Poor   Psychomotor Activity  Psychomotor Activity:No data recorded  Assets  Assets:Desire for Improvement; Talents/Skills; Housing; Social Support   Sleep  Sleep:No data recorded   Physical Exam: Physical Exam Vitals and nursing note reviewed.  Constitutional:      Appearance: Normal appearance. She is normal weight.  Neurological:     General: No focal deficit present.     Mental Status: She is alert and oriented to person, place, and time.  Psychiatric:        Attention and Perception: Attention and perception normal.  Mood and Affect: Mood and affect normal.        Speech: Speech normal.        Behavior: Behavior normal. Behavior is cooperative.        Thought Content: Thought content normal.        Cognition and Memory: Cognition and memory normal.        Judgment: Judgment normal.    Review of Systems  Constitutional: Negative.   HENT: Negative.    Eyes: Negative.   Respiratory: Negative.    Cardiovascular: Negative.   Gastrointestinal: Negative.   Genitourinary: Negative.   Musculoskeletal: Negative.   Skin: Negative.   Neurological: Negative.   Endo/Heme/Allergies: Negative.   Psychiatric/Behavioral: Negative.     Blood pressure 110/78, pulse 77, temperature 98.4 F (36.9 C), resp. rate 18, height 5\' 8"  (1.727 m), weight 119.5 kg, SpO2 96 %. Body mass index is 40.07 kg/m.   Social History   Tobacco Use  Smoking Status Every Day   Packs/day: 1.00   Years: 20.00   Total pack years: 20.00   Types: Cigarettes  Smokeless Tobacco Never   Tobacco Cessation:  A prescription for an FDA-approved tobacco cessation medication provided at discharge   Blood Alcohol level:  Lab Results  Component Value Date   [facility] <10 06/27/2021    ETH <10 05/14/2020    Metabolic Disorder Labs:  Lab Results  Component Value Date   HGBA1C 5.8 (H) 05/18/2020   MPG 119.76 05/18/2020   No results found for: "PROLACTIN" Lab Results  Component Value Date   CHOL 173 05/18/2020   TRIG 224 (H) 05/18/2020   HDL 37 (L) 05/18/2020   CHOLHDL 4.7 05/18/2020   VLDL 45 (H) 05/18/2020   LDLCALC 91 05/18/2020    See Psychiatric Specialty Exam and Suicide Risk Assessment completed by Attending Physician prior to discharge.  Discharge destination:  Home  Is patient on multiple antipsychotic therapies at discharge:  No   Has Patient had three or more failed trials of antipsychotic monotherapy by history:  No  Recommended Plan for Multiple Antipsychotic Therapies: NA   Allergies as of 07/09/2021       Reactions   Sulfa Antibiotics Anaphylaxis        Medication List     STOP taking these medications    azithromycin 250 MG tablet Commonly known as: Zithromax Z-Pak   predniSONE 20 MG tablet Commonly known as: DELTASONE       TAKE these medications      Indication  dextromethorphan-guaiFENesin 30-600 MG 12hr tablet Commonly known as: MUCINEX DM Take 1 tablet by mouth 2 (two) times daily as needed for cough.    Eliquis 5 MG Tabs tablet Generic drug: apixaban Take 5 mg by mouth 2 (two) times daily.  Indication: Temporary Stroke   escitalopram 20 MG tablet Commonly known as: LEXAPRO Take 1 tablet (20 mg total) by mouth at bedtime.  Indication: Generalized Anxiety Disorder, Major Depressive Disorder   loratadine 10 MG tablet Commonly known as: CLARITIN Take 10 mg by mouth daily.  Indication: Hayfever   LORazepam 0.5 MG tablet Commonly known as: ATIVAN Take 1 tablet (0.5 mg total) by mouth every 4 (four) hours as needed for anxiety.    multivitamin with minerals Tabs tablet Take 1 tablet by mouth daily.  Indication: Nutritional Support   nicotine 21 mg/24hr patch Commonly known as: NICODERM CQ - dosed in  mg/24 hours Place 1 patch (21 mg total) onto the skin daily.  Indication: Nicotine Addiction  risperiDONE 1 MG tablet Commonly known as: RISPERDAL Take 1 tablet (1 mg total) by mouth 2 (two) times daily.  Indication: Major Depressive Disorder   traZODone 50 MG tablet Commonly known as: DESYREL Take 1 tablet (50 mg total) by mouth at bedtime.  Indication: Trouble Sleeping   Ventolin HFA 108 (90 Base) MCG/ACT inhaler Generic drug: albuterol Inhale 1-2 puffs into the lungs every 4 (four) hours as needed.  Indication: Asthma        Follow-up Information     [facility] Follow up on 08/10/2021.   Specialty: Behavioral Health Why: You have a follow up appointment scheduled with Dr. Vanetta Shawl on Monday July 31st at 1pm. Please arrive at 12:45pm. Contact the office if you need to reschedule. Thanks! Contact information: [address] [facility] [place] [address] [phone]                Follow-up recommendations:  [facility]  Signed: Sarina Ill, DO 07/09/2021, 10:34 AM

## 2021-07-09 NOTE — Progress Notes (Signed)
  [facility] Adult Case Management Discharge Plan :  Will you be returning to the same living situation after discharge:  Yes,  Pt will be returning home At discharge, do you have transportation home?: Yes,  pt's family member is providing transportation Do you have the ability to pay for your medications: Yes,  pt has [facility] Medicare  Release of information consent forms completed and in the chart;  Patient's signature needed at discharge.  Patient to Follow up at:  Follow-up Information     [facility] Follow up on 08/10/2021.   Specialty: Behavioral Health Why: You have a follow up appointment scheduled with Dr. Vanetta Shawl on Monday July 31st at 1pm. Please arrive at 12:45pm. Contact the office if you need to reschedule. Thanks! Contact information: [address] [facility] [place] [address] [phone]        [facility] Follow up on 08/31/2021.   Why: You have an appointment scheduled for therapy on Monday August  21st at [facility].  Please fill out initial appointment information before appointment. Thanks! Contact information: [address] [place] [address] [phone]         [facility] Follow up on 07/09/2021.   Specialty: Urgent Care Why: Please attend walk in hours from Monday or Wednesday at 7am to be seen by provider for crisis services if needed. You can schedule your appointment Monday July 3rd at 8am. Thanks! Contact information: [address] [place] [address] [phone]                Next level of care provider has access to [facility] Link:yes  Safety Planning and Suicide Prevention discussed: Yes,  SPE completed with pt     Has patient been referred to the Quitline?: Yes, faxed on 07/09/21  Patient has been referred for addiction treatment: N/A  Christy Friede A [place], LCSWA 07/09/2021, 1:41 PM

## 2021-07-15 ENCOUNTER — Other Ambulatory Visit: Payer: Self-pay | Admitting: Nurse Practitioner

## 2021-07-15 DIAGNOSIS — F17210 Nicotine dependence, cigarettes, uncomplicated: Secondary | ICD-10-CM

## 2021-07-15 DIAGNOSIS — J4530 Mild persistent asthma, uncomplicated: Secondary | ICD-10-CM

## 2021-07-21 ENCOUNTER — Other Ambulatory Visit: Payer: Self-pay | Admitting: Physician Assistant

## 2021-07-21 ENCOUNTER — Other Ambulatory Visit: Payer: Self-pay | Admitting: Nurse Practitioner

## 2021-07-21 DIAGNOSIS — Z1231 Encounter for screening mammogram for malignant neoplasm of breast: Secondary | ICD-10-CM

## 2021-08-06 NOTE — Progress Notes (Signed)
Psychiatric Initial Adult Assessment   Patient Identification: Anna Adkins MRN:  [id number] Date of Evaluation:  08/10/2021 Referral Source: Tawnya Crook*  Chief Complaint:   Chief Complaint  Patient presents with   Establish Care   Visit Diagnosis:    ICD-10-CM   1. MDD (major depressive disorder), recurrent, in partial remission (HCC)  F33.41       History of Present Illness:   Anna Adkins is a 56 y.o. year old female with a history of depression, pulmonary emboli, DVT, TIA, who is referred for depression.  - According to the chart review, she was admitted to [facility] in June 2023 for depression with psychotic features. "On interview today the patient says she is feeling a little better.  Slept better last night.  Denies suicidal or homicidal thoughts.  Patient mostly stayed coherent and rational during our conversation but had some rambling to her thoughts at times.  Seem to still hold some idiosyncratic believes.  She stated she thought that she might need to continue staying in the hospital on the psychiatric service a little longer although she is feeling better.  Denies any wish to get up and elope from the hospital.  After I left I am told the patient became more agitated started demanding that the sitter not leave the room.  I also see notes indicating she gets agitated sometimes at night and was singing inappropriately." - per chart review, she was admitted in May 2022 for depression. "The patient's mother felt as though the patient had been tangential and talking nonsense."  She states that she has been doing better since discharge from [facility].  She does not recall the part of the day she was in the hospital.  She was having panic attack when she tried to get things done at home when she went to the hospital.  She also states that her daughter brought her to the hospital as she was talking about things happened and years ago.  She adamantly denies any SI that time,  stating that she has her children and grandchildren.  She has been trying to keep herself busy.  She tries to have something to look forward to.  She states that her son is getting married.  She also celebrated her niece's graduation from college.  She is concerned about her mother, who will move in to retirement home.  Although they sold her mother's home with a plan to live with her sister and her aunt, her sister does not let her stay since her aunt passed away.  She reports great relationship with her children and grandchildren. She enjoyed going to a beach with her grandchildren.  She talks to the [employer], does devotion, and enjoys game on the phone.  She tries to go out deck in the porch. She enjoys meeting with her friends when she is not in much pain.   She denies feeling depressed.  She eats well.  She is not aware of any weight gain, and does not think the record is accurate.  She is willing to monitor this.  She feels anxious at times, and takes lorazepam as needed.  She denies SI, HI.  She denies hallucinations or paranoia.  She denies any side effect from the medication, and is willing to stay the same way.   Alcohol- cooler once a night, Mixed drink occasionally, denies drug use  Medication-lexapro 20 mg daily, risperidone 1 mg twice a day, lorazepam 0.5 mg qidprn for anxiety (takes twice a  day at maximum)   Support: Household:  twins Marital status: single, never married Number of children:4 Employment: on disability for DVT in 2014. She used to work at [employer] in 2013 (the job was "unbearable" to her) Education:  high school Last PCP / ongoing medical evaluation:    Wt Readings from Last 3 Encounters:  08/10/21 266 lb 12.8 oz (121 kg)  07/02/21 263 lb 8 oz (119.5 kg)  06/27/21 230 lb (104.3 kg)        Associated Signs/Symptoms: Depression Symptoms:  anxiety, (Hypo) Manic Symptoms:   denies decreased need for sleep, euphoria Anxiety Symptoms:  Panic Symptoms, Psychotic  Symptoms:   denies AH, VH, paranoia PTSD Symptoms: Negative  Past Psychiatric History:  Outpatient:  Psychiatry admission: [facility] in depression in July 2023, in 2022 Previous suicide attempt:  Past trials of medication:  History of violence:    Previous Psychotropic Medications: Yes   Substance Abuse History in the last 12 months:  No.  Consequences of Substance Abuse: NA  Past Medical History:  Past Medical History:  Diagnosis Date   Anxiety    Anxiety    Arthritis    Blood clot in vein    Depression    Depression    DVT (deep venous thrombosis) (HCC)    DVT of leg (deep venous thrombosis) (HCC) 2014   Pulmonary emboli (HCC)    Stroke (HCC)    TIA (transient ischemic attack) 2014    Past Surgical History:  Procedure Laterality Date   Blood clot removal     ECTOPIC PREGNANCY SURGERY     HERNIA REPAIR     HERNIA REPAIR  2001   SKIN GRAFT     TUBAL LIGATION      Family Psychiatric History: as below  Family History:  Family History  Problem Relation Age of Onset   Healthy Mother    Heart disease Father    Diabetes Father    Asthma Father    Breast cancer Neg Hx     Social History:   Social History   Socioeconomic History   Marital status: Single    Spouse name: Not on file   Number of children: 4   Years of education: Not on file   Highest education level: High school graduate  Occupational History   Not on file  Tobacco Use   Smoking status: Every Day    Packs/day: 1.00    Years: 20.00    Total pack years: 20.00    Types: Cigarettes   Smokeless tobacco: Never  Vaping Use   Vaping Use: Never used  Substance and Sexual Activity   Alcohol use: Yes    Alcohol/week: 4.0 standard drinks of alcohol    Types: 4 Standard drinks or equivalent per week    Comment: occasional   Drug use: No   Sexual activity: Not Currently  Other Topics Concern   Not on file  Social History Narrative   Not on file   Social Determinants of Health   Financial  Resource Strain: Not on file  Food Insecurity: Not on file  Transportation Needs: Not on file  Physical Activity: Not on file  Stress: Not on file  Social Connections: Not on file    Additional Social History: as above  Allergies:   Allergies  Allergen Reactions   Sulfa Antibiotics Anaphylaxis    Metabolic Disorder Labs: Lab Results  Component Value Date   HGBA1C 5.8 (H) 05/18/2020   MPG 119.76 05/18/2020  No results found for: "PROLACTIN" Lab Results  Component Value Date   CHOL 173 05/18/2020   TRIG 224 (H) 05/18/2020   HDL 37 (L) 05/18/2020   CHOLHDL 4.7 05/18/2020   VLDL 45 (H) 05/18/2020   LDLCALC 91 05/18/2020   Lab Results  Component Value Date   TSH 6.221 (H) 06/28/2021    Therapeutic Level Labs: No results found for: "LITHIUM" No results found for: "CBMZ" No results found for: "VALPROATE"  Current Medications: Current Outpatient Medications  Medication Sig Dispense Refill   ELIQUIS 5 MG TABS tablet Take 5 mg by mouth 2 (two) times daily.     escitalopram (LEXAPRO) 20 MG tablet Take 1 tablet (20 mg total) by mouth at bedtime. 30 tablet 3   fluticasone (FLONASE) 50 MCG/ACT nasal spray Place into both nostrils.     loratadine (CLARITIN) 10 MG tablet Take 10 mg by mouth daily.     LORazepam (ATIVAN) 0.5 MG tablet Take 1 tablet (0.5 mg total) by mouth every 4 (four) hours as needed for anxiety. 30 tablet 0   Multiple Vitamin (MULTIVITAMIN WITH MINERALS) TABS tablet Take 1 tablet by mouth daily.     risperiDONE (RISPERDAL) 1 MG tablet Take 1 tablet (1 mg total) by mouth 2 (two) times daily. 60 tablet 3   SYMBICORT 80-4.5 MCG/ACT inhaler Inhale into the lungs.     traZODone (DESYREL) 50 MG tablet Take 1 tablet (50 mg total) by mouth at bedtime. 30 tablet 3   triamcinolone (KENALOG) 0.025 % cream Apply topically.     VENTOLIN HFA 108 (90 Base) MCG/ACT inhaler Inhale 1-2 puffs into the lungs every 4 (four) hours as needed.     No current facility-administered  medications for this visit.    Musculoskeletal: Strength & Muscle Tone: within normal limits Gait & Station: normal Patient leans: N/A  Psychiatric Specialty Exam: Review of Systems  Psychiatric/Behavioral:  Negative for agitation, behavioral problems, confusion, decreased concentration, dysphoric mood, hallucinations, self-injury, sleep disturbance and suicidal ideas. The patient is nervous/anxious. The patient is not hyperactive.   All other systems reviewed and are negative.   Blood pressure 114/74, pulse 85, temperature 98.1 F (36.7 C), temperature source Temporal, weight 266 lb 12.8 oz (121 kg).Body mass index is 40.57 kg/m.  General Appearance: Fairly Groomed  Eye Contact:  Good  Speech:  Clear and Coherent  Volume:  Normal  Mood:   good  Affect:  Appropriate, Congruent, and calm, smiles  Thought Process:  Coherent  Orientation:  Full (Time, Place, and Person)  Thought Content:  Logical  Suicidal Thoughts:  No  Homicidal Thoughts:  No  Memory:  Immediate;   Good  Judgement:  Good  Insight:  Good  Psychomotor Activity:  Normal  Concentration:  Concentration: Good and Attention Span: Good  Recall:  Good  Fund of Knowledge:Good  Language: Good  Akathisia:  No  Handed:  Right  AIMS (if indicated):  not done  Assets:  Communication Skills Desire for Improvement  ADL's:  Intact  Cognition: WNL  Sleep:  Fair   Screenings: AUDIT    Flowsheet Row Admission (Discharged) from 07/02/2021 in [facility]  Alcohol Use Disorder Identification Test Final Score (AUDIT) 1 0      GAD-7    Flowsheet Row Office Visit from 08/10/2021 in [facility]  Total GAD-7 Score 7      PHQ2-9    Flowsheet Row Office  Visit from 08/10/2021 in [facility]  PHQ-2 Total Score 1      Flowsheet Row Office Visit from  08/10/2021 in [facility] Admission (Discharged) from 07/02/2021 in [facility] BEHAVIORAL MEDICINE ED to Hosp-Admission (Discharged) from 06/27/2021 in [facility] (1A)  C-SSRS RISK CATEGORY No Risk No Risk No Risk       Assessment and Plan:  ADALY PUDER is a 56 y.o. year old female with a history of depression, pulmonary emboli, DVT, TIA, who is referred for depression.   1. MDD (major depressive disorder), recurrent, in partial remission (HCC) There has been overall improvement in depressive symptoms and then anxiety since discharge from the [facility]/being on the current medication regimen.  Psychosocial stressors includes concerned about her mother, who will move to assisted living as her sister does not let her stay anymore, and empty nest related stress.  According to the chart review, she had psychotic symptoms of delusion, tangential thought process, which was not evident on today's evaluation.  Will continue current medication regimen; will continue Lexapro to target depression.  Will continue Risperdal to target depression and psychosis.  Discussed potential metabolic side effect and EPS.  Noted that she did had weight gain over the past few months, she is not aware of this, and does not think the record is accurate.  Will continue to monitor this. Will continue lorazepam prn for anxiety.  Discussed risk of dependence and oversedation.  She will greatly benefit from IOP; will make referral.   Plan Continue Lexapro 20 mg daily Continue Risperdal 1 mg twice a day- monitor weight gain Continue lorazepam 0.5 mg twice a day as needed for anxiety Referred to IOP Next appointment: 8/31 at 11:30 for 30 mins, in person  The patient demonstrates the following risk factors for suicide: Chronic risk factors for suicide include: psychiatric disorder of depression . Acute risk factors for suicide include: family or marital conflict,  unemployment, social withdrawal/isolation, and recent discharge from inpatient psychiatry. Protective factors for this patient include: positive social support, coping skills, and hope for the future. Considering these factors, the overall suicide risk at this point appears to be low. Patient is appropriate for outpatient follow up.   The duration of this appointment visit was 45 minutes of face-to-face time with the patient.  Greater than 50% of this time was spent in counseling, explanation of  diagnosis, planning of further management, and coordination of care.    Collaboration of Care: Other N/A  Patient/Guardian was advised Release of Information must be obtained prior to any record release in order to collaborate their care with an outside provider. Patient/Guardian was advised if they have not already done so to contact the registration department to sign all necessary forms in order for [place] to release information regarding their care.   Consent: Patient/Guardian gives verbal consent for treatment and assignment of benefits for services provided during this visit. Patient/Guardian expressed understanding and agreed to proceed.   Neysa Hotter, MD 7/31/20232:02 PM

## 2021-08-10 ENCOUNTER — Encounter: Payer: Self-pay | Admitting: Psychiatry

## 2021-08-10 ENCOUNTER — Ambulatory Visit (INDEPENDENT_AMBULATORY_CARE_PROVIDER_SITE_OTHER): Payer: Medicare Other | Admitting: Psychiatry

## 2021-08-10 VITALS — BP 114/74 | HR 85 | Temp 98.1°F | Wt 266.8 lb

## 2021-08-10 DIAGNOSIS — F3341 Major depressive disorder, recurrent, in partial remission: Secondary | ICD-10-CM | POA: Diagnosis not present

## 2021-08-11 ENCOUNTER — Telehealth (HOSPITAL_COMMUNITY): Payer: Self-pay | Admitting: Psychiatry

## 2021-08-13 ENCOUNTER — Ambulatory Visit
Admission: RE | Admit: 2021-08-13 | Discharge: 2021-08-13 | Disposition: A | Discharge status: Home / Self Care | Payer: Medicare Other | Source: Ambulatory Visit | Attending: Nurse Practitioner | Admitting: Nurse Practitioner

## 2021-08-13 DIAGNOSIS — Z1231 Encounter for screening mammogram for malignant neoplasm of breast: Secondary | ICD-10-CM | POA: Diagnosis present

## 2021-09-09 NOTE — Progress Notes (Unsigned)
BH MD/PA/NP OP Progress Note  09/10/2021 12:11 PM Anna Adkins  MRN:  [id number]  Chief Complaint:  Chief Complaint  Patient presents with   Medication Refill   HPI:  This is a follow-up appointment for depression and anxiety. She states that she has been doing well.  Although she feels nervous about the upcoming son's wedding in September, she thinks it is what is expected.  She tends to feel powerless now that her children have their own life.  It is hard to let go.  However, she has been more communicative with her daughter, and a few good about her children growing up.  She enjoys coloring.  She tries to make sure have something to look forward to.  She has been doing 15 minutes body movement twice a day to be prepared for the wedding.  She gave up soda. She feels good to be able talk about things honestly during the visit. The patient has mood symptoms as in PHQ-9/GAD-7.  She denies SI.  Although she continues to feel anxious, she does not feel fidgety, and feels better. She denies AH, Vh, paranoia.  She feels comfortable to stay on the current medication regimen.   Support: Household:  twins Marital status: single, never married Number of children:4 Employment: on disability for DVT in 2014. She used to work at [employer] in 2013 (the job was "unbearable" to her) Education:  high school Last PCP / ongoing medical evaluation:    Wt Readings from Last 3 Encounters:  09/10/21 265 lb 3.2 oz (120.3 kg)  08/10/21 266 lb 12.8 oz (121 kg)  07/02/21 263 lb 8 oz (119.5 kg)     Visit Diagnosis:    ICD-10-CM   1. MDD (major depressive disorder), recurrent, in partial remission (HCC)  F33.41     2. Anxiety state  F41.1       Past Psychiatric History: Please see initial evaluation for full details. I have reviewed the history. No updates at this time.     Past Medical History:  Past Medical History:  Diagnosis Date   Anxiety    Anxiety    Arthritis    Blood clot in vein     Depression    Depression    DVT (deep venous thrombosis) (HCC)    DVT of leg (deep venous thrombosis) (HCC) 2014   Pulmonary emboli (HCC)    Stroke (HCC)    TIA (transient ischemic attack) 2014    Past Surgical History:  Procedure Laterality Date   Blood clot removal     ECTOPIC PREGNANCY SURGERY     HERNIA REPAIR     HERNIA REPAIR  2001   SKIN GRAFT     TUBAL LIGATION      Family Psychiatric History: Please see initial evaluation for full details. I have reviewed the history. No updates at this time.     Family History:  Family History  Problem Relation Age of Onset   Healthy Mother    Heart disease Father    Diabetes Father    Asthma Father    Breast cancer Neg Hx     Social History:  Social History   Socioeconomic History   Marital status: Single    Spouse name: Not on file   Number of children: 4   Years of education: Not on file   Highest education level: High school graduate  Occupational History   Not on file  Tobacco Use   Smoking status: Every Day  Packs/day: 1.00    Years: 20.00    Total pack years: 20.00    Types: Cigarettes   Smokeless tobacco: Never  Vaping Use   Vaping Use: Never used  Substance and Sexual Activity   Alcohol use: Yes    Alcohol/week: 4.0 standard drinks of alcohol    Types: 4 Standard drinks or equivalent per week    Comment: occasional   Drug use: No   Sexual activity: Not Currently  Other Topics Concern   Not on file  Social History Narrative   Not on file   Social Determinants of Health   Financial Resource Strain: Not on file  Food Insecurity: Not on file  Transportation Needs: Not on file  Physical Activity: Not on file  Stress: Not on file  Social Connections: Not on file    Allergies:  Allergies  Allergen Reactions   Sulfa Antibiotics Anaphylaxis    Metabolic Disorder Labs: Lab Results  Component Value Date   HGBA1C 5.8 (H) 05/18/2020   MPG 119.76 05/18/2020   No results found for:  "PROLACTIN" Lab Results  Component Value Date   CHOL 173 05/18/2020   TRIG 224 (H) 05/18/2020   HDL 37 (L) 05/18/2020   CHOLHDL 4.7 05/18/2020   VLDL 45 (H) 05/18/2020   LDLCALC 91 05/18/2020   Lab Results  Component Value Date   TSH 6.221 (H) 06/28/2021   TSH 3.038 05/17/2020    Therapeutic Level Labs: No results found for: "LITHIUM" No results found for: "VALPROATE" No results found for: "CBMZ"  Current Medications: Current Outpatient Medications  Medication Sig Dispense Refill   ELIQUIS 5 MG TABS tablet Take 5 mg by mouth 2 (two) times daily.     fluticasone (FLONASE) 50 MCG/ACT nasal spray Place into both nostrils.     loratadine (CLARITIN) 10 MG tablet Take 10 mg by mouth daily.     LORazepam (ATIVAN) 0.5 MG tablet Take 1 tablet (0.5 mg total) by mouth every 4 (four) hours as needed for anxiety. 30 tablet 0   Multiple Vitamin (MULTIVITAMIN WITH MINERALS) TABS tablet Take 1 tablet by mouth daily.     SYMBICORT 80-4.5 MCG/ACT inhaler Inhale into the lungs.     traZODone (DESYREL) 50 MG tablet Take 1 tablet (50 mg total) by mouth at bedtime. 30 tablet 3   triamcinolone (KENALOG) 0.025 % cream Apply topically.     VENTOLIN HFA 108 (90 Base) MCG/ACT inhaler Inhale 1-2 puffs into the lungs every 4 (four) hours as needed.     [START ON 10/09/2021] escitalopram (LEXAPRO) 20 MG tablet Take 1 tablet (20 mg total) by mouth at bedtime. 90 tablet 0   [START ON 10/09/2021] risperiDONE (RISPERDAL) 1 MG tablet Take 1 tablet (1 mg total) by mouth 2 (two) times daily. 180 tablet 0   No current facility-administered medications for this visit.     Musculoskeletal: Strength & Muscle Tone: within normal limits Gait & Station: normal Patient leans: N/A  Psychiatric Specialty Exam: Review of Systems  Psychiatric/Behavioral:  Positive for dysphoric mood. Negative for agitation, behavioral problems, confusion, decreased concentration, hallucinations, self-injury, sleep disturbance and  suicidal ideas. The patient is nervous/anxious. The patient is not hyperactive.   All other systems reviewed and are negative.   Blood pressure 114/79, pulse 80, temperature 97.9 F (36.6 C), temperature source Temporal, weight 265 lb 3.2 oz (120.3 kg).Body mass index is 40.32 kg/m.  General Appearance: Fairly Groomed  Eye Contact:  Good  Speech:  Clear and Coherent  Volume:  Normal  Mood:   better  Affect:  Appropriate, Congruent, and Full Range  Thought Process:  Coherent  Orientation:  Full (Time, Place, and Person)  Thought Content: Logical   Suicidal Thoughts:  No  Homicidal Thoughts:  No  Memory:  Immediate;   Good  Judgement:  Good  Insight:  Good  Psychomotor Activity:  Normal  Concentration:  Concentration: Good and Attention Span: Good  Recall:  Good  Fund of Knowledge: Good  Language: Good  Akathisia:  No  Handed:  Right  AIMS (if indicated): not done  Assets:  Communication Skills Desire for Improvement  ADL's:  Intact  Cognition: WNL  Sleep:  Good   Screenings: AUDIT    Flowsheet Row Admission (Discharged) from 07/02/2021 in [facility] BEHAVIORAL MEDICINE Admission (Discharged) [facility]  Alcohol Use Disorder Identification Test Final Score (AUDIT) 1 0      GAD-7    Flowsheet Row Office Visit from 09/10/2021 in [facility] Office Visit from 08/10/2021 in [facility]  Total GAD-7 Score 7 7      PHQ2-9    Flowsheet Row Office Visit from 09/10/2021 in [facility] Office Visit from 08/10/2021 in [facility]  PHQ-2 Total Score 2 1  PHQ-9 Total Score 8 --      Flowsheet Row Office Visit from 09/10/2021 in [facility] Office Visit from 08/10/2021 in [facility] Admission (Discharged) from 07/02/2021 in [facility] BEHAVIORAL  MEDICINE  C-SSRS RISK CATEGORY No Risk No Risk No Risk        Assessment and Plan:  KENYONNA JASPER is a 56 y.o. year old female with a history of  depression, pulmonary emboli, DVT, TIA, who presents for follow up appointment for below.   MDD (major depressive disorder), recurrent, in partial remission (HCC) Anxiety state There has been a steady improvement in depressive symptoms and anxiety since discharge from [facility]. Psychosocial stressors includes concerned about her mother, who will move to assisted living as her sister does not let her stay anymore, and empty nest related stress.  According to the chart review, she had psychotic symptoms of delusion, tangential thought process, which she does not demonstrate since the initial visit.  Will continue Lexapro to target depression and anxiety.  Will continue Risperdal to target depression/psych symptoms.  Will continue lorazepam as needed for anxiety.      Plan (she would like medication to be sent [facility] after she finishes up remaining refills) Continue Lexapro 20 mg daily  Continue Risperdal 1 mg twice a day- monitor weight gain - 8/29  Continue lorazepam 0.5 mg twice a day as needed for anxiety (she declined a refill) Next appointment: 10/26 at 11:30 for 30 mins, in person   The patient demonstrates the following risk factors for suicide: Chronic risk factors for suicide include: psychiatric disorder of depression . Acute risk factors for suicide include: family or marital conflict, unemployment, social withdrawal/isolation, and recent discharge from inpatient psychiatry. Protective factors for this patient include: positive social support, coping skills, and hope for the future. Considering these factors, the overall suicide risk at this point appears to be low. Patient is appropriate for outpatient follow up.     This clinician has discussed the side effect associated with medication prescribed during this encounter. Please refer to notes  in the previous encounters for more details.    Collaboration of Care: Collaboration of  Care: Other N/A  Patient/Guardian was advised Release of Information must be obtained prior to any record release in order to collaborate their care with an outside provider. Patient/Guardian was advised if they have not already done so to contact the registration department to sign all necessary forms in order for [place] to release information regarding their care.   Consent: Patient/Guardian gives verbal consent for treatment and assignment of benefits for services provided during this visit. Patient/Guardian expressed understanding and agreed to proceed.    Neysa Hotter, MD 09/10/2021, 12:11 PM

## 2021-09-10 ENCOUNTER — Encounter: Payer: Self-pay | Admitting: Psychiatry

## 2021-09-10 ENCOUNTER — Ambulatory Visit (INDEPENDENT_AMBULATORY_CARE_PROVIDER_SITE_OTHER): Payer: Medicare Other | Admitting: Psychiatry

## 2021-09-10 VITALS — BP 114/79 | HR 80 | Temp 97.9°F | Wt 265.2 lb

## 2021-09-10 DIAGNOSIS — F3341 Major depressive disorder, recurrent, in partial remission: Secondary | ICD-10-CM | POA: Diagnosis not present

## 2021-09-10 DIAGNOSIS — F411 Generalized anxiety disorder: Secondary | ICD-10-CM | POA: Diagnosis not present

## 2021-09-10 MED ORDER — RISPERIDONE 1 MG PO TABS: 1.0000 mg | ORAL_TABLET | Freq: Two times a day (BID) | ORAL | 0 refills | Status: DC

## 2021-09-10 MED ORDER — ESCITALOPRAM OXALATE 20 MG PO TABS: 20.0000 mg | ORAL_TABLET | Freq: Every day | ORAL | 0 refills | Status: DC

## 2021-11-03 NOTE — Progress Notes (Unsigned)
BH MD/PA/NP OP Progress Note  11/05/2021 12:05 PM Anna Adkins  MRN:  [id number]  Chief Complaint:  Chief Complaint  Patient presents with   Follow-up   Depression   HPI:  This is a follow-up appointment for depression and anxiety.  She states that the wedding is over.  It was very good.  She was able to sleep the following night, and feels relieved.  She feels happy for him now that her son is married.  She is trying to leave for herself.  She has been trying to do 1 thing a different, and not hard on herself.  She enjoys going outside, and watching movies.  She is hoping to work on the exercise.  Her daughter moved in to her house, and she feels good about this.  Although she feels occasionally down, referring to the loss of her aunt last year, she is able to come out of it.  She also feels good about helping her mother moving into the place. The patient has mood symptoms as in PHQ-9/GAD-7.  She feels anxious especially when she drives.  She has not used lorazepam since the last visit she denies SI, HI.  She denies AH except hearing some music before going to bed.  She denies VH.  She denies paranoia.  She rarely drinks alcohol.  She denies drug use.    Support: Household:  daughter Marital status: single, never married Number of children:4 Employment: on disability for DVT in 2014. She used to work at [employer] in 2013 (the job was "unbearable" to her) Education:  high school Last PCP / ongoing medical evaluation:   Wt Readings from Last 3 Encounters:  11/05/21 267 lb 12.8 oz (121.5 kg)  09/10/21 265 lb 3.2 oz (120.3 kg)  08/10/21 266 lb 12.8 oz (121 kg)     Visit Diagnosis:    ICD-10-CM   1. MDD (major depressive disorder), recurrent episode, mild (HCC)  F33.0     2. Anxiety state  F41.1       Past Psychiatric History: Please see initial evaluation for full details. I have reviewed the history. No updates at this time.     Past Medical History:  Past Medical History:   Diagnosis Date   Anxiety    Anxiety    Arthritis    Blood clot in vein    Depression    Depression    DVT (deep venous thrombosis) (HCC)    DVT of leg (deep venous thrombosis) (HCC) 2014   Pulmonary emboli (HCC)    Stroke (HCC)    TIA (transient ischemic attack) 2014    Past Surgical History:  Procedure Laterality Date   Blood clot removal     ECTOPIC PREGNANCY SURGERY     HERNIA REPAIR     HERNIA REPAIR  2001   SKIN GRAFT     TUBAL LIGATION      Family Psychiatric History: Please see initial evaluation for full details. I have reviewed the history. No updates at this time.     Family History:  Family History  Problem Relation Age of Onset   Healthy Mother    Heart disease Father    Diabetes Father    Asthma Father    Breast cancer Neg Hx     Social History:  Social History   Socioeconomic History   Marital status: Single    Spouse name: Not on file   Number of children: 4   Years of education: Not on  file   Highest education level: High school graduate  Occupational History   Not on file  Tobacco Use   Smoking status: Every Day    Packs/day: 1.00    Years: 20.00    Total pack years: 20.00    Types: Cigarettes   Smokeless tobacco: Never  Vaping Use   Vaping Use: Never used  Substance and Sexual Activity   Alcohol use: Yes    Alcohol/week: 4.0 standard drinks of alcohol    Types: 4 Standard drinks or equivalent per week    Comment: occasional   Drug use: No   Sexual activity: Not Currently  Other Topics Concern   Not on file  Social History Narrative   Not on file   Social Determinants of Health   Financial Resource Strain: Not on file  Food Insecurity: Not on file  Transportation Needs: Not on file  Physical Activity: Not on file  Stress: Not on file  Social Connections: Not on file    Allergies:  Allergies  Allergen Reactions   Sulfa Antibiotics Anaphylaxis    Metabolic Disorder Labs: Lab Results  Component Value Date   HGBA1C  5.8 (H) 05/18/2020   MPG 119.76 05/18/2020   No results found for: "PROLACTIN" Lab Results  Component Value Date   CHOL 173 05/18/2020   TRIG 224 (H) 05/18/2020   HDL 37 (L) 05/18/2020   CHOLHDL 4.7 05/18/2020   VLDL 45 (H) 05/18/2020   LDLCALC 91 05/18/2020   Lab Results  Component Value Date   TSH 6.221 (H) 06/28/2021   TSH 3.038 05/17/2020    Therapeutic Level Labs: No results found for: "LITHIUM" No results found for: "VALPROATE" No results found for: "CBMZ"  Current Medications: Current Outpatient Medications  Medication Sig Dispense Refill   ELIQUIS 5 MG TABS tablet Take 5 mg by mouth 2 (two) times daily.     fluticasone (FLONASE) 50 MCG/ACT nasal spray Place into both nostrils.     loratadine (CLARITIN) 10 MG tablet Take 10 mg by mouth daily.     LORazepam (ATIVAN) 0.5 MG tablet Take 1 tablet (0.5 mg total) by mouth every 4 (four) hours as needed for anxiety. 30 tablet 0   Multiple Vitamin (MULTIVITAMIN WITH MINERALS) TABS tablet Take 1 tablet by mouth daily.     SYMBICORT 80-4.5 MCG/ACT inhaler Inhale into the lungs.     traZODone (DESYREL) 50 MG tablet Take 1 tablet (50 mg total) by mouth at bedtime. 30 tablet 3   triamcinolone (KENALOG) 0.025 % cream Apply topically.     VENTOLIN HFA 108 (90 Base) MCG/ACT inhaler Inhale 1-2 puffs into the lungs every 4 (four) hours as needed.     [START ON 01/08/2022] escitalopram (LEXAPRO) 20 MG tablet Take 1 tablet (20 mg total) by mouth at bedtime. 90 tablet 1   [START ON 01/08/2022] risperiDONE (RISPERDAL) 1 MG tablet Take 1 tablet (1 mg total) by mouth 2 (two) times daily. 180 tablet 0   No current facility-administered medications for this visit.     Musculoskeletal: Strength & Muscle Tone: within normal limits Gait & Station: normal Patient leans: N/A  Psychiatric Specialty Exam: Review of Systems  Psychiatric/Behavioral:  Positive for dysphoric mood. Negative for agitation, behavioral problems, confusion, decreased  concentration, hallucinations, self-injury, sleep disturbance and suicidal ideas. The patient is nervous/anxious. The patient is not hyperactive.   All other systems reviewed and are negative.   Blood pressure 100/67, pulse 97, temperature 98.3 F (36.8 C), temperature source Oral, height  5\' 8"  (1.727 m), weight 267 lb 12.8 oz (121.5 kg).Body mass index is 40.72 kg/m.  General Appearance: Fairly Groomed  Eye Contact:  Good  Speech:  Clear and Coherent  Volume:  Normal  Mood:   good  Affect:  Appropriate, Congruent, and Full Range  Thought Process:  Coherent  Orientation:  Full (Time, Place, and Person)  Thought Content: Logical   Suicidal Thoughts:  No  Homicidal Thoughts:  No  Memory:  Immediate;   Good  Judgement:  Good  Insight:  Good  Psychomotor Activity:  Normal, no tremors, rigidity, dyskinesia  Concentration:  Concentration: Good and Attention Span: Good  Recall:  Good  Fund of Knowledge: Good  Language: Good  Akathisia:  No  Handed:  Right  AIMS (if indicated): not done  Assets:  Communication Skills Desire for Improvement  ADL's:  Intact  Cognition: WNL  Sleep:  Good   Screenings: AUDIT    Flowsheet Row Admission (Discharged) from 07/02/2021 in [facility] Admission (Discharged) from 05/16/2020 in [facility]  Alcohol Use Disorder Identification Test Final Score (AUDIT) 1 0      GAD-7    Flowsheet Row Office Visit from 11/05/2021 in [place] Visit from 09/10/2021 in [place] Office Visit from 08/10/2021 in [place]  Total GAD-7 Score 2 7 7       PHQ2-9    [place] Visit from 11/05/2021 in [place] Office Visit from 09/10/2021 in [place] Office Visit from 08/10/2021 in [place]  PHQ-2 Total Score 1 2  1   PHQ-9 Total Score 5 8 --      [place] Office Visit from 11/05/2021 in [place] Office Visit from 09/10/2021 in [place] Office Visit from 08/10/2021 in [place] No Risk No Risk No Risk        Assessment and Plan:  JUNETTE BERNAT is a 56 y.o. year old female with a history of depression, pulmonary emboli, DVT, TIA, who presents for follow up appointment for below.   1. MDD (major depressive disorder), recurrent episode, mild ([place]) 2. Anxiety state There has been a steady improvement in depressive symptoms and anxiety since discharge from [facility]. Psychosocial stressors includes concerned about her mother, who will move to assisted living as her sister does not let her stay anymore, and empty nest related stress.  According to the chart review, she had psychotic symptoms of delusion, tangential thought process, which she does not demonstrate since the initial visit.  Will continue current dose of Lexapro to target depression and then anxiety.  Will can do Risperdal to target depression and psychotic symptoms at this time.  Will continue lorazepam as needed for anxiety.      Plan  Continue Lexapro 20 mg daily  Continue Risperdal 1 mg twice a day- monitor weight gain  Continue lorazepam 0.5 mg twice a day as needed for anxiety (she declined a refill) Next appointment: 1/2 at 50 AM for 30 mins, in person   The patient demonstrates the following risk factors for suicide: Chronic risk factors for suicide include: psychiatric disorder of depression . Acute risk factors for suicide include: family or marital conflict, unemployment, social withdrawal/isolation, and recent discharge from inpatient psychiatry. Protective factors for this patient include: positive social support, coping skills, and hope for the future. Considering these factors,  the overall suicide risk at this point  appears to be low. Patient is appropriate for outpatient follow up.     Collaboration of Care: Collaboration of Care: Other reviewed notes in Epic  Patient/Guardian was advised Release of Information must be obtained prior to any record release in order to collaborate their care with an outside provider. Patient/Guardian was advised if they have not already done so to contact the registration department to sign all necessary forms in order for [place] to release information regarding their care.   Consent: Patient/Guardian gives verbal consent for treatment and assignment of benefits for services provided during this visit. Patient/Guardian expressed understanding and agreed to proceed.    Neysa Hotter, MD 11/05/2021, 12:05 PM

## 2021-11-05 ENCOUNTER — Ambulatory Visit (INDEPENDENT_AMBULATORY_CARE_PROVIDER_SITE_OTHER): Payer: Medicare Other | Admitting: Psychiatry

## 2021-11-05 ENCOUNTER — Encounter: Payer: Self-pay | Admitting: Psychiatry

## 2021-11-05 VITALS — BP 100/67 | HR 97 | Temp 98.3°F | Ht 68.0 in | Wt 267.8 lb

## 2021-11-05 DIAGNOSIS — F330 Major depressive disorder, recurrent, mild: Secondary | ICD-10-CM

## 2021-11-05 DIAGNOSIS — F411 Generalized anxiety disorder: Secondary | ICD-10-CM

## 2021-11-05 MED ORDER — ESCITALOPRAM OXALATE 20 MG PO TABS: 20.0000 mg | ORAL_TABLET | Freq: Every day | ORAL | 1 refills | Status: DC

## 2021-11-05 MED ORDER — RISPERIDONE 1 MG PO TABS: 1.0000 mg | ORAL_TABLET | Freq: Two times a day (BID) | ORAL | 0 refills | Status: DC

## 2021-11-05 NOTE — Patient Instructions (Addendum)
Continue Lexapro 20 mg daily  Continue Risperdal 1 mg twice a day Continue lorazepam 0.5 mg twice a day as needed for anxiety  Next appointment: 1/2 at 11 AM

## 2021-11-09 ENCOUNTER — Encounter (INDEPENDENT_AMBULATORY_CARE_PROVIDER_SITE_OTHER): Payer: Self-pay

## 2021-11-17 ENCOUNTER — Telehealth: Payer: Self-pay

## 2021-11-17 ENCOUNTER — Other Ambulatory Visit: Payer: Self-pay | Admitting: Psychiatry

## 2021-11-17 MED ORDER — TRAZODONE HCL 50 MG PO TABS: 50.0000 mg | ORAL_TABLET | Freq: Every day | ORAL | 1 refills | Status: DC

## 2021-11-17 NOTE — Telephone Encounter (Signed)
patient notified

## 2021-11-17 NOTE — Telephone Encounter (Signed)
  Pt left message that she needs a refill on the trazodone. Pt last seen on 10-26 next appt 01-12-22    Disp Refills Start End   traZODone (DESYREL) 50 MG tablet 30 tablet 3 07/09/2021    Sig - Route: Take 1 tablet (50 mg total) by mouth at bedtime. - Oral   Sent to pharmacy as: traZODone (DESYREL) 50 MG tablet   E-Prescribing Status: Receipt confirmed by pharmacy (07/09/2021 10:33 AM EDT)    [place], [place]

## 2021-11-30 ENCOUNTER — Other Ambulatory Visit: Payer: Self-pay | Admitting: Psychiatry

## 2021-12-23 ENCOUNTER — Other Ambulatory Visit: Payer: Self-pay | Admitting: Psychiatry

## 2021-12-31 NOTE — Progress Notes (Deleted)
BH MD/PA/NP OP Progress Note  12/31/2021 4:15 PM Anna Adkins  MRN:  [id number]  Chief Complaint: No chief complaint on file.  HPI: *** Visit Diagnosis: No diagnosis found.  Past Psychiatric History: Please see initial evaluation for full details. I have reviewed the history. No updates at this time.     Past Medical History:  Past Medical History:  Diagnosis Date   Anxiety    Anxiety    Arthritis    Blood clot in vein    Depression    Depression    DVT (deep venous thrombosis) (HCC)    DVT of leg (deep venous thrombosis) (HCC) 2014   Pulmonary emboli (HCC)    Stroke (HCC)    TIA (transient ischemic attack) 2014    Past Surgical History:  Procedure Laterality Date   Blood clot removal     ECTOPIC PREGNANCY SURGERY     HERNIA REPAIR     HERNIA REPAIR  2001   SKIN GRAFT     TUBAL LIGATION      Family Psychiatric History: Please see initial evaluation for full details. I have reviewed the history. No updates at this time.     Family History:  Family History  Problem Relation Age of Onset   Healthy Mother    Heart disease Father    Diabetes Father    Asthma Father    Breast cancer Neg Hx     Social History:  Social History   Socioeconomic History   Marital status: Single    Spouse name: Not on file   Number of children: 4   Years of education: Not on file   Highest education level: High school graduate  Occupational History   Not on file  Tobacco Use   Smoking status: Every Day    Packs/day: 1.00    Years: 20.00    Total pack years: 20.00    Types: Cigarettes   Smokeless tobacco: Never  Vaping Use   Vaping Use: Never used  Substance and Sexual Activity   Alcohol use: Yes    Alcohol/week: 4.0 standard drinks of alcohol    Types: 4 Standard drinks or equivalent per week    Comment: occasional   Drug use: No   Sexual activity: Not Currently  Other Topics Concern   Not on file  Social History Narrative   Not on file   Social Determinants of  Health   Financial Resource Strain: Not on file  Food Insecurity: Not on file  Transportation Needs: Not on file  Physical Activity: Not on file  Stress: Not on file  Social Connections: Not on file    Allergies:  Allergies  Allergen Reactions   Sulfa Antibiotics Anaphylaxis    Metabolic Disorder Labs: Lab Results  Component Value Date   HGBA1C 5.8 (H) 05/18/2020   MPG 119.76 05/18/2020   No results found for: "PROLACTIN" Lab Results  Component Value Date   CHOL 173 05/18/2020   TRIG 224 (H) 05/18/2020   HDL 37 (L) 05/18/2020   CHOLHDL 4.7 05/18/2020   VLDL 45 (H) 05/18/2020   LDLCALC 91 05/18/2020   Lab Results  Component Value Date   TSH 6.221 (H) 06/28/2021   TSH 3.038 05/17/2020    Therapeutic Level Labs: No results found for: "LITHIUM" No results found for: "VALPROATE" No results found for: "CBMZ"  Current Medications: Current Outpatient Medications  Medication Sig Dispense Refill   ELIQUIS 5 MG TABS tablet Take 5 mg by mouth 2 (two)  times daily.     [START ON 01/08/2022] escitalopram (LEXAPRO) 20 MG tablet Take 1 tablet (20 mg total) by mouth at bedtime. 90 tablet 1   fluticasone (FLONASE) 50 MCG/ACT nasal spray Place into both nostrils.     loratadine (CLARITIN) 10 MG tablet Take 10 mg by mouth daily.     LORazepam (ATIVAN) 0.5 MG tablet Take 1 tablet (0.5 mg total) by mouth every 4 (four) hours as needed for anxiety. 30 tablet 0   Multiple Vitamin (MULTIVITAMIN WITH MINERALS) TABS tablet Take 1 tablet by mouth daily.     [START ON 01/08/2022] risperiDONE (RISPERDAL) 1 MG tablet Take 1 tablet (1 mg total) by mouth 2 (two) times daily. 180 tablet 0   SYMBICORT 80-4.5 MCG/ACT inhaler Inhale into the lungs.     traZODone (DESYREL) 50 MG tablet Take 1 tablet (50 mg total) by mouth at bedtime. 30 tablet 1   triamcinolone (KENALOG) 0.025 % cream Apply topically.     VENTOLIN HFA 108 (90 Base) MCG/ACT inhaler Inhale 1-2 puffs into the lungs every 4 (four) hours  as needed.     No current facility-administered medications for this visit.     Musculoskeletal: Strength & Muscle Tone: within normal limits Gait & Station: normal Patient leans: N/A  Psychiatric Specialty Exam: Review of Systems  There were no vitals taken for this visit.There is no height or weight on file to calculate BMI.  General Appearance: {Appearance:22683}  Eye Contact:  {BHH EYE CONTACT:22684}  Speech:  Clear and Coherent  Volume:  Normal  Mood:  {BHH MOOD:22306}  Affect:  {Affect (PAA):22687}  Thought Process:  Coherent  Orientation:  Full (Time, Place, and Person)  Thought Content: Logical   Suicidal Thoughts:  {ST/HT (PAA):22692}  Homicidal Thoughts:  {ST/HT (PAA):22692}  Memory:  Immediate;   Good  Judgement:  {Judgement (PAA):22694}  Insight:  {Insight (PAA):22695}  Psychomotor Activity:  Normal  Concentration:  Concentration: Good and Attention Span: Good  Recall:  Good  Fund of Knowledge: Good  Language: Good  Akathisia:  No  Handed:  Right  AIMS (if indicated): not done  Assets:  Communication Skills Desire for Improvement  ADL's:  Intact  Cognition: WNL  Sleep:  {BHH GOOD/FAIR/POOR:22877}   Screenings: AUDIT    Flowsheet Row Admission (Discharged) from 07/02/2021 in [facility]) [facility]  Alcohol Use Disorder Identification Test Final Score (AUDIT) 1 0      GAD-7    Flowsheet Row [facility] Visit from 11/05/20[facility] in [facility] Office Visit from 09/10/2021 in [facility] Office Visit from 08/10/2021 in [facility]  Total GAD-7 Score 2 7 7       PHQ2-9    Flowsheet Row Office Visit from 11/05/2021 in [facility] Office Visit from 09/10/2021 in [facility] Office Visit from 08/10/2021 in [facility]  PHQ-2 Total Score 1 2 1   PHQ-9 Total Score 5 8 --      Flowsheet Row Office Visit from 11/05/2021 in [facility] Office Visit from 09/10/2021 in [facility] Office Visit from 08/10/2021 in [facility]  C-SSRS RISK CATEGORY No Risk No Risk No Risk        Assessment and Plan:  Anna Adkins is a 56 y.o. year old female with a history of depression, pulmonary emboli, DVT, TIA , who presents for follow  up appointment for below.   1. MDD (major depressive disorder), recurrent episode, mild (HCC) 2. Anxiety state There has been a steady improvement in depressive symptoms and anxiety since discharge from [facility]. Psychosocial stressors includes concerned about her mother, who will move to assisted living as her sister does not let her stay anymore, and empty nest related stress.  According to the chart review, she had psychotic symptoms of delusion, tangential thought process, which she does not demonstrate since the initial visit.  Will continue current dose of Lexapro to target depression and then anxiety.  Will can do Risperdal to target depression and psychotic symptoms at this time.  Will continue lorazepam as needed for anxiety.      Plan  Continue Lexapro 20 mg daily  Continue Risperdal 1 mg twice a day- monitor weight gain  Continue lorazepam 0.5 mg twice a day as needed for anxiety (she declined a refill) Next appointment: 1/2 at 11 AM for 30 mins, in person   The patient demonstrates the following risk factors for suicide: Chronic risk factors for suicide include: psychiatric disorder of depression . Acute risk factors for suicide include: family or marital conflict, unemployment, social withdrawal/isolation, and recent discharge from inpatient psychiatry. Protective factors for this patient include: positive social support, coping skills, and hope for the future. Considering these  factors, the overall suicide risk at this point appears to be low. Patient is appropriate for outpatient follow up.         Collaboration of Care: Collaboration of Care: {BH OP Collaboration of Care:21014065}  Patient/Guardian was advised Release of Information must be obtained prior to any record release in order to collaborate their care with an outside provider. Patient/Guardian was advised if they have not already done so to contact the registration department to sign all necessary forms in order for [place] to release information regarding their care.   Consent: Patient/Guardian gives verbal consent for treatment and assignment of benefits for services provided during this visit. Patient/Guardian expressed understanding and agreed to proceed.    Neysa Hotter, MD 12/31/2021, 4:15 PM

## 2022-01-12 ENCOUNTER — Ambulatory Visit: Payer: Medicare Other | Admitting: Psychiatry

## 2022-01-26 NOTE — Progress Notes (Signed)
BH MD/PA/NP OP Progress Note  01/28/2022 3:21 PM Anna Adkins  MRN:  [id number]  Chief Complaint:  Chief Complaint  Patient presents with   Follow-up   HPI:  This is a follow-up appointment for depression and anxiety.  She states that her cousin died.  She was mentally challenged, and died at age 57.  She also reports loss of her friend at church, who she has grew up with. She has been trying to deal with it.  Her mother will move into apartment with her sister.  It is good that she does not need to be worried about her.  She is also hoping to visit her during the day.  She spends time watching movies.  She has occasional middle insomnia.  She has slight decrease in appetite.  She denies SI. The patient has mood symptoms as in PHQ-9/GAD-7. She denies SI. She denies hallucinations or paranoia.  She denies alcohol use or drug use. She ran out risperidone about a week ago, and she wonders it might be related to worsening in her mood. She agrees to contact the office before running out of her medication.   Household:  daughter Marital status: single, never married Number of children:4 Employment: on disability for DVT in 2014. She used to work at [employer] in 2013 (the job was "unbearable" to her) Education:  high school  Wt Readings from Last 3 Encounters:  01/28/22 243 lb (110.2 kg)  11/05/21 267 lb 12.8 oz (121.5 kg)  09/10/21 265 lb 3.2 oz (120.3 kg)    Visit Diagnosis:    ICD-10-CM   1. MDD (major depressive disorder), recurrent episode, mild (HCC)  F33.0     2. Anxiety state  F41.1     3. Insomnia, unspecified type  G47.00       Past Psychiatric History: Please see initial evaluation for full details. I have reviewed the history. No updates at this time.     Past Medical History:  Past Medical History:  Diagnosis Date   Anxiety    Anxiety    Arthritis    Blood clot in vein    Depression    Depression    DVT (deep venous thrombosis) (HCC)    DVT of leg (deep venous  thrombosis) (HCC) 2014   Pulmonary emboli (HCC)    Stroke (HCC)    TIA (transient ischemic attack) 2014    Past Surgical History:  Procedure Laterality Date   Blood clot removal     ECTOPIC PREGNANCY SURGERY     HERNIA REPAIR     HERNIA REPAIR  2001   SKIN GRAFT     TUBAL LIGATION      Family Psychiatric History: Please see initial evaluation for full details. I have reviewed the history. No updates at this time.     Family History:  Family History  Problem Relation Age of Onset   Healthy Mother    Heart disease Father    Diabetes Father    Asthma Father    Breast cancer Neg Hx     Social History:  Social History   Socioeconomic History   Marital status: Single    Spouse name: Not on file   Number of children: 4   Years of education: Not on file   Highest education level: High school graduate  Occupational History   Not on file  Tobacco Use   Smoking status: Every Day    Packs/day: 1.00    Years: 20.00  Total pack years: 20.00    Types: Cigarettes   Smokeless tobacco: Never  Vaping Use   Vaping Use: Never used  Substance and Sexual Activity   Alcohol use: Yes    Alcohol/week: 4.0 standard drinks of alcohol    Types: 4 Standard drinks or equivalent per week    Comment: occasional   Drug use: No   Sexual activity: Not Currently  Other Topics Concern   Not on file  Social History Narrative   Not on file   Social Determinants of Health   Financial Resource Strain: Not on file  Food Insecurity: Not on file  Transportation Needs: Not on file  Physical Activity: Not on file  Stress: Not on file  Social Connections: Not on file    Allergies:  Allergies  Allergen Reactions   Sulfa Antibiotics Anaphylaxis    Metabolic Disorder Labs: Lab Results  Component Value Date   HGBA1C 5.8 (H) 05/18/2020   MPG 119.76 05/18/2020   No results found for: "PROLACTIN" Lab Results  Component Value Date   CHOL 173 05/18/2020   TRIG 224 (H) 05/18/2020   HDL  37 (L) 05/18/2020   CHOLHDL 4.7 05/18/2020   VLDL 45 (H) 05/18/2020   LDLCALC 91 05/18/2020   Lab Results  Component Value Date   TSH 6.221 (H) 06/28/2021   TSH 3.038 05/17/2020    Therapeutic Level Labs: No results found for: "LITHIUM" No results found for: "VALPROATE" No results found for: "CBMZ"  Current Medications: Current Outpatient Medications  Medication Sig Dispense Refill   ELIQUIS 5 MG TABS tablet Take 5 mg by mouth 2 (two) times daily.     escitalopram (LEXAPRO) 20 MG tablet Take 1 tablet (20 mg total) by mouth at bedtime. 90 tablet 1   fluticasone (FLONASE) 50 MCG/ACT nasal spray Place into both nostrils.     loratadine (CLARITIN) 10 MG tablet Take 10 mg by mouth daily.     LORazepam (ATIVAN) 0.5 MG tablet Take 1 tablet (0.5 mg total) by mouth every 4 (four) hours as needed for anxiety. 30 tablet 0   Multiple Vitamin (MULTIVITAMIN WITH MINERALS) TABS tablet Take 1 tablet by mouth daily.     risperiDONE (RISPERDAL) 1 MG tablet Take 1 tablet (1 mg total) by mouth 2 (two) times daily. 180 tablet 0   SYMBICORT 80-4.5 MCG/ACT inhaler Inhale into the lungs.     triamcinolone (KENALOG) 0.025 % cream Apply topically.     VENTOLIN HFA 108 (90 Base) MCG/ACT inhaler Inhale 1-2 puffs into the lungs every 4 (four) hours as needed.     traZODone (DESYREL) 50 MG tablet Take 1 tablet (50 mg total) by mouth at bedtime. 90 tablet 0   No current facility-administered medications for this visit.     Musculoskeletal: Strength & Muscle Tone:  Normal Gait & Station: normal Patient leans: N/A  Psychiatric Specialty Exam: Review of Systems  Psychiatric/Behavioral:  Positive for dysphoric mood and sleep disturbance. Negative for agitation, behavioral problems, confusion, decreased concentration, hallucinations, self-injury and suicidal ideas. The patient is nervous/anxious. The patient is not hyperactive.   All other systems reviewed and are negative.   Blood pressure 109/74, pulse  97, height 5\' 8"  (1.727 m), weight 243 lb (110.2 kg).Body mass index is 36.95 kg/m.  General Appearance: Fairly Groomed  Eye Contact:  Good  Speech:  Clear and Coherent  Volume:  Normal  Mood:   not good  Affect:  Appropriate, Congruent, and slightly down  Thought Process:  Coherent  Orientation:  Full (Time, Place, and Person)  Thought Content: Logical   Suicidal Thoughts:  No  Homicidal Thoughts:  No  Memory:  Immediate;   Good  Judgement:  Good  Insight:  Good  Psychomotor Activity:  Normal, Normal tone, no rigidity, no resting/postural tremors, no tardive dyskinesia    Concentration:  Concentration: Good and Attention Span: Good  Recall:  Good  Fund of Knowledge: Good  Language: Good  Akathisia:  No  Handed:  Right  AIMS (if indicated): not done  Assets:  Communication Skills Desire for Improvement  ADL's:  Intact  Cognition: WNL  Sleep:  Good   Screenings: AUDIT    Flowsheet Row Admission (Discharged) from 07/02/2021 in [facility]) [facility]  Alcohol Use Disorder Identification Test Final Score (AUDIT) 1 0      GAD-7    Flowsheet Row Office Visit from 01/28/2022 in [facility] Office Visit from 11/05/2021 in [facility] Office Visit from 09/10/2021 in [facility] Office Visit from 08/10/2021 in [facility]  Total GAD-7 Score 4 2 7 7       PHQ2-9    Flowsheet Row Office Visit from 01/28/2022 in [facility] Office Visit from 11/05/2021 in [facility] Office Visit from 09/10/2021 in [facility] Office Visit from 08/10/2021 in [facility]  PHQ-2 Total Score 1 1 2 1   PHQ-9 Total Score 3 5 8  --      Flowsheet Row Office Visit from 11/05/2021 in  [facility] Office Visit from 09/10/2021 in [facility] Office Visit from 08/10/2021 in [facility]  C-SSRS RISK CATEGORY No Risk No Risk No Risk        Assessment and Plan:  Anna Adkins is a 57 y.o. year old female with a history of depression, pulmonary emboli, DVT, TIA , who presents for follow up appointment for below.   1. MDD (major depressive disorder), recurrent episode, mild (HCC) 2. Anxiety state Acute stressors include: loss of her cousin at age 32 ("mentally challenged") friend at church, who she grows up with Chronic stressors include: mother in retirement home (will be moving into apartment with her sister), empty nest   Slight worsening in the context of acute stress as above, and running out Risperdal for the past week.  She reports great relationship with her children, and her mother will be moving into her apartment with her sister.  Will continue Lexapro, Risperdal to target depression and anxiety.  Noted that she had an episode of delusion/tangential thought process in May 2022 according to the chart review- will consider slow taper down if indicated. Will continue clonazepam as needed for anxiety.   # Insomnia She complains of middle insomnia at times.  Will continue current dose to target insomnia.    Plan  Continue Lexapro 20 mg daily  Continue Risperdal 1 mg twice a day- monitor weight gain (EKG HR 96, QTC 439 msec, 06/2021) (Per pharmacist, she filled med, last on 8/31, 60 tabs. The following order was sent to [facility]) Continue lorazepam 0.5 mg twice a day as needed for anxiety (she declined a refill) Continue trazodone 50 mg at night as needed for insomnia Next appointment: 3/19 at 3 PM for 30 mins, in person   The patient demonstrates the following risk factors for suicide: Chronic risk factors for  suicide include: psychiatric disorder of depression . Acute risk factors for  suicide include: family or marital conflict, unemployment, social withdrawal/isolation, and recent discharge from inpatient psychiatry. Protective factors for this patient include: positive social support, coping skills, and hope for the future. Considering these factors, the overall suicide risk at this point appears to be low. Patient is appropriate for outpatient follow up.      Collaboration of Care: Collaboration of Care: Other reviewed notes in Epic  Patient/Guardian was advised Release of Information must be obtained prior to any record release in order to collaborate their care with an outside provider. Patient/Guardian was advised if they have not already done so to contact the registration department to sign all necessary forms in order for [place] to release information regarding their care.   Consent: Patient/Guardian gives verbal consent for treatment and assignment of benefits for services provided during this visit. Patient/Guardian expressed understanding and agreed to proceed.    Norman Clay, MD 01/28/2022, 3:21 PM

## 2022-01-28 ENCOUNTER — Ambulatory Visit (INDEPENDENT_AMBULATORY_CARE_PROVIDER_SITE_OTHER): Payer: Medicare Other | Admitting: Psychiatry

## 2022-01-28 ENCOUNTER — Encounter: Payer: Self-pay | Admitting: Psychiatry

## 2022-01-28 VITALS — BP 109/74 | HR 97 | Ht 68.0 in | Wt 243.0 lb

## 2022-01-28 DIAGNOSIS — G4700 Insomnia, unspecified: Secondary | ICD-10-CM | POA: Diagnosis not present

## 2022-01-28 DIAGNOSIS — F330 Major depressive disorder, recurrent, mild: Secondary | ICD-10-CM

## 2022-01-28 DIAGNOSIS — F411 Generalized anxiety disorder: Secondary | ICD-10-CM

## 2022-01-28 MED ORDER — TRAZODONE HCL 50 MG PO TABS
50.0000 mg | ORAL_TABLET | Freq: Every day | ORAL | 0 refills | Status: DC
Start: 2022-01-28 — End: 2022-04-27

## 2022-01-28 NOTE — Patient Instructions (Signed)
Continue Lexapro 20 mg daily  Continue Risperdal 1 mg twice a day Continue lorazepam 0.5 mg twice a day as needed for anxiety  Continue trazodone 50 mg at night as needed for insomnia Next appointment: 3/19 at 3 PM

## 2022-04-23 NOTE — Progress Notes (Unsigned)
BH MD/PA/NP OP Progress Note  04/27/2022 11:32 AM Anna Adkins  MRN:  [id number]  Chief Complaint:  Chief Complaint  Patient presents with   Follow-up   HPI:  This is a follow-up appointment for depression, anxiety and insomnia.  She states that she has been doing well.  Her daughter has moved out, and is currently employed out of state.  Although she misses her, she feels proud of her daughter.  She visits her mother.  She thinks she is doing good by having something to look forward to.  She enjoys talking with her daughter every day, and other children every week.  She does not feel alone.  She would like to get involved in activities, and has agreed to try going to church with her mother.  She sleeps well.  She denies feeling depressed.  Although she feels anxious at times randomly, it has been self-limited.  She denies panic attacks and has not taken lorazepam.  She denies AH, VH, paranoia. She denies alcohol use or drug use.  She admits that she occasionally missed to take Risperdal, although it is less than once a week.  She feels comfortable to stay on the medication as it is.   Household:  by herself Marital status: single, never married Number of children:4 Employment: on disability for DVT in 2014. She used to work at [employer] in 2013 (the job was "unbearable" to her) Education:  high school  Wt Readings from Last 3 Encounters:  04/27/22 233 lb 6.4 oz (105.9 kg)  01/28/22 243 lb (110.2 kg)  11/05/21 267 lb 12.8 oz (121.5 kg)   Visit Diagnosis:    ICD-10-CM   1. MDD (major depressive disorder), recurrent, in partial remission  F33.41     2. Anxiety state  F41.1     3. Insomnia, unspecified type  G47.00       Past Psychiatric History: Please see initial evaluation for full details. I have reviewed the history. No updates at this time.     Past Medical History:  Past Medical History:  Diagnosis Date   Anxiety    Anxiety    Arthritis    Blood clot in vein     Depression    Depression    DVT (deep venous thrombosis)    DVT of leg (deep venous thrombosis) 2014   Pulmonary emboli    Stroke    TIA (transient ischemic attack) 2014    Past Surgical History:  Procedure Laterality Date   Blood clot removal     ECTOPIC PREGNANCY SURGERY     HERNIA REPAIR     HERNIA REPAIR  2001   SKIN GRAFT     TUBAL LIGATION      Family Psychiatric History: Please see initial evaluation for full details. I have reviewed the history. No updates at this time.     Family History:  Family History  Problem Relation Age of Onset   Healthy Mother    Heart disease Father    Diabetes Father    Asthma Father    Breast cancer Neg Hx     Social History:  Social History   Socioeconomic History   Marital status: Single    Spouse name: Not on file   Number of children: 4   Years of education: Not on file   Highest education level: High school graduate  Occupational History   Not on file  Tobacco Use   Smoking status: Every Day    Packs/day:  1.00    Years: 20.00    Additional pack years: 0.00    Total pack years: 20.00    Types: Cigarettes   Smokeless tobacco: Never  Vaping Use   Vaping Use: Never used  Substance and Sexual Activity   Alcohol use: Yes    Alcohol/week: 4.0 standard drinks of alcohol    Types: 4 Standard drinks or equivalent per week    Comment: occasional   Drug use: No   Sexual activity: Not Currently  Other Topics Concern   Not on file  Social History Narrative   Not on file   Social Determinants of Health   Financial Resource Strain: Not on file  Food Insecurity: Not on file  Transportation Needs: Not on file  Physical Activity: Not on file  Stress: Not on file  Social Connections: Not on file    Allergies:  Allergies  Allergen Reactions   Sulfa Antibiotics Anaphylaxis    Metabolic Disorder Labs: Lab Results  Component Value Date   HGBA1C 5.8 (H) 05/18/2020   MPG 119.76 05/18/2020   No results found for:  "PROLACTIN" Lab Results  Component Value Date   CHOL 173 05/18/2020   TRIG 224 (H) 05/18/2020   HDL 37 (L) 05/18/2020   CHOLHDL 4.7 05/18/2020   VLDL 45 (H) 05/18/2020   LDLCALC 91 05/18/2020   Lab Results  Component Value Date   TSH 6.221 (H) 06/28/2021   TSH 3.038 05/17/2020    Therapeutic Level Labs: No results found for: "LITHIUM" No results found for: "VALPROATE" No results found for: "CBMZ"  Current Medications: Current Outpatient Medications  Medication Sig Dispense Refill   ELIQUIS 5 MG TABS tablet Take 5 mg by mouth 2 (two) times daily.     escitalopram (LEXAPRO) 20 MG tablet Take 1 tablet (20 mg total) by mouth at bedtime. 90 tablet 1   fluticasone (FLONASE) 50 MCG/ACT nasal spray Place into both nostrils.     loratadine (CLARITIN) 10 MG tablet Take 10 mg by mouth daily.     LORazepam (ATIVAN) 0.5 MG tablet Take 1 tablet (0.5 mg total) by mouth every 4 (four) hours as needed for anxiety. 30 tablet 0   Multiple Vitamin (MULTIVITAMIN WITH MINERALS) TABS tablet Take 1 tablet by mouth daily.     SYMBICORT 80-4.5 MCG/ACT inhaler Inhale into the lungs.     triamcinolone (KENALOG) 0.025 % cream Apply topically.     VENTOLIN HFA 108 (90 Base) MCG/ACT inhaler Inhale 1-2 puffs into the lungs every 4 (four) hours as needed.     risperiDONE (RISPERDAL) 1 MG tablet Take 1 tablet (1 mg total) by mouth 2 (two) times daily. 180 tablet 0   traZODone (DESYREL) 50 MG tablet Take 1 tablet (50 mg total) by mouth at bedtime. 90 tablet 1   No current facility-administered medications for this visit.     Musculoskeletal: Strength & Muscle Tone: within normal limits Gait & Station: normal Patient leans: N/A  Psychiatric Specialty Exam: Review of Systems  Psychiatric/Behavioral:  Negative for agitation, behavioral problems, confusion, decreased concentration, dysphoric mood, hallucinations, self-injury, sleep disturbance and suicidal ideas. The patient is nervous/anxious. The patient  is not hyperactive.   All other systems reviewed and are negative.   Blood pressure 127/76, pulse (!) 106, temperature (!) 97.4 F (36.3 C), temperature source Skin, height 5\' 8"  (1.727 m), weight 233 lb 6.4 oz (105.9 kg).Body mass index is 35.49 kg/m.  General Appearance: Fairly Groomed  Eye Contact:  Good  Speech:  Clear and Coherent  Volume:  Normal  Mood:   good  Affect:  Appropriate, Congruent, and calm  Thought Process:  Coherent  Orientation:  Full (Time, Place, and Person)  Thought Content: Logical   Suicidal Thoughts:  No  Homicidal Thoughts:  No  Memory:  Immediate;   Good  Judgement:  Good  Insight:  Good  Psychomotor Activity:  Normal, Normal tone, no rigidity, no resting/postural tremors, no tardive dyskinesia    Concentration:  Concentration: Good and Attention Span: Good  Recall:  Good  Fund of Knowledge: Good  Language: Good  Akathisia:  No  Handed:  Right  AIMS (if indicated): not done  Assets:  Communication Skills Desire for Improvement  ADL's:  Intact  Cognition: WNL  Sleep:  Good   Screenings: AUDIT    Flowsheet Row Admission (Discharged) from 07/02/2021 in [facility] Admission (Discharged) [facility]  Alcohol Use Disorder Identification Test Final Score (AUDIT) 1 0      GAD-7    Flowsheet Row Office Visit from 01/28/2022 in [facility] Office Visit from 11/05/2021 in [facility] Office Visit from 09/10/2021 in [facility] Office Visit from 08/10/2021 in [facility]  Total GAD-7 Score 4 2 7 7       PHQ2-9    Flowsheet Row Office Visit from 01/28/2022 in [facility] Office Visit from 11/05/2021 in [facility] Office Visit from 09/10/2021  in [facility] Office Visit from 08/10/2021 in [facility]  PHQ-2 Total Score 1 1 2 1   PHQ-9 Total Score 3 5 8  --      Flowsheet Row Office Visit from 11/05/2021 in [facility] Office Visit from 09/10/2021 in [facility] Office Visit from 08/10/2021 in [facility]  C-SSRS RISK CATEGORY No Risk No Risk No Risk        Assessment and Plan:  Anna Adkins is a 57 y.o. year old female with a history of depression, pulmonary emboli, DVT, TIA , who presents for follow up appointment for below.   1. MDD (major depressive disorder), recurrent, in partial remission 2. Anxiety state Acute stressors include: loss of her cousin at age 89 ("mentally challenged") friend at church, who she grows up with Chronic stressors include: mother in retirement home (will be moving into apartment with her sister), empty nest   History- admitted in 05/2020, found to be tangential, talking non sense, dx depression with psychosis, 06/2021 for disorganized thoughts, paranoia, hallucinations in the setting of COPD exacerbation  There has been overall improvement in depressive symptoms and anxiety since the last visit.  Will continue current medication regimen.  Will continue Lexapro to target depression and anxiety.  Will continue Risperdal for psychosis given her history, although this medication may be tapered down in the future if appropriate.  Will continue lorazepam as needed for anxiety.   # Insomnia Overall improving.  Will continue current dose of trazodone as needed for insomnia.    Plan  Continue Lexapro 20 mg daily  Continue Risperdal 1 mg twice a day- monitor weight gain (EKG HR 96, QTC 439 msec, 06/2021)  Continue lorazepam 0.5 mg twice a day as needed for anxiety (she declined a refill) Continue trazodone 50  mg at night as needed for insomnia Next appointment: 6/18 at 10:30 AM for 30 mins, in person   The patient demonstrates the following risk factors for suicide: Chronic risk factors for suicide include: psychiatric disorder of depression . Acute risk factors for suicide include: family or marital conflict, unemployment, social withdrawal/isolation, and recent discharge from inpatient psychiatry. Protective factors for this patient include: positive social support, coping skills, and hope for the future. Considering these factors, the overall suicide risk at this point appears to be low. Patient is appropriate for outpatient follow up.     Collaboration of Care: Collaboration of Care: Other reviewed notes in Epic  Patient/Guardian was advised Release of Information must be obtained prior to any record release in order to collaborate their care with an outside provider. Patient/Guardian was advised if they have not already done so to contact the registration department to sign all necessary forms in order for [place] to release information regarding their care.   Consent: Patient/Guardian gives verbal consent for treatment and assignment of benefits for services provided during this visit. Patient/Guardian expressed understanding and agreed to proceed.    Neysa Hotter, MD 04/27/2022, 11:32 AM

## 2022-04-27 ENCOUNTER — Ambulatory Visit (INDEPENDENT_AMBULATORY_CARE_PROVIDER_SITE_OTHER): Payer: Medicare Other | Admitting: Psychiatry

## 2022-04-27 ENCOUNTER — Encounter: Payer: Self-pay | Admitting: Psychiatry

## 2022-04-27 VITALS — BP 127/76 | HR 106 | Temp 97.4°F | Ht 68.0 in | Wt 233.4 lb

## 2022-04-27 DIAGNOSIS — G4700 Insomnia, unspecified: Secondary | ICD-10-CM | POA: Diagnosis not present

## 2022-04-27 DIAGNOSIS — F3341 Major depressive disorder, recurrent, in partial remission: Secondary | ICD-10-CM

## 2022-04-27 DIAGNOSIS — F411 Generalized anxiety disorder: Secondary | ICD-10-CM

## 2022-04-27 MED ORDER — TRAZODONE HCL 50 MG PO TABS: 50.0000 mg | ORAL_TABLET | Freq: Every day | ORAL | 1 refills | Status: DC

## 2022-04-27 MED ORDER — RISPERIDONE 1 MG PO TABS: 1.0000 mg | ORAL_TABLET | Freq: Two times a day (BID) | ORAL | 0 refills | Status: DC

## 2022-04-27 NOTE — Patient Instructions (Signed)
Continue Lexapro 20 mg daily  Continue Risperdal 1 mg twice a day- monitor weight gain  Continue lorazepam 0.5 mg twice a day as needed for anxiety Continue trazodone 50 mg at night as needed for insomnia Next appointment: 6/18 at 10:30 AM

## 2022-04-28 ENCOUNTER — Other Ambulatory Visit: Payer: Self-pay | Admitting: Psychiatry

## 2022-05-06 ENCOUNTER — Telehealth: Payer: Self-pay

## 2022-05-06 NOTE — Telephone Encounter (Signed)
called pharmacy they had the rx on hold so they will get the rx ready.    Disp Refills Start End   escitalopram (LEXAPRO) 20 MG tablet 90 tablet 1 01/08/2022 07/07/2022   Sig - Route: Take 1 tablet (20 mg total) by mouth at bedtime. - Oral   Sent to pharmacy as: escitalopram (LEXAPRO) 20 MG tablet   Notes to Pharmacy: Fill after 12/22   E-Prescribing Status: Receipt confirmed by pharmacy (11/05/2021 12:00 PM EDT)    Pharmacy  [facility] - [place],  - [address]

## 2022-05-06 NOTE — Telephone Encounter (Signed)
pt notified that rx was already at pharmacy that they had rx on hold but i told them to go ahead and get that ready for her.

## 2022-05-06 NOTE — Telephone Encounter (Signed)
pt left a message that she needs a refill on the escitalopram .

## 2022-06-24 NOTE — Progress Notes (Signed)
BH MD/PA/NP OP Progress Note  06/29/2022 11:00 AM Anna Adkins  MRN:  [id number]  Chief Complaint:  Chief Complaint  Patient presents with   Follow-up   HPI:  This is a follow-up appointment for depression, anxiety and insomnia.  She states that her daughter has moved back due to work injury.  She has been unemployed for the past several weeks due to injury on her finger.  She feels good, and not feeling lonely.  She visits her mother, and she has been doing well.  She has been trying to go outside more often.  She enjoys breast-feeding at home.  She sleeps up to 9 hours.  Although she used to have appetite loss, she has been working on to eat healthy food.  She denies SI.  She reports occasional self-limiting anxiety.  She has not taken any Ativan.  She denies hallucinations or paranoia.  She is willing to explore activities to prepare for when her daughter leaves the house, allowing her to have some alone time.  Household:  by herself Marital status: single, never married Number of children:4 Employment: on disability for DVT in 2014. She used to work at [employer] in 2013 (the job was "unbearable" to her) Education:  high school  Wt Readings from Last 3 Encounters:  06/29/22 218 lb (98.9 kg)  04/27/22 233 lb 6.4 oz (105.9 kg)  01/28/22 243 lb (110.2 kg)     Visit Diagnosis:    ICD-10-CM   1. MDD (major depressive disorder), recurrent, in partial remission (HCC)  F33.41     2. Anxiety state  F41.1     3. Insomnia, unspecified type  G47.00       Past Psychiatric History: Please see initial evaluation for full details. I have reviewed the history. No updates at this time.     Past Medical History:  Past Medical History:  Diagnosis Date   Anxiety    Anxiety    Arthritis    Blood clot in vein    Depression    Depression    DVT (deep venous thrombosis) (HCC)    DVT of leg (deep venous thrombosis) (HCC) 2014   Pulmonary emboli (HCC)    Stroke (HCC)    TIA (transient  ischemic attack) 2014    Past Surgical History:  Procedure Laterality Date   Blood clot removal     ECTOPIC PREGNANCY SURGERY     HERNIA REPAIR     HERNIA REPAIR  2001   SKIN GRAFT     TUBAL LIGATION      Family Psychiatric History: Please see initial evaluation for full details. I have reviewed the history. No updates at this time.     Family History:  Family History  Problem Relation Age of Onset   Healthy Mother    Heart disease Father    Diabetes Father    Asthma Father    Breast cancer Neg Hx     Social History:  Social History   Socioeconomic History   Marital status: Single    Spouse name: Not on file   Number of children: 4   Years of education: Not on file   Highest education level: High school graduate  Occupational History   Not on file  Tobacco Use   Smoking status: Every Day    Packs/day: 1.00    Years: 20.00    Additional pack years: 0.00    Total pack years: 20.00    Types: Cigarettes   Smokeless  tobacco: Never  Vaping Use   Vaping Use: Never used  Substance and Sexual Activity   Alcohol use: Yes    Alcohol/week: 4.0 standard drinks of alcohol    Types: 4 Standard drinks or equivalent per week    Comment: occasional   Drug use: No   Sexual activity: Not Currently  Other Topics Concern   Not on file  Social History Narrative   Not on file   Social Determinants of Health   Financial Resource Strain: Not on file  Food Insecurity: Not on file  Transportation Needs: Not on file  Physical Activity: Not on file  Stress: Not on file  Social Connections: Not on file    Allergies:  Allergies  Allergen Reactions   Sulfa Antibiotics Anaphylaxis    Metabolic Disorder Labs: Lab Results  Component Value Date   HGBA1C 5.8 (H) 05/18/2020   MPG 119.76 05/18/2020   No results found for: "PROLACTIN" Lab Results  Component Value Date   CHOL 173 05/18/2020   TRIG 224 (H) 05/18/2020   HDL 37 (L) 05/18/2020   CHOLHDL 4.7 05/18/2020   VLDL  45 (H) 05/18/2020   LDLCALC 91 05/18/2020   Lab Results  Component Value Date   TSH 6.221 (H) 06/28/2021   TSH 3.038 05/17/2020    Therapeutic Level Labs: No results found for: "LITHIUM" No results found for: "VALPROATE" No results found for: "CBMZ"  Current Medications: Current Outpatient Medications  Medication Sig Dispense Refill   ELIQUIS 5 MG TABS tablet Take 5 mg by mouth 2 (two) times daily.     escitalopram (LEXAPRO) 20 MG tablet Take 1 tablet (20 mg total) by mouth at bedtime. 90 tablet 1   fluticasone (FLONASE) 50 MCG/ACT nasal spray Place into both nostrils.     loratadine (CLARITIN) 10 MG tablet Take 10 mg by mouth daily.     Multiple Vitamin (MULTIVITAMIN WITH MINERALS) TABS tablet Take 1 tablet by mouth daily.     risperiDONE (RISPERDAL) 1 MG tablet Take 1 tablet (1 mg total) by mouth 2 (two) times daily. 180 tablet 0   SYMBICORT 80-4.5 MCG/ACT inhaler Inhale into the lungs.     traZODone (DESYREL) 50 MG tablet Take 1 tablet (50 mg total) by mouth at bedtime. 90 tablet 1   triamcinolone (KENALOG) 0.025 % cream Apply topically.     VENTOLIN HFA 108 (90 Base) MCG/ACT inhaler Inhale 1-2 puffs into the lungs every 4 (four) hours as needed.     No current facility-administered medications for this visit.     Musculoskeletal: Strength & Muscle Tone:  Normal Gait & Station:  Normal Patient leans: N/A  Psychiatric Specialty Exam: Review of Systems  Psychiatric/Behavioral:  Negative for agitation, behavioral problems, confusion, decreased concentration, dysphoric mood, hallucinations, self-injury, sleep disturbance and suicidal ideas. The patient is nervous/anxious. The patient is not hyperactive.   All other systems reviewed and are negative.   Blood pressure 116/76, pulse (!) 116, temperature 97.9 F (36.6 C), temperature source Skin, height 5\' 8"  (1.727 m), weight 218 lb (98.9 kg).Body mass index is 33.15 kg/m.  General Appearance: Fairly Groomed  Eye Contact:   Good  Speech:  Clear and Coherent  Volume:  Normal  Mood:   good  Affect:  Appropriate, Congruent, and Full Range  Thought Process:  Coherent  Orientation:  Full (Time, Place, and Person)  Thought Content: Logical   Suicidal Thoughts:  No  Homicidal Thoughts:  No  Memory:  Immediate;   Good  Judgement:  Good  Insight:  Good  Psychomotor Activity:  Normal  Concentration:  Concentration: Good and Attention Span: Good  Recall:  Good  Fund of Knowledge: Good  Language: Good  Akathisia:  No  Handed:  Right  AIMS (if indicated): not done  Assets:  Communication Skills Desire for Improvement  ADL's:  Intact  Cognition: WNL  Sleep:  Good   Screenings: AUDIT    Flowsheet Row Admission (Discharged) from 07/02/2021 in [facility]  Alcohol Use Disorder Identification Test Final Score (AUDIT) 1 0      GAD-7    Flowsheet Row Office Visit from 01/28/2022 in [facility] Office Visit from 11/05/2021 in [facility] Office Visit from 09/10/2021 in [facility] Office Visit from 08/10/2021 in [facility]  Total GAD-7 Score 4 2 7 7       PHQ2-9    Flowsheet Row Office Visit from 01/28/2022 in [facility] Office Visit from 11/05/2021 in [facility] Office Visit from 09/10/2021 in [facility] Office Visit from 08/10/2021 in [facility]  PHQ-2 Total Score 1 1 2 1   PHQ-9 Total Score 3 5 8  --      Flowsheet Row Office Visit from 11/05/2021 in [facility] Office Visit from 09/10/2021 in [facility] Office Visit from 08/10/2021 in [facility]  C-SSRS RISK CATEGORY No Risk No Risk No Risk        Assessment and Plan:  CLYTEE MAJUMDAR is a 57 y.o. year old female with a history of depression, pulmonary emboli, DVT, TIA , who presents for follow up appointment for below.   1. MDD (major depressive disorder), recurrent, in partial remission (HCC) 2. Anxiety state Acute stressors include: loss of her cousin at age 17 ("mentally challenged") friend at church, who she grows up with Chronic stressors include: mother in retirement home (will be moving into apartment with her sister), empty nest   History- admitted in 05/2020, found to be tangential, talking non sense, dx depression with psychosis, 06/2021 for disorganized thoughts, paranoia, hallucinations in the setting of COPD exacerbation  There has been steady improvement in depressive symptoms and anxiety especially since her daughter has moved back.  Will continue current medication regimen.  Will continue Lexapro to target depression and anxiety.  Will continue Risperdal for history of psychosis, although we may want to taper it down in the future if she has consistent improvement in her mood symptoms.  Will discontinue clonazepam at this time given she has not taken this medication at least for the past few months.   # Insomnia Improving.  Will continue trazodone as needed for insomnia.    Plan  Continue Lexapro 20 mg daily  Continue Risperdal 1 mg twice a day- monitor weight gain (EKG HR 96, QTC 439 msec, 06/2021)  Discontinue lorazepam (she has not been taking this) Continue trazodone 50 mg at night as needed for insomnia Next appointment: 8/19 at 11:30 for 30 mins IP   The patient demonstrates the following risk factors for suicide: Chronic risk factors for suicide include: psychiatric disorder of depression . Acute risk factors for suicide include: family or marital conflict,  unemployment, social withdrawal/isolation, and recent discharge from  inpatient psychiatry. Protective factors for this patient include: positive social support, coping skills, and hope for the future. Considering these factors, the overall suicide risk at this point appears to be low. Patient is appropriate for outpatient follow up.       Collaboration of Care: Collaboration of Care: Other reviewed notes in Epic  Patient/Guardian was advised Release of Information must be obtained prior to any record release in order to collaborate their care with an outside provider. Patient/Guardian was advised if they have not already done so to contact the registration department to sign all necessary forms in order for [place] to release information regarding their care.   Consent: Patient/Guardian gives verbal consent for treatment and assignment of benefits for services provided during this visit. Patient/Guardian expressed understanding and agreed to proceed.    Neysa Hotter, MD 06/29/2022, 11:00 AM

## 2022-06-29 ENCOUNTER — Ambulatory Visit (INDEPENDENT_AMBULATORY_CARE_PROVIDER_SITE_OTHER): Payer: Medicare Other | Admitting: Psychiatry

## 2022-06-29 ENCOUNTER — Encounter: Payer: Self-pay | Admitting: Psychiatry

## 2022-06-29 VITALS — BP 116/76 | HR 116 | Temp 97.9°F | Ht 68.0 in | Wt 218.0 lb

## 2022-06-29 DIAGNOSIS — F411 Generalized anxiety disorder: Secondary | ICD-10-CM | POA: Diagnosis not present

## 2022-06-29 DIAGNOSIS — G4700 Insomnia, unspecified: Secondary | ICD-10-CM | POA: Diagnosis not present

## 2022-06-29 DIAGNOSIS — F3341 Major depressive disorder, recurrent, in partial remission: Secondary | ICD-10-CM | POA: Diagnosis not present

## 2022-06-29 MED ORDER — RISPERIDONE 1 MG PO TABS: 1.0000 mg | ORAL_TABLET | Freq: Two times a day (BID) | ORAL | 0 refills | Status: DC

## 2022-06-29 MED ORDER — ESCITALOPRAM OXALATE 20 MG PO TABS: 20.0000 mg | ORAL_TABLET | Freq: Every day | ORAL | 1 refills | Status: DC

## 2022-07-13 ENCOUNTER — Other Ambulatory Visit: Payer: Self-pay | Admitting: Family Medicine

## 2022-07-13 DIAGNOSIS — Z1231 Encounter for screening mammogram for malignant neoplasm of breast: Secondary | ICD-10-CM

## 2022-08-02 IMAGING — CT CT ANGIO CHEST
2 of 6 series · 17 of 46 positions shown · IV contrast (APPLIED)
Comparison: PA and lateral chest 05/17/2020, PA and lateral
04/18/2015 and chest CT with contrast 04/21/2015.

CLINICAL DATA: Pulmonary embolism suspected.  High probability.

EXAM:
CT ANGIOGRAPHY CHEST WITH CONTRAST
TECHNIQUE: Multidetector CT imaging of the chest was performed using the
standard protocol during bolus administration of intravenous
contrast. Multiplanar CT image reconstructions and MIPs were
obtained to evaluate the vascular anatomy.

[Series 6: thins · axial · 0.81mm/px · z∈[-896,-604]mm · 14 of 458 slices shown]
[im 20/458  lung]
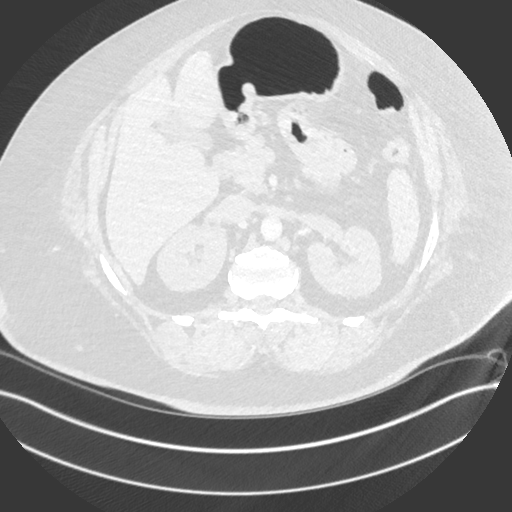
[im 60/458  soft-tissue]
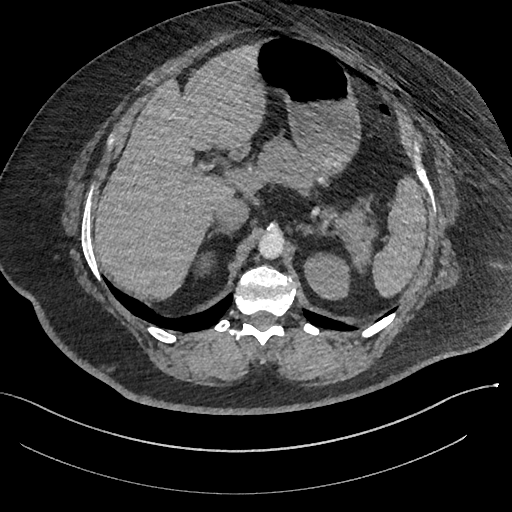
[im 80/458  lung]
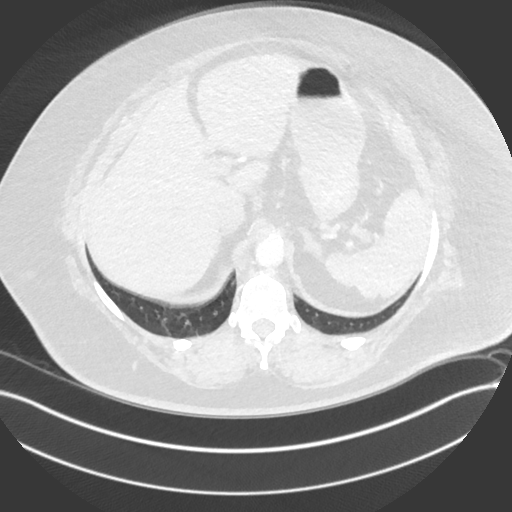
[im 120/458  soft-tissue]
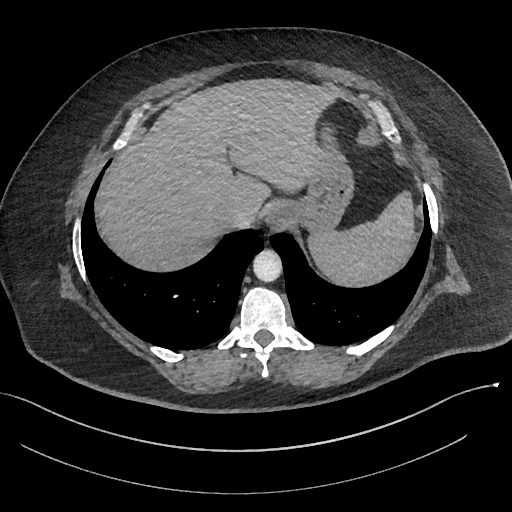
[im 159/458  lung]
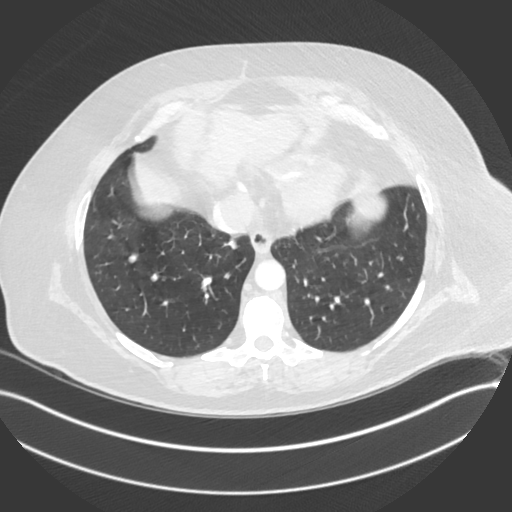
[im 179/458  soft-tissue]
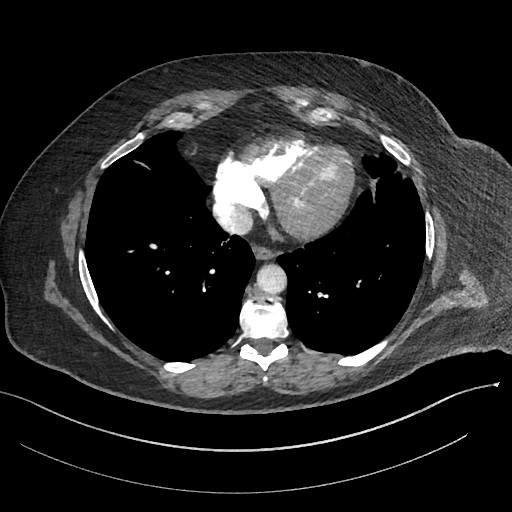
[im 219/458  lung]
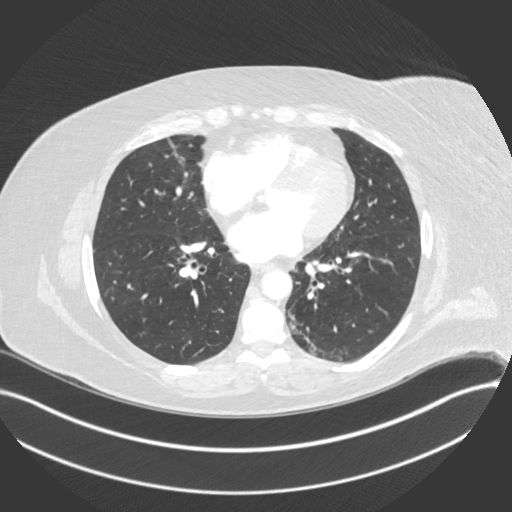
[im 239/458  soft-tissue]
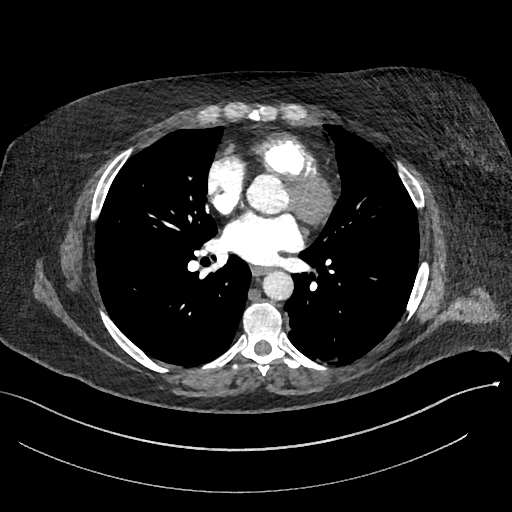
[im 279/458  lung]
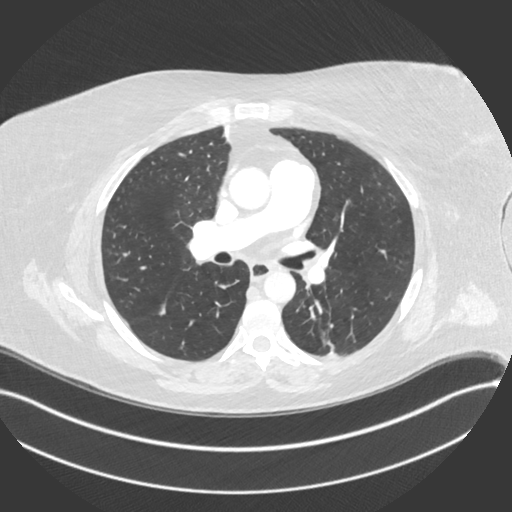
[im 299/458  soft-tissue]
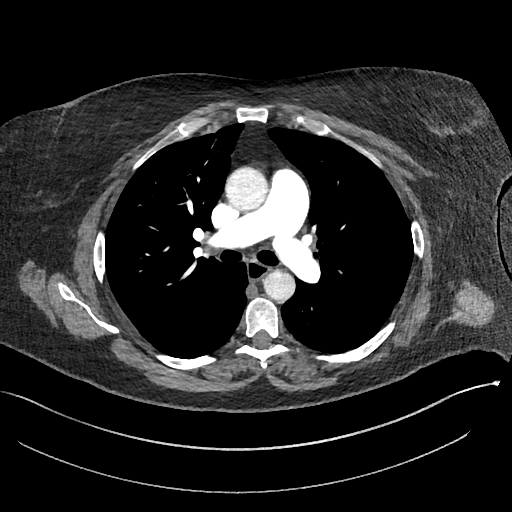
[im 338/458  lung]
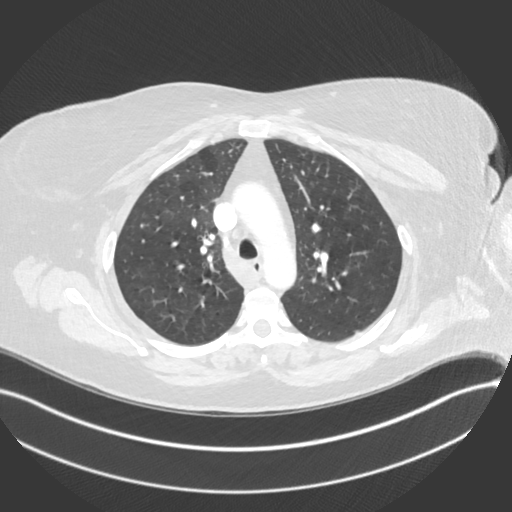
[im 378/458  soft-tissue]
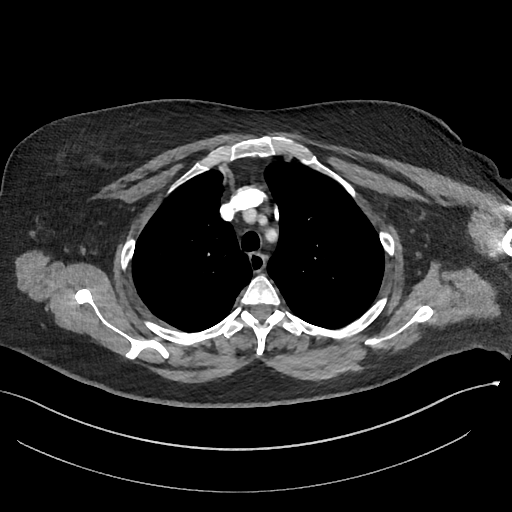
[im 398/458  lung]
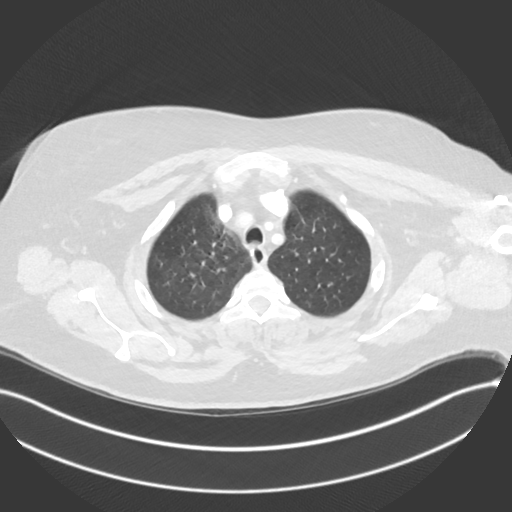
[im 438/458  soft-tissue]
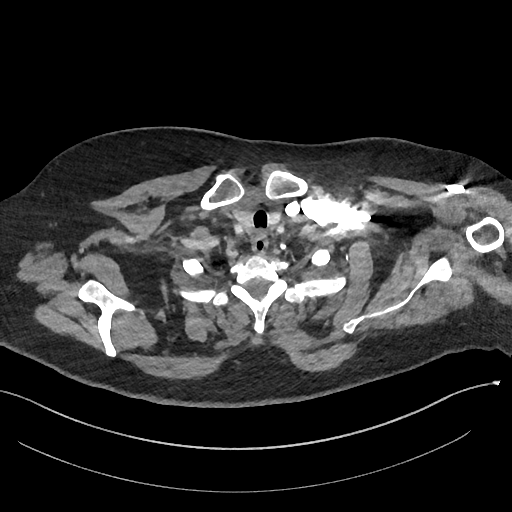

[Series 7: cor · coronal · 0.61mm/px · 3 of 176 slices shown]
[im 44/176  soft-tissue]
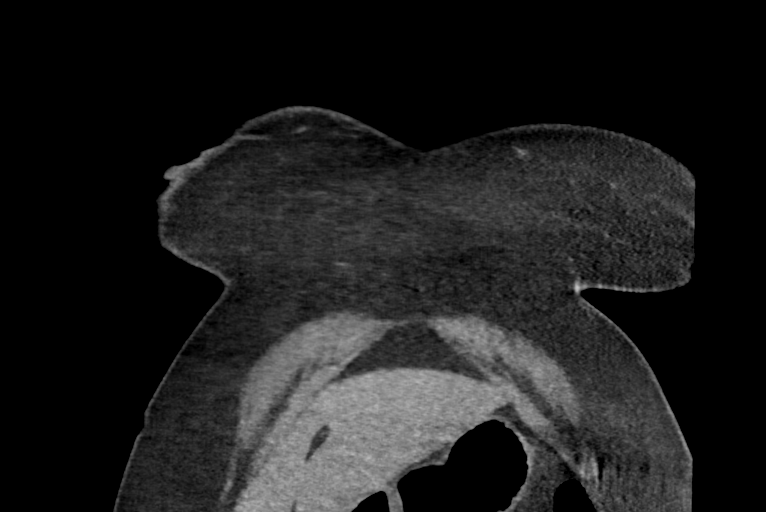
[im 88/176  soft-tissue]
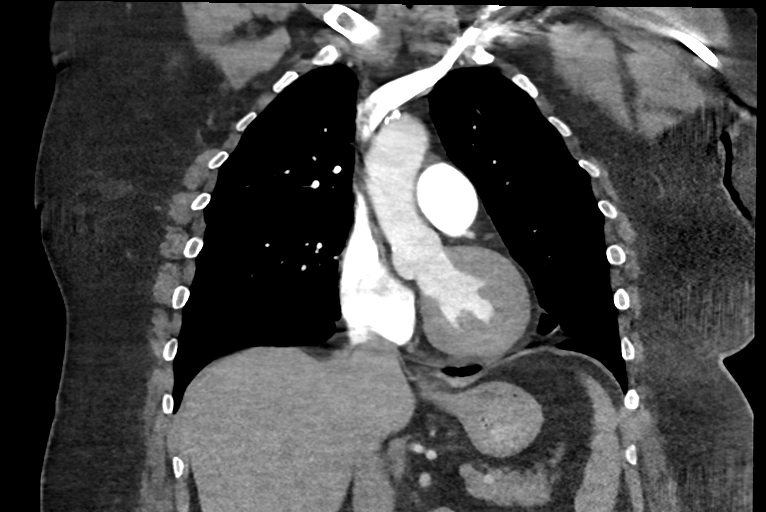
[im 132/176  soft-tissue]
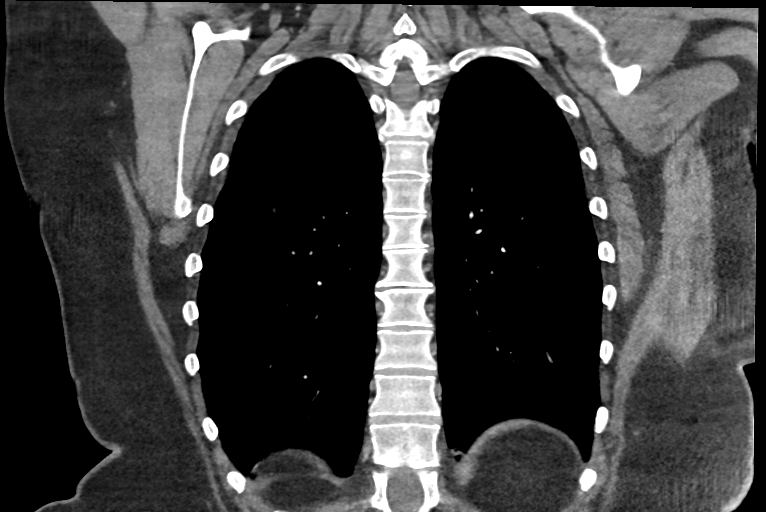

[17 of 46 positions shown; findings below may reference images not displayed]

RADIATION DOSE REDUCTION: This exam was performed according to the
departmental dose-optimization program which includes automated
exposure control, adjustment of the mA and/or kV according to
patient size and/or use of iterative reconstruction technique.

CONTRAST:  75mL OMNIPAQUE IOHEXOL 350 MG/ML SOLN
FINDINGS: Cardiovascular: The cardiac size is normal. There is no visible
coronary artery calcification. There is no evidence of acute right
heart strain with again noted mildly prominent pulmonary trunk
measuring 3.4 cm indicating arterial hypertension. No arterial
embolic filling defect is seen with satisfactory opacification to
the segmental arterial level.

The pulmonary veins are decompressed. There is atherosclerosis in
the aortic arch with scattered calcifications, with no aortic
aneurysm or dissection. There is normal great vessel branching and
enhancement.

Mediastinum/Nodes: No enlarged mediastinal, hilar, or axillary lymph
nodes. Thyroid gland, trachea, and esophagus demonstrate no
significant findings.

Lungs/Pleura: coarse linear scarring is again noted in the lingular
and right middle lobe bases and posteriorly in the superior segment
of the left lower lobe. There is increased linear atelectasis or
scarring anteriorly in the right upper lobe.

Mild infrahilar bronchial thickening continues to be noted in the
lower lobes. There are chronic small bronchiolar impactions in the
medial basal right lower lobe.

No focal pneumonia or mass is seen. The central airways are clear.
There is no pleural effusion, thickening or pneumothorax.

Upper Abdomen: No acute abnormality.  Chronic hepatic steatosis.

Musculoskeletal: No chest wall abnormality. No acute or significant
osseous findings.

Review of the MIP images confirms the above findings.
IMPRESSION: 1. Chronic slight prominence of the pulmonary trunk but no evidence
of acute thromboembolism.
2. Evidence for bronchitis in both lower lobes and chronic small
airways disease in the medial basal right lower lobe. No acute
pneumonia is seen. Scattered areas of scarring.
3. Aortic atherosclerosis.
4. Hepatic steatosis.

## 2022-08-22 NOTE — Progress Notes (Signed)
BH MD/PA/NP OP Progress Note  08/30/2022 12:10 PM Anna Adkins  MRN:  [id number]  Chief Complaint:  Chief Complaint  Patient presents with   Follow-up   HPI:  This is a follow-up appointment for depression and anxiety.  She states that she tends to stay in the bed.  She cannot make herself do things.  Although she does not know why she has been feeling this way, she is worried about her daughter.  Her daughter has been trying to work.  She has moved in due to financial issues.  She sleeps up to 11 hours, and still feels tired.  She is losing hair.  She had PCP visit the other time, and will have follow-up.  She has decrease in appetite, and eats once a day.  Despite these, she is concerned of adding other medication.  She is willing to work on behavioral activation; takes a walk 15 minutes every day.  She has initial and middle insomnia.  She denies SI.  She denies alcohol use or drug use.   Household:  daughter Marital status: single, never married Number of children:4 Employment: on disability for DVT in 2014. She used to work at [employer] in 2013 (the job was "unbearable" to her) Education:  high school  Wt Readings from Last 3 Encounters:  08/30/22 207 lb 9.6 oz (94.2 kg)  06/29/22 218 lb (98.9 kg)  04/27/22 233 lb 6.4 oz (105.9 kg)    Visit Diagnosis:    ICD-10-CM   1. MDD (major depressive disorder), recurrent episode, mild (HCC)  F33.0     2. Anxiety state  F41.1     3. Insomnia, unspecified type  G47.00       Past Psychiatric History: Please see initial evaluation for full details. I have reviewed the history. No updates at this time.     Past Medical History:  Past Medical History:  Diagnosis Date   Anxiety    Anxiety    Arthritis    Blood clot in vein    Depression    Depression    DVT (deep venous thrombosis) (HCC)    DVT of leg (deep venous thrombosis) (HCC) 2014   Pulmonary emboli (HCC)    Stroke (HCC)    TIA (transient ischemic attack) 2014    Past  Surgical History:  Procedure Laterality Date   Blood clot removal     ECTOPIC PREGNANCY SURGERY     HERNIA REPAIR     HERNIA REPAIR  2001   SKIN GRAFT     TUBAL LIGATION      Family Psychiatric History: Please see initial evaluation for full details. I have reviewed the history. No updates at this time.     Family History:  Family History  Problem Relation Age of Onset   Healthy Mother    Heart disease Father    Diabetes Father    Asthma Father    Breast cancer Neg Hx     Social History:  Social History   Socioeconomic History   Marital status: Single    Spouse name: Not on file   Number of children: 4   Years of education: Not on file   Highest education level: High school graduate  Occupational History   Not on file  Tobacco Use   Smoking status: Every Day    Current packs/day: 1.00    Average packs/day: 1 pack/day for 20.0 years (20.0 ttl pk-yrs)    Types: Cigarettes   Smokeless tobacco: Never  Vaping Use   Vaping status: Never Used  Substance and Sexual Activity   Alcohol use: Yes    Alcohol/week: 4.0 standard drinks of alcohol    Types: 4 Standard drinks or equivalent per week    Comment: occasional   Drug use: No   Sexual activity: Not Currently  Other Topics Concern   Not on file  Social History Narrative   Not on file   Social Determinants of Health   Financial Resource Strain: Not on file  Food Insecurity: Not on file  Transportation Needs: Not on file  Physical Activity: Not on file  Stress: Not on file  Social Connections: Not on file    Allergies:  Allergies  Allergen Reactions   Sulfa Antibiotics Anaphylaxis    Metabolic Disorder Labs: Lab Results  Component Value Date   HGBA1C 5.8 (H) 05/18/2020   MPG 119.76 05/18/2020   No results found for: "PROLACTIN" Lab Results  Component Value Date   CHOL 173 05/18/2020   TRIG 224 (H) 05/18/2020   HDL 37 (L) 05/18/2020   CHOLHDL 4.7 05/18/2020   VLDL 45 (H) 05/18/2020   LDLCALC 91  05/18/2020   Lab Results  Component Value Date   TSH 6.221 (H) 06/28/2021   TSH 3.038 05/17/2020    Therapeutic Level Labs: No results found for: "LITHIUM" No results found for: "VALPROATE" No results found for: "CBMZ"  Current Medications: Current Outpatient Medications  Medication Sig Dispense Refill   ELIQUIS 5 MG TABS tablet Take 5 mg by mouth 2 (two) times daily.     escitalopram (LEXAPRO) 20 MG tablet Take 1 tablet (20 mg total) by mouth at bedtime. 90 tablet 1   fluticasone (FLONASE) 50 MCG/ACT nasal spray Place into both nostrils.     loratadine (CLARITIN) 10 MG tablet Take 10 mg by mouth daily.     Multiple Vitamin (MULTIVITAMIN WITH MINERALS) TABS tablet Take 1 tablet by mouth daily.     risperiDONE (RISPERDAL) 1 MG tablet Take 1 tablet (1 mg total) by mouth 2 (two) times daily. 180 tablet 0   SYMBICORT 80-4.5 MCG/ACT inhaler Inhale into the lungs.     traZODone (DESYREL) 50 MG tablet Take 1 tablet (50 mg total) by mouth at bedtime. 90 tablet 1   triamcinolone (KENALOG) 0.025 % cream Apply topically.     VENTOLIN HFA 108 (90 Base) MCG/ACT inhaler Inhale 1-2 puffs into the lungs every 4 (four) hours as needed.     No current facility-administered medications for this visit.     Musculoskeletal: Strength & Muscle Tone: within normal limits Gait & Station: normal Patient leans: N/A  Psychiatric Specialty Exam: Review of Systems  Psychiatric/Behavioral:  Positive for decreased concentration, dysphoric mood and sleep disturbance. Negative for agitation, behavioral problems, confusion, hallucinations, self-injury and suicidal ideas. The patient is nervous/anxious. The patient is not hyperactive.   All other systems reviewed and are negative.   Blood pressure 107/72, pulse 98, temperature 97.7 F (36.5 C), temperature source Skin, height 5\' 8"  (1.727 m), weight 207 lb 9.6 oz (94.2 kg).Body mass index is 31.57 kg/m.  General Appearance: Fairly Groomed  Eye Contact:   Good  Speech:  Clear and Coherent  Volume:  Normal  Mood:   tired  Affect:  Appropriate, Congruent, and calm  Thought Process:  Coherent  Orientation:  Full (Time, Place, and Person)  Thought Content: Logical   Suicidal Thoughts:  No  Homicidal Thoughts:  No  Memory:  Immediate;   Good  Judgement:  Good  Insight:  Good  Psychomotor Activity:  Normal  Concentration:  Concentration: Good and Attention Span: Good  Recall:  Good  Fund of Knowledge: Good  Language: Good  Akathisia:  No  Handed:  Right  AIMS (if indicated): not done  Assets:  Communication Skills Desire for Improvement  ADL's:  Intact  Cognition: WNL  Sleep:  Poor   Screenings: AUDIT    Flowsheet Row Admission (Discharged) from 07/02/2021 in [facility]) [facility]  Alcohol Use Disorder Identification Test Final Score (AUDIT) 1 0      GAD-7    Flowsheet Row Office Visit from 01/28/2022 in [facility] Office Visit from 11/05/2021 in [facility] Office Visit from 09/10/2021 in [facility] Office Visit from 08/10/2021 in [facility]  Total GAD-7 Score 4 2 7 7       PHQ2-9    Flowsheet Row Office Visit from 01/28/2022 in [facility] Office Visit from 11/05/2021 in [facility] Office Visit from 09/10/2021 in [facility] Office Visit from 08/10/2021 in [facility]  PHQ-2 Total Score 1 1 2 1   PHQ-9 Total Score 3 5 8  --      Flowsheet Row Office Visit from 11/05/2021 in [facility] Office Visit from 09/10/2021 in [facility]  Office Visit from 08/10/2021 in [facility]  C-SSRS RISK CATEGORY No Risk No Risk No Risk        Assessment and Plan:  ELEESA VANAKEN is a 57 y.o. year old female with a history of depression, pulmonary emboli, DVT, TIA , who presents for follow up appointment for below.   1. MDD (major depressive disorder), recurrent episode, mild (HCC) 2. Anxiety state Acute stressors include: loss of her cousin at age 54 ("mentally challenged") friend at church, who she grows up with Chronic stressors include: mother in retirement home (will be moving into apartment with her sister), empty nest   History- admitted in 05/2020, found to be tangential, talking non sense, dx depression with psychosis, 06/2021 for disorganized thoughts, paranoia, hallucinations in the setting of COPD exacerbation   There has been worsening in low energy over the past several weeks since her daughter has moved back due to finger injury, although she enjoys her company.  Although it was discussed to consider adding bupropion to optimize treatment, she not interested in this intervention at this time.  She is willing to work on behavioral activation.  Will continue Lexapro to target depression and anxiety, along with Risperdal due to history of psychosis.    # Insomnia Slightly worsening.  She is unsure of snoring.  Although sleep evaluation was recommended, she would like to hold this at this time.  Will continue trazodone as needed for insomnia.    Plan  Continue Lexapro 20 mg daily  Continue Risperdal 1 mg twice a day- monitor weight gain (EKG HR 96, QTC 439 msec, 06/2021)  Continue trazodone 50 mg at night as needed for insomnia Next appointment: 10/3 at 3:30, IP - will plan to check TSH if that is not done by PCP   The patient demonstrates the following risk factors for suicide: Chronic risk factors for suicide include: psychiatric disorder of depression .  Acute risk factors for suicide  include: family or marital conflict, unemployment, social withdrawal/isolation, and recent discharge from inpatient psychiatry. Protective factors for this patient include: positive social support, coping skills, and hope for the future. Considering these factors, the overall suicide risk at this point appears to be low. Patient is appropriate for outpatient follow up.       Collaboration of Care: Collaboration of Care: Other reviewed notes in Epic  Patient/Guardian was advised Release of Information must be obtained prior to any record release in order to collaborate their care with an outside provider. Patient/Guardian was advised if they have not already done so to contact the registration department to sign all necessary forms in order for [place] to release information regarding their care.   Consent: Patient/Guardian gives verbal consent for treatment and assignment of benefits for services provided during this visit. Patient/Guardian expressed understanding and agreed to proceed.    Neysa Hotter, MD 08/30/2022, 12:10 PM

## 2022-08-30 ENCOUNTER — Ambulatory Visit: Payer: Medicare Other | Admitting: Psychiatry

## 2022-08-30 ENCOUNTER — Encounter: Payer: Self-pay | Admitting: Psychiatry

## 2022-08-30 VITALS — BP 107/72 | HR 98 | Temp 97.7°F | Ht 68.0 in | Wt 207.6 lb

## 2022-08-30 DIAGNOSIS — F330 Major depressive disorder, recurrent, mild: Secondary | ICD-10-CM

## 2022-08-30 DIAGNOSIS — G4700 Insomnia, unspecified: Secondary | ICD-10-CM

## 2022-08-30 DIAGNOSIS — F411 Generalized anxiety disorder: Secondary | ICD-10-CM | POA: Diagnosis not present

## 2022-08-30 NOTE — Patient Instructions (Signed)
Continue Lexapro 20 mg daily  Continue Risperdal 1 mg twice a day Continue trazodone 50 mg at night as needed for insomnia Next appointment: 10/3 at 3:30

## 2022-09-29 ENCOUNTER — Other Ambulatory Visit: Payer: Self-pay | Admitting: Family Medicine

## 2022-09-29 DIAGNOSIS — J449 Chronic obstructive pulmonary disease, unspecified: Secondary | ICD-10-CM

## 2022-09-29 DIAGNOSIS — Z87891 Personal history of nicotine dependence: Secondary | ICD-10-CM

## 2022-10-08 NOTE — Progress Notes (Deleted)
BH MD/PA/NP OP Progress Note  10/08/2022 4:09 PM Anna Adkins  MRN:  [id number]  Chief Complaint: No chief complaint on file.  HPI: *** Visit Diagnosis: No diagnosis found.  Past Psychiatric History: Please see initial evaluation for full details. I have reviewed the history. No updates at this time.     Past Medical History:  Past Medical History:  Diagnosis Date   Anxiety    Anxiety    Arthritis    Blood clot in vein    Depression    Depression    DVT (deep venous thrombosis) (HCC)    DVT of leg (deep venous thrombosis) (HCC) 2014   Pulmonary emboli (HCC)    Stroke (HCC)    TIA (transient ischemic attack) 2014    Past Surgical History:  Procedure Laterality Date   Blood clot removal     ECTOPIC PREGNANCY SURGERY     HERNIA REPAIR     HERNIA REPAIR  2001   SKIN GRAFT     TUBAL LIGATION      Family Psychiatric History: Please see initial evaluation for full details. I have reviewed the history. No updates at this time.     Family History:  Family History  Problem Relation Age of Onset   Healthy Mother    Heart disease Father    Diabetes Father    Asthma Father    Breast cancer Neg Hx     Social History:  Social History   Socioeconomic History   Marital status: Single    Spouse name: Not on file   Number of children: 4   Years of education: Not on file   Highest education level: High school graduate  Occupational History   Not on file  Tobacco Use   Smoking status: Every Day    Current packs/day: 1.00    Average packs/day: 1 pack/day for 20.0 years (20.0 ttl pk-yrs)    Types: Cigarettes   Smokeless tobacco: Never  Vaping Use   Vaping status: Never Used  Substance and Sexual Activity   Alcohol use: Yes    Alcohol/week: 4.0 standard drinks of alcohol    Types: 4 Standard drinks or equivalent per week    Comment: occasional   Drug use: No   Sexual activity: Not Currently  Other Topics Concern   Not on file  Social History Narrative   Not on  file   Social Determinants of Health   Financial Resource Strain: Not on file  Food Insecurity: Not on file  Transportation Needs: Not on file  Physical Activity: Not on file  Stress: Not on file  Social Connections: Not on file    Allergies:  Allergies  Allergen Reactions   Sulfa Antibiotics Anaphylaxis    Metabolic Disorder Labs: Lab Results  Component Value Date   HGBA1C 5.8 (H) 05/18/2020   MPG 119.76 05/18/2020   No results found for: "PROLACTIN" Lab Results  Component Value Date   CHOL 173 05/18/2020   TRIG 224 (H) 05/18/2020   HDL 37 (L) 05/18/2020   CHOLHDL 4.7 05/18/2020   VLDL 45 (H) 05/18/2020   LDLCALC 91 05/18/2020   Lab Results  Component Value Date   TSH 6.221 (H) 06/28/2021   TSH 3.038 05/17/2020    Therapeutic Level Labs: No results found for: "LITHIUM" No results found for: "VALPROATE" No results found for: "CBMZ"  Current Medications: Current Outpatient Medications  Medication Sig Dispense Refill   ELIQUIS 5 MG TABS tablet Take 5 mg by mouth  2 (two) times daily.     escitalopram (LEXAPRO) 20 MG tablet Take 1 tablet (20 mg total) by mouth at bedtime. 90 tablet 1   fluticasone (FLONASE) 50 MCG/ACT nasal spray Place into both nostrils.     loratadine (CLARITIN) 10 MG tablet Take 10 mg by mouth daily.     Multiple Vitamin (MULTIVITAMIN WITH MINERALS) TABS tablet Take 1 tablet by mouth daily.     risperiDONE (RISPERDAL) 1 MG tablet Take 1 tablet (1 mg total) by mouth 2 (two) times daily. 180 tablet 0   SYMBICORT 80-4.5 MCG/ACT inhaler Inhale into the lungs.     traZODone (DESYREL) 50 MG tablet Take 1 tablet (50 mg total) by mouth at bedtime. 90 tablet 1   triamcinolone (KENALOG) 0.025 % cream Apply topically.     VENTOLIN HFA 108 (90 Base) MCG/ACT inhaler Inhale 1-2 puffs into the lungs every 4 (four) hours as needed.     No current facility-administered medications for this visit.     Musculoskeletal: Strength & Muscle Tone: within normal  limits Gait & Station: normal Patient leans: N/A  Psychiatric Specialty Exam: Review of Systems  There were no vitals taken for this visit.There is no height or weight on file to calculate BMI.  General Appearance: {Appearance:22683}  Eye Contact:  {BHH EYE CONTACT:22684}  Speech:  Clear and Coherent  Volume:  Normal  Mood:  {BHH MOOD:22306}  Affect:  {Affect (PAA):22687}  Thought Process:  Coherent  Orientation:  Full (Time, Place, and Person)  Thought Content: Logical   Suicidal Thoughts:  {ST/HT (PAA):22692}  Homicidal Thoughts:  {ST/HT (PAA):22692}  Memory:  Immediate;   Good  Judgement:  {Judgement (PAA):22694}  Insight:  {Insight (PAA):22695}  Psychomotor Activity:  Normal  Concentration:  Concentration: Good and Attention Span: Good  Recall:  Good  Fund of Knowledge: Good  Language: Good  Akathisia:  No  Handed:  Right  AIMS (if indicated): not done  Assets:  Communication Skills Desire for Improvement  ADL's:  Intact  Cognition: WNL  Sleep:  {BHH GOOD/FAIR/POOR:22877}   Screenings: AUDIT    Flowsheet Row Admission (Discharged) from 07/02/2021 in [facility] Admission (Dis[facility]) [facility]  Alcohol Use Disorder Identification Test Final Score (AUDIT) 1 0      GAD-7    Flowsheet Row Office Visit from 01/28/2022 in [facility] Office Visit from 11/05/2021 in [facility] Office Visit from 09/10/2021 in [facility] Office Visit from 08/10/2021 in [facility]  Total GAD-7 Score 4 2 7 7       PHQ2-9    Flowsheet Row Office Visit from 01/28/2022 in [facility] Office Visit from 11/05/2021 in [facility] Office Visit from 09/10/2021 in [facility] Office Visit from 08/10/2021 in [facility]  PHQ-2 Total Score 1 1 2 1   PHQ-9 Total Score 3 5 8  --      Flowsheet Row Office Visit from 11/05/2021 in [facility] Office Visit from 09/10/2021 in [facility] Office Visit from 08/10/2021 in [facility]  C-SSRS RISK CATEGORY No Risk No Risk No Risk        Assessment and Plan:  Anna Adkins is a 57 y.o. year old female with  a history of depression, pulmonary emboli, DVT, TIA , who presents for follow up appointment for below.    1. MDD (major depressive disorder), recurrent episode, mild (HCC) 2. Anxiety state Acute stressors include: loss of her cousin at age 73 ("mentally challenged") friend at church, who she grows up with Chronic stressors include: mother in retirement home (will be moving into apartment with her sister), empty nest   History- admitted in 05/2020, found to be tangential, talking non sense, dx depression with psychosis, 06/2021 for disorganized thoughts, paranoia, hallucinations in the setting of COPD exacerbation   There has been worsening in low energy over the past several weeks since her daughter has moved back due to finger injury, although she enjoys her company.  Although it was discussed to consider adding bupropion to optimize treatment, she not interested in this intervention at this time.  She is willing to work on behavioral activation.  Will continue Lexapro to target depression and anxiety, along with Risperdal due to history of psychosis.     # Insomnia Slightly worsening.  She is unsure of snoring.  Although sleep evaluation was recommended, she would like to hold this at this time.  Will continue trazodone as needed for insomnia.    Plan  Continue Lexapro 20 mg daily  Continue Risperdal 1 mg twice a day- monitor  weight gain (EKG HR 96, QTC 439 msec, 06/2021)  Continue trazodone 50 mg at night as needed for insomnia Next appointment: 10/3 at 3:30, IP - will plan to check TSH if that is not done by PCP    The patient demonstrates the following risk factors for suicide: Chronic risk factors for suicide include: psychiatric disorder of depression . Acute risk factors for suicide include: family or marital conflict, unemployment, social withdrawal/isolation, and recent discharge from inpatient psychiatry. Protective factors for this patient include: positive social support, coping skills, and hope for the future. Considering these factors, the overall suicide risk at this point appears to be low. Patient is appropriate for outpatient follow up.       Collaboration of Care: Collaboration of Care: {BH OP Collaboration of Care:21014065}  Patient/Guardian was advised Release of Information must be obtained prior to any record release in order to collaborate their care with an outside provider. Patient/Guardian was advised if they have not already done so to contact the registration department to sign all necessary forms in order for [place] to release information regarding their care.   Consent: Patient/Guardian gives verbal consent for treatment and assignment of benefits for services provided during this visit. Patient/Guardian expressed understanding and agreed to proceed.    Neysa Hotter, MD 10/08/2022, 4:09 PM

## 2022-10-14 ENCOUNTER — Ambulatory Visit: Payer: Medicare Other | Admitting: Psychiatry

## 2022-11-01 ENCOUNTER — Other Ambulatory Visit: Payer: Self-pay | Admitting: Psychiatry

## 2022-11-28 NOTE — Progress Notes (Deleted)
BH MD/PA/NP OP Progress Note  11/28/2022 12:11 PM Anna Adkins  MRN:  [id number]  Chief Complaint: No chief complaint on file.  HPI: *** Visit Diagnosis: No diagnosis found.  Past Psychiatric History: Please see initial evaluation for full details. I have reviewed the history. No updates at this time.     Past Medical History:  Past Medical History:  Diagnosis Date   Anxiety    Anxiety    Arthritis    Blood clot in vein    Depression    Depression    DVT (deep venous thrombosis) (HCC)    DVT of leg (deep venous thrombosis) (HCC) 2014   Pulmonary emboli (HCC)    Stroke (HCC)    TIA (transient ischemic attack) 2014    Past Surgical History:  Procedure Laterality Date   Blood clot removal     ECTOPIC PREGNANCY SURGERY     HERNIA REPAIR     HERNIA REPAIR  2001   SKIN GRAFT     TUBAL LIGATION      Family Psychiatric History: Please see initial evaluation for full details. I have reviewed the history. No updates at this time.     Family History:  Family History  Problem Relation Age of Onset   Healthy Mother    Heart disease Father    Diabetes Father    Asthma Father    Breast cancer Neg Hx     Social History:  Social History   Socioeconomic History   Marital status: Single    Spouse name: Not on file   Number of children: 4   Years of education: Not on file   Highest education level: High school graduate  Occupational History   Not on file  Tobacco Use   Smoking status: Every Day    Current packs/day: 1.00    Average packs/day: 1 pack/day for 20.0 years (20.0 ttl pk-yrs)    Types: Cigarettes   Smokeless tobacco: Never  Vaping Use   Vaping status: Never Used  Substance and Sexual Activity   Alcohol use: Yes    Alcohol/week: 4.0 standard drinks of alcohol    Types: 4 Standard drinks or equivalent per week    Comment: occasional   Drug use: No   Sexual activity: Not Currently  Other Topics Concern   Not on file  Social History Narrative   Not  on file   Social Determinants of Health   Financial Resource Strain: Not on file  Food Insecurity: Not on file  Transportation Needs: Not on file  Physical Activity: Not on file  Stress: Not on file  Social Connections: Not on file    Allergies:  Allergies  Allergen Reactions   Sulfa Antibiotics Anaphylaxis    Metabolic Disorder Labs: Lab Results  Component Value Date   HGBA1C 5.8 (H) 05/18/2020   MPG 119.76 05/18/2020   No results found for: "PROLACTIN" Lab Results  Component Value Date   CHOL 173 05/18/2020   TRIG 224 (H) 05/18/2020   HDL 37 (L) 05/18/2020   CHOLHDL 4.7 05/18/2020   VLDL 45 (H) 05/18/2020   LDLCALC 91 05/18/2020   Lab Results  Component Value Date   TSH 6.221 (H) 06/28/2021   TSH 3.038 05/17/2020    Therapeutic Level Labs: No results found for: "LITHIUM" No results found for: "VALPROATE" No results found for: "CBMZ"  Current Medications: Current Outpatient Medications  Medication Sig Dispense Refill   ELIQUIS 5 MG TABS tablet Take 5 mg by mouth  2 (two) times daily.     escitalopram (LEXAPRO) 20 MG tablet Take 1 tablet (20 mg total) by mouth at bedtime. 90 tablet 1   fluticasone (FLONASE) 50 MCG/ACT nasal spray Place into both nostrils.     loratadine (CLARITIN) 10 MG tablet Take 10 mg by mouth daily.     Multiple Vitamin (MULTIVITAMIN WITH MINERALS) TABS tablet Take 1 tablet by mouth daily.     risperiDONE (RISPERDAL) 1 MG tablet Take 1 tablet (1 mg total) by mouth 2 (two) times daily. 180 tablet 0   SYMBICORT 80-4.5 MCG/ACT inhaler Inhale into the lungs.     traZODone (DESYREL) 50 MG tablet Take 1 tablet (50 mg total) by mouth at bedtime. 90 tablet 1   triamcinolone (KENALOG) 0.025 % cream Apply topically.     VENTOLIN HFA 108 (90 Base) MCG/ACT inhaler Inhale 1-2 puffs into the lungs every 4 (four) hours as needed.     No current facility-administered medications for this visit.     Musculoskeletal: Strength & Muscle Tone: within  normal limits Gait & Station: normal Patient leans: N/A  Psychiatric Specialty Exam: Review of Systems  There were no vitals taken for this visit.There is no height or weight on file to calculate BMI.  General Appearance: {Appearance:22683}  Eye Contact:  {BHH EYE CONTACT:22684}  Speech:  Clear and Coherent  Volume:  Normal  Mood:  {BHH MOOD:22306}  Affect:  {Affect (PAA):22687}  Thought Process:  Coherent  Orientation:  Full (Time, Place, and Person)  Thought Content: Logical   Suicidal Thoughts:  {ST/HT (PAA):22692}  Homicidal Thoughts:  {ST/HT (PAA):22692}  Memory:  Immediate;   Good  Judgement:  {Judgement (PAA):22694}  Insight:  {Insight (PAA):22695}  Psychomotor Activity:  Normal  Concentration:  Concentration: Good and Attention Span: Good  Recall:  Good  Fund of Knowledge: Good  Language: Good  Akathisia:  No  Handed:  Right  AIMS (if indicated): not done  Assets:  Communication Skills Desire for Improvement  ADL's:  Intact  Cognition: WNL  Sleep:  {BHH GOOD/FAIR/POOR:22877}   Screenings: AUDIT    Flowsheet Row Admission (Discharged) from 07/02/2021 in [facility]) [facility]  Alcohol Use Disorder Identification Test Final Score (AUDIT) 1 0      GAD-7    Flowsheet Row Office Visit from 01/28/2022 in [facility] Office Visit from 11/05/2021 in [facility] Office Visit from 09/10/2021 in [facility] Office Visit from 08/10/2021 in [facility]  Total GAD-7 Score 4 2 7 7       PHQ2-9    Flowsheet Row Office Visit from 01/28/2022 in [facility] Office Visit from 11/05/2021 in [facility] Office Visit from 09/10/2021 in [facility] Office Visit from 08/10/2021 in [facility]  PHQ-2 Total Score 1 1 2 1   PHQ-9 Total Score 3 5 8  --      Flowsheet Row Office Visit from 11/05/2021 in [facility] Office Visit from 09/10/2021 in [facility] Office Visit from 08/10/2021 in [facility]  C-SSRS RISK CATEGORY No Risk No Risk No Risk        Assessment and Plan:  Anna Adkins is a 56 y.o. year old female with  a history of depression, pulmonary emboli, DVT, TIA , who presents for follow up appointment for below.    1. MDD (major depressive disorder), recurrent episode, mild (HCC) 2. Anxiety state Acute stressors include: loss of her cousin at age 53 ("mentally challenged") friend at church, who she grows up with Chronic stressors include: mother in retirement home (will be moving into apartment with her sister), empty nest   History- admitted in 05/2020, found to be tangential, talking non sense, dx depression with psychosis, 06/2021 for disorganized thoughts, paranoia, hallucinations in the setting of COPD exacerbation   There has been worsening in low energy over the past several weeks since her daughter has moved back due to finger injury, although she enjoys her company.  Although it was discussed to consider adding bupropion to optimize treatment, she not interested in this intervention at this time.  She is willing to work on behavioral activation.  Will continue Lexapro to target depression and anxiety, along with Risperdal due to history of psychosis.     # Insomnia Slightly worsening.  She is unsure of snoring.  Although sleep evaluation was recommended, she would like to hold this at this time.  Will continue trazodone as needed for insomnia.    Plan  Continue Lexapro 20 mg daily  Continue Risperdal 1 mg twice a day-  monitor weight gain (EKG HR 96, QTC 439 msec, 06/2021)  Continue trazodone 50 mg at night as needed for insomnia Next appointment: 10/3 at 3:30, IP - will plan to check TSH if that is not done by PCP    The patient demonstrates the following risk factors for suicide: Chronic risk factors for suicide include: psychiatric disorder of depression . Acute risk factors for suicide include: family or marital conflict, unemployment, social withdrawal/isolation, and recent discharge from inpatient psychiatry. Protective factors for this patient include: positive social support, coping skills, and hope for the future. Considering these factors, the overall suicide risk at this point appears to be low. Patient is appropriate for outpatient follow up.     Collaboration of Care: Collaboration of Care: {BH OP Collaboration of Care:21014065}  Patient/Guardian was advised Release of Information must be obtained prior to any record release in order to collaborate their care with an outside provider. Patient/Guardian was advised if they have not already done so to contact the registration department to sign all necessary forms in order for [place] to release information regarding their care.   Consent: Patient/Guardian gives verbal consent for treatment and assignment of benefits for services provided during this visit. Patient/Guardian expressed understanding and agreed to proceed.    Neysa Hotter, MD 11/28/2022, 12:11 PM

## 2022-12-02 ENCOUNTER — Ambulatory Visit: Payer: Medicare Other | Admitting: Psychiatry

## 2023-01-22 NOTE — Progress Notes (Deleted)
 BH MD/PA/NP OP Progress Note  01/22/2023 3:01 PM Anna Adkins  MRN:  [id number]  Chief Complaint: No chief complaint on file.  HPI: *** - she is not seen since August 2024   Household:  daughter Marital status: single, never married Number of children:4 Employment: on disability for DVT in 2014. She used to work at [employer] in 2013 (the job was unbearable to her) Education:  high school  Visit Diagnosis: No diagnosis found.  Past Psychiatric History: Please see initial evaluation for full details. I have reviewed the history. No updates at this time.     Past Medical History:  Past Medical History:  Diagnosis Date   Anxiety    Anxiety    Arthritis    Blood clot in vein    Depression    Depression    DVT (deep venous thrombosis) (HCC)    DVT of leg (deep venous thrombosis) (HCC) 2014   Pulmonary emboli (HCC)    Stroke (HCC)    TIA (transient ischemic attack) 2014    Past Surgical History:  Procedure Laterality Date   Blood clot removal     ECTOPIC PREGNANCY SURGERY     HERNIA REPAIR     HERNIA REPAIR  2001   SKIN GRAFT     TUBAL LIGATION      Family Psychiatric History: Please see initial evaluation for full details. I have reviewed the history. No updates at this time.     Family History:  Family History  Problem Relation Age of Onset   Healthy Mother    Heart disease Father    Diabetes Father    Asthma Father    Breast cancer Neg Hx     Social History:  Social History   Socioeconomic History   Marital status: Single    Spouse name: Not on file   Number of children: 4   Years of education: Not on file   Highest education level: High school graduate  Occupational History   Not on file  Tobacco Use   Smoking status: Every Day    Current packs/day: 1.00    Average packs/day: 1 pack/day for 20.0 years (20.0 ttl pk-yrs)    Types: Cigarettes   Smokeless tobacco: Never  Vaping Use   Vaping status: Never Used  Substance and Sexual Activity    Alcohol use: Yes    Alcohol/week: 4.0 standard drinks of alcohol    Types: 4 Standard drinks or equivalent per week    Comment: occasional   Drug use: No   Sexual activity: Not Currently  Other Topics Concern   Not on file  Social History Narrative   Not on file   Social Drivers of Health   Financial Resource Strain: Not on file  Food Insecurity: Not on file  Transportation Needs: Not on file  Physical Activity: Not on file  Stress: Not on file  Social Connections: Not on file    Allergies:  Allergies  Allergen Reactions   Sulfa Antibiotics Anaphylaxis    Metabolic Disorder Labs: Lab Results  Component Value Date   HGBA1C 5.8 (H) 05/18/2020   MPG 119.76 05/18/2020   No results found for: PROLACTIN Lab Results  Component Value Date   CHOL 173 05/18/2020   TRIG 224 (H) 05/18/2020   HDL 37 (L) 05/18/2020   CHOLHDL 4.7 05/18/2020   VLDL 45 (H) 05/18/2020   LDLCALC 91 05/18/2020   Lab Results  Component Value Date   TSH 6.221 (H) 06/28/2021  TSH 3.038 05/17/2020    Therapeutic Level Labs: No results found for: LITHIUM No results found for: VALPROATE No results found for: CBMZ  Current Medications: Current Outpatient Medications  Medication Sig Dispense Refill   ELIQUIS  5 MG TABS tablet Take 5 mg by mouth 2 (two) times daily.     escitalopram  (LEXAPRO ) 20 MG tablet Take 1 tablet (20 mg total) by mouth at bedtime. 90 tablet 1   fluticasone  (FLONASE ) 50 MCG/ACT nasal spray Place into both nostrils.     loratadine  (CLARITIN ) 10 MG tablet Take 10 mg by mouth daily.     Multiple Vitamin (MULTIVITAMIN WITH MINERALS) TABS tablet Take 1 tablet by mouth daily.     risperiDONE  (RISPERDAL ) 1 MG tablet Take 1 tablet (1 mg total) by mouth 2 (two) times daily. 180 tablet 0   SYMBICORT 80-4.5 MCG/ACT inhaler Inhale into the lungs.     traZODone  (DESYREL ) 50 MG tablet Take 1 tablet (50 mg total) by mouth at bedtime. 90 tablet 1   triamcinolone (KENALOG) 0.025 %  cream Apply topically.     VENTOLIN  HFA 108 (90 Base) MCG/ACT inhaler Inhale 1-2 puffs into the lungs every 4 (four) hours as needed.     No current facility-administered medications for this visit.     Musculoskeletal: Strength & Muscle Tone: within normal limits Gait & Station: normal Patient leans: N/A  Psychiatric Specialty Exam: Review of Systems  There were no vitals taken for this visit.There is no height or weight on file to calculate BMI.  General Appearance: {Appearance:22683}  Eye Contact:  {BHH EYE CONTACT:22684}  Speech:  Clear and Coherent  Volume:  Normal  Mood:  {BHH MOOD:22306}  Affect:  {Affect (PAA):22687}  Thought Process:  Coherent  Orientation:  Full (Time, Place, and Person)  Thought Content: Logical   Suicidal Thoughts:  {ST/HT (PAA):22692}  Homicidal Thoughts:  {ST/HT (PAA):22692}  Memory:  Immediate;   Good  Judgement:  {Judgement (PAA):22694}  Insight:  {Insight (PAA):22695}  Psychomotor Activity:  Normal  Concentration:  Concentration: Good and Attention Span: Good  Recall:  Good  Fund of Knowledge: Good  Language: Good  Akathisia:  No  Handed:  Right  AIMS (if indicated): not done  Assets:  Communication Skills Desire for Improvement  ADL's:  Intact  Cognition: WNL  Sleep:  {BHH GOOD/FAIR/POOR:22877}   Screenings: AUDIT    Flowsheet Row Admission (Discharged) from 07/02/2021 in [facility] Admission (Discharged[facility]  Alcohol Use Disorder Identification Test Final Score (AUDIT) 1 0      GAD-7    Flowsheet Row Office Visit from 01/28/2022 in [facility] Office Visit from 11/05/2021 in [facility] Office Visit from 09/10/2021 in [facility] Office Visit from 08/10/2021 in [facility]  Total GAD-7  Score 4 2 7 7       PHQ2-9    Flowsheet Row Office Visit from 01/28/2022 in [facility] Office Visit from 11/05/2021 in [facility] Office Visit from 09/10/2021 in [facility] Office Visit from 08/10/2021 in [facility]  PHQ-2 Total Score 1 1 2 1   PHQ-9 Total Score 3 5 8  --      Flowsheet Row Office Visit from 11/05/2021 in [facility] Office Visit from 09/10/2021 in [facility] Office Visit from 08/10/2021 in [facility]  C-SSRS RISK CATEGORY No Risk No Risk No Risk        Assessment and Plan:  Anna Adkins is a 58 y.o. year old female with a history of depression, pulmonary emboli, DVT, TIA , who presents for follow up appointment for below.    1. MDD (major depressive disorder), recurrent episode, mild (HCC) 2. Anxiety state Acute stressors include: loss of her cousin at age 40 (mentally challenged) friend at church, who she grows up with Chronic stressors include: mother in retirement home (will be moving into apartment with her sister), empty nest   History- admitted in 05/2020, found to be tangential, talking non sense, dx depression with psychosis, 06/2021 for disorganized thoughts, paranoia, hallucinations in the setting of COPD exacerbation   There has been worsening in low energy over the past several weeks since her daughter has moved back due to finger injury, although she enjoys her company.  Although it was discussed to consider adding bupropion to optimize treatment, she not interested in this intervention at this time.  She is willing to work on behavioral activation.  Will continue Lexapro  to target depression and anxiety, along with Risperdal  due to history of psychosis.     #  Insomnia Slightly worsening.  She is unsure of snoring.  Although sleep evaluation was recommended, she would like to hold this at this time.  Will continue trazodone  as needed for insomnia.    Plan  Continue Lexapro  20 mg daily  Continue Risperdal  1 mg twice a day- monitor weight gain (EKG HR 96, QTC 439 msec, 06/2021)  Continue trazodone  50 mg at night as needed for insomnia Next appointment: 10/3 at 3:30, IP - will plan to check TSH if that is not done by PCP    The patient demonstrates the following risk factors for suicide: Chronic risk factors for suicide include: psychiatric disorder of depression . Acute risk factors for suicide include: family or marital conflict, unemployment, social withdrawal/isolation, and recent discharge from inpatient psychiatry. Protective factors for this patient include: positive social support, coping skills, and hope for the future. Considering these factors, the overall suicide risk at this point appears to be low. Patient is appropriate for outpatient follow up.     Collaboration of Care: Collaboration of Care: {BH OP Collaboration of Care:21014065}  Patient/Guardian was advised Release of Information must be obtained prior to any record release in order to collaborate their care with an outside provider. Patient/Guardian was advised if they have not already done so to contact the registration department to sign all necessary forms in order for us  to release information regarding their care.   Consent: Patient/Guardian gives verbal consent for treatment and assignment of benefits for services provided during this visit. Patient/Guardian expressed understanding and agreed to proceed.    Katheren Sleet, MD 01/22/2023, 3:01 PM

## 2023-01-25 ENCOUNTER — Ambulatory Visit: Payer: Medicare Other | Admitting: Psychiatry

## 2023-02-11 ENCOUNTER — Other Ambulatory Visit: Payer: Self-pay | Admitting: Psychiatry

## 2023-02-14 NOTE — Telephone Encounter (Signed)
Please contact her to make an appointment, and let me know. She has not seen since last August.

## 2023-02-14 NOTE — Telephone Encounter (Signed)
There is an appointment on the schedule for in person for 03-10-23

## 2023-03-06 NOTE — Progress Notes (Deleted)
 BH MD/PA/NP OP Progress Note  03/06/2023 4:24 AM Anna Adkins  MRN:  [id number]  Chief Complaint: No chief complaint on file.  HPI: ***   TSH  Household:  daughter Marital status: single, never married Number of children:4 Employment: on disability for DVT in 2014. She used to work at [employer] in 2013 (the job was "unbearable" to her) Education:  high school  Visit Diagnosis: No diagnosis found.  Past Psychiatric History: Please see initial evaluation for full details. I have reviewed the history. No updates at this time.     Past Medical History:  Past Medical History:  Diagnosis Date   Anxiety    Anxiety    Arthritis    Blood clot in vein    Depression    Depression    DVT (deep venous thrombosis) (HCC)    DVT of leg (deep venous thrombosis) (HCC) 2014   Pulmonary emboli (HCC)    Stroke (HCC)    TIA (transient ischemic attack) 2014    Past Surgical History:  Procedure Laterality Date   Blood clot removal     ECTOPIC PREGNANCY SURGERY     HERNIA REPAIR     HERNIA REPAIR  2001   SKIN GRAFT     TUBAL LIGATION      Family Psychiatric History: Please see initial evaluation for full details. I have reviewed the history. No updates at this time.     Family History:  Family History  Problem Relation Age of Onset   Healthy Mother    Heart disease Father    Diabetes Father    Asthma Father    Breast cancer Neg Hx     Social History:  Social History   Socioeconomic History   Marital status: Single    Spouse name: Not on file   Number of children: 4   Years of education: Not on file   Highest education level: High school graduate  Occupational History   Not on file  Tobacco Use   Smoking status: Every Day    Current packs/day: 1.00    Average packs/day: 1 pack/day for 20.0 years (20.0 ttl pk-yrs)    Types: Cigarettes   Smokeless tobacco: Never  Vaping Use   Vaping status: Never Used  Substance and Sexual Activity   Alcohol use: Yes     Alcohol/week: 4.0 standard drinks of alcohol    Types: 4 Standard drinks or equivalent per week    Comment: occasional   Drug use: No   Sexual activity: Not Currently  Other Topics Concern   Not on file  Social History Narrative   Not on file   Social Drivers of Health   Financial Resource Strain: Not on file  Food Insecurity: Not on file  Transportation Needs: Not on file  Physical Activity: Not on file  Stress: Not on file  Social Connections: Not on file    Allergies:  Allergies  Allergen Reactions   Sulfa Antibiotics Anaphylaxis    Metabolic Disorder Labs: Lab Results  Component Value Date   HGBA1C 5.8 (H) 05/18/2020   MPG 119.76 05/18/2020   No results found for: "PROLACTIN" Lab Results  Component Value Date   CHOL 173 05/18/2020   TRIG 224 (H) 05/18/2020   HDL 37 (L) 05/18/2020   CHOLHDL 4.7 05/18/2020   VLDL 45 (H) 05/18/2020   LDLCALC 91 05/18/2020   Lab Results  Component Value Date   TSH 6.221 (H) 06/28/2021   TSH 3.038 05/17/2020  Therapeutic Level Labs: No results found for: "LITHIUM" No results found for: "VALPROATE" No results found for: "CBMZ"  Current Medications: Current Outpatient Medications  Medication Sig Dispense Refill   ELIQUIS 5 MG TABS tablet Take 5 mg by mouth 2 (two) times daily.     escitalopram (LEXAPRO) 20 MG tablet Take 1 tablet (20 mg total) by mouth at bedtime. 90 tablet 1   fluticasone (FLONASE) 50 MCG/ACT nasal spray Place into both nostrils.     loratadine (CLARITIN) 10 MG tablet Take 10 mg by mouth daily.     Multiple Vitamin (MULTIVITAMIN WITH MINERALS) TABS tablet Take 1 tablet by mouth daily.     risperiDONE (RISPERDAL) 1 MG tablet Take 1 tablet (1 mg total) by mouth 2 (two) times daily. 60 tablet 0   SYMBICORT 80-4.5 MCG/ACT inhaler Inhale into the lungs.     traZODone (DESYREL) 50 MG tablet Take 1 tablet (50 mg total) by mouth at bedtime. 90 tablet 1   triamcinolone (KENALOG) 0.025 % cream Apply topically.      VENTOLIN HFA 108 (90 Base) MCG/ACT inhaler Inhale 1-2 puffs into the lungs every 4 (four) hours as needed.     No current facility-administered medications for this visit.     Musculoskeletal: Strength & Muscle Tone: within normal limits Gait & Station: normal Patient leans: N/A  Psychiatric Specialty Exam: Review of Systems  There were no vitals taken for this visit.There is no height or weight on file to calculate BMI.  General Appearance: {Appearance:22683}  Eye Contact:  {BHH EYE CONTACT:22684}  Speech:  Clear and Coherent  Volume:  Normal  Mood:  {BHH MOOD:22306}  Affect:  {Affect (PAA):22687}  Thought Process:  Coherent  Orientation:  Full (Time, Place, and Person)  Thought Content: Logical   Suicidal Thoughts:  {ST/HT (PAA):22692}  Homicidal Thoughts:  {ST/HT (PAA):22692}  Memory:  Immediate;   Good  Judgement:  {Judgement (PAA):22694}  Insight:  {Insight (PAA):22695}  Psychomotor Activity:  Normal  Concentration:  Concentration: Good and Attention Span: Good  Recall:  Good  Fund of Knowledge: Good  Language: Good  Akathisia:  No  Handed:  Right  AIMS (if indicated): not done  Assets:  Communication Skills Desire for Improvement  ADL's:  Intact  Cognition: WNL  Sleep:  {B[facility] GOOD/FAIR/POOR:22877}   Screenings: AUDIT    Flowsheet Row Admission (Discharged) from 07/02/2021 in [facility]  Alcohol Use Disorder Identification Test Final Score (AUDIT) 1 0      GAD-7    Flowsheet Row Office Visit from 01/28/2022 in [facility] Office Visit from 11/05/2021 in [facility] Office Visit from 09/10/2021 in [facility] Office Visit from 08/10/2021 in [facility]  Total GAD-7 Score 4 2 7 7        PHQ2-9    Flowsheet Row Office Visit from 01/28/2022 in [facility] Office Visit from 11/05/2021 in [facility] Office Visit from 09/10/2021 in [facility] Office Visit from 08/10/2021 in [facility]  PHQ-2 Total Score 1 1 2 1   PHQ-9 Total Score 3 5 8  --      Flowsheet Row Office Visit from 11/05/2021 in [facility] Office Visit from 09/10/2021 in [facility] Office Visit  from 08/10/2021 in [facility]  C-SSRS RISK CATEGORY No Risk No Risk No Risk        Assessment and Plan:  Anna Adkins is a 58 y.o. year old female with a history of depression, pulmonary emboli, DVT, TIA , who presents for follow up appointment for below.    1. MDD (major depressive disorder), recurrent episode, mild (HCC) 2. Anxiety state Acute stressors include: loss of her cousin at age 61 ("mentally challenged") friend at church, who she grows up with Chronic stressors include: mother in retirement home (will be moving into apartment with her sister), empty nest   History- admitted in 05/2020, found to be tangential, talking non sense, dx depression with psychosis, 06/2021 for disorganized thoughts, paranoia, hallucinations in the setting of COPD exacerbation   There has been worsening in low energy over the past several weeks since her daughter has moved back due to finger injury, although she enjoys her company.  Although it was discussed to consider adding bupropion to optimize treatment, she not interested in this intervention at this time.  She is willing to work on behavioral activation.  Will continue Lexapro to target depression and anxiety, along with Risperdal due to history of psychosis.     # Insomnia Slightly worsening.  She  is unsure of snoring.  Although sleep evaluation was recommended, she would like to hold this at this time.  Will continue trazodone as needed for insomnia.    Plan  Continue Lexapro 20 mg daily  Continue Risperdal 1 mg twice a day- monitor weight gain (EKG HR 96, QTC 439 msec, 06/2021)  Continue trazodone 50 mg at night as needed for insomnia Next appointment: 10/3 at 3:30, IP - will plan to check TSH if that is not done by PCP    The patient demonstrates the following risk factors for suicide: Chronic risk factors for suicide include: psychiatric disorder of depression . Acute risk factors for suicide include: family or marital conflict, unemployment, social withdrawal/isolation, and recent discharge from inpatient psychiatry. Protective factors for this patient include: positive social support, coping skills, and hope for the future. Considering these factors, the overall suicide risk at this point appears to be low. Patient is appropriate for outpatient follow up.     Collaboration of Care: Collaboration of Care: {BH OP Collaboration of Care:21014065}  Patient/Guardian was advised Release of Information must be obtained prior to any record release in order to collaborate their care with an outside provider. Patient/Guardian was advised if they have not already done so to contact the registration department to sign all necessary forms in order for [place] to release information regarding their care.   Consent: Patient/Guardian gives verbal consent for treatment and assignment of benefits for services provided during this visit. Patient/Guardian expressed understanding and agreed to proceed.    Neysa Hotter, MD 03/06/2023, 4:24 AM

## 2023-03-10 ENCOUNTER — Ambulatory Visit: Payer: Medicare Other | Admitting: Psychiatry

## 2023-04-11 ENCOUNTER — Emergency Department

## 2023-04-11 ENCOUNTER — Emergency Department
Admission: EM | Admit: 2023-04-11 | Discharge: 2023-04-11 | Disposition: A | Discharge status: Home / Self Care | Attending: Emergency Medicine | Admitting: Emergency Medicine

## 2023-04-11 ENCOUNTER — Other Ambulatory Visit: Payer: Self-pay

## 2023-04-11 DIAGNOSIS — R5383 Other fatigue: Secondary | ICD-10-CM | POA: Insufficient documentation

## 2023-04-11 DIAGNOSIS — Z8673 Personal history of transient ischemic attack (TIA), and cerebral infarction without residual deficits: Secondary | ICD-10-CM | POA: Diagnosis not present

## 2023-04-11 DIAGNOSIS — R110 Nausea: Secondary | ICD-10-CM | POA: Insufficient documentation

## 2023-04-11 DIAGNOSIS — R202 Paresthesia of skin: Secondary | ICD-10-CM | POA: Diagnosis not present

## 2023-04-11 DIAGNOSIS — Z79899 Other long term (current) drug therapy: Secondary | ICD-10-CM | POA: Diagnosis not present

## 2023-04-11 DIAGNOSIS — R200 Anesthesia of skin: Secondary | ICD-10-CM | POA: Insufficient documentation

## 2023-04-11 DIAGNOSIS — Z7901 Long term (current) use of anticoagulants: Secondary | ICD-10-CM | POA: Diagnosis not present

## 2023-04-11 DIAGNOSIS — Y900 Blood alcohol level of less than 20 mg/100 ml: Secondary | ICD-10-CM | POA: Insufficient documentation

## 2023-04-11 DIAGNOSIS — Z91148 Patient's other noncompliance with medication regimen for other reason: Secondary | ICD-10-CM | POA: Diagnosis not present

## 2023-04-11 LAB — COMPREHENSIVE METABOLIC PANEL WITH GFR
ALT: 9 U/L (ref 0–44)
AST: 13 U/L — ABNORMAL LOW (ref 15–41)
Albumin: 3.9 g/dL (ref 3.5–5.0)
Alkaline Phosphatase: 60 U/L (ref 38–126)
Anion gap: 10 (ref 5–15)
BUN: 5 mg/dL — ABNORMAL LOW (ref 6–20)
CO2: 28 mmol/L (ref 22–32)
Calcium: 9.4 mg/dL (ref 8.9–10.3)
Chloride: 102 mmol/L (ref 98–111)
Creatinine, Ser: 0.76 mg/dL (ref 0.44–1.00)
GFR, Estimated: >60 mL/min (ref >60)
Glucose, Bld: 98 mg/dL (ref 70–99)
Potassium: 3.3 mmol/L — ABNORMAL LOW (ref 3.5–5.1)
Sodium: 140 mmol/L (ref 135–145)
Total Bilirubin: 0.9 mg/dL (ref 0.0–1.2)
Total Protein: 7.5 g/dL (ref 6.5–8.1)

## 2023-04-11 LAB — CBC WITH DIFFERENTIAL/PLATELET
Abs Immature Granulocytes: 0.01 10*3/uL (ref 0.00–0.07)
Basophils Absolute: 0.1 10*3/uL (ref 0.0–0.1)
Basophils Relative: 1 %
Eosinophils Absolute: 0.1 10*3/uL (ref 0.0–0.5)
Eosinophils Relative: 2 %
HCT: 50 % — ABNORMAL HIGH (ref 36.0–46.0)
Hemoglobin: 16.8 g/dL — ABNORMAL HIGH (ref 12.0–15.0)
Immature Granulocytes: 0 %
Lymphocytes Relative: 40 %
Lymphs Abs: 2.6 10*3/uL (ref 0.7–4.0)
MCH: 30.3 pg (ref 26.0–34.0)
MCHC: 33.6 g/dL (ref 30.0–36.0)
MCV: 90.3 fL (ref 80.0–100.0)
Monocytes Absolute: 0.4 10*3/uL (ref 0.1–1.0)
Monocytes Relative: 7 %
Neutro Abs: 3.2 10*3/uL (ref 1.7–7.7)
Neutrophils Relative %: 50 %
Platelets: 251 10*3/uL (ref 150–400)
RBC: 5.54 MIL/uL — ABNORMAL HIGH (ref 3.87–5.11)
RDW: 13.1 % (ref 11.5–15.5)
WBC: 6.4 10*3/uL (ref 4.0–10.5)
nRBC: 0 % (ref 0.0–0.2)

## 2023-04-11 LAB — PROTIME-INR
INR: 1.3 — ABNORMAL HIGH (ref 0.8–1.2)
Prothrombin Time: 16 s — ABNORMAL HIGH (ref 11.4–15.2)

## 2023-04-11 LAB — ETHANOL: Alcohol, Ethyl (B): <10 mg/dL (ref <10)

## 2023-04-11 LAB — APTT: aPTT: 36 s (ref 24–36)

## 2023-04-11 NOTE — ED Provider Notes (Signed)
 [facility] Provider Note   Event Date/Time   First MD Initiated Contact with Patient 04/11/23 2155     (approximate) History  Hand Pain and Nausea  HPI Anna Adkins is a 58 y.o. female with a stated past medical history of CVA in 2014, anxiety/depression, and history of pulmonary emboli on Eliquis who presents from home complaining of right hand numbness and generalized fatigue.  Patient states that she stopped taking a lot of her medications recently including her risperidone due to "forgetfulness".  Daughter states that patient had a slight droop of the left eyebrow but denies any full facial droop, difficulty speaking, or any eyelid abnormalities ROS: Patient currently denies any vision changes, tinnitus, difficulty speaking, facial droop, sore throat, chest pain, shortness of breath, abdominal pain, nausea/vomiting/diarrhea, dysuria, or weakness in any extremity   Physical Exam  Triage Vital Signs: ED Triage Vitals  Encounter Vitals Group     BP 04/11/23 1756 117/77     Systolic BP Percentile --      Diastolic BP Percentile --      Pulse Rate 04/11/23 1756 95     Resp 04/11/23 1756 18     Temp 04/11/23 1756 98 F (36.7 C)     Temp Source 04/11/23 2220 Oral     SpO2 04/11/23 1756 94 %     Weight --      Height --      Head Circumference --      Peak Flow --      Pain Score 04/11/23 1750 0     Pain Loc --      Pain Education --      Exclude from Growth Chart --    Most recent vital signs: Vitals:   04/11/23 1756 04/11/23 2220  BP: 117/77 107/69  Pulse: 95 65  Resp: 18 17  Temp: 98 F (36.7 C) 97.7 F (36.5 C)  SpO2: 94% 95%   General: Awake, oriented x4. CV:  Good peripheral perfusion.  Resp:  Normal effort.  Abd:  No distention.  Other:  Middle-age well-developed, well-nourished Caucasian female resting comfortably in no acute distress.  Subjective decreased sensation over the right hand ED Results / Procedures / Treatments   Labs (all labs ordered are listed, but only abnormal results are displayed) Labs Reviewed  CBC WITH DIFFERENTIAL/PLATELET - Abnormal; Notable for the following components:      Result Value   RBC 5.54 (*)    Hemoglobin 16.8 (*)    HCT 50.0 (*)    All other components within normal limits  PROTIME-INR - Abnormal; Notable for the following components:   Prothrombin Time 16.0 (*)    INR 1.3 (*)    All other components within normal limits  COMPREHENSIVE METABOLIC PANEL WITH GFR - Abnormal; Notable for the following components:   Potassium 3.3 (*)    BUN 5 (*)    AST 13 (*)    All other components within normal limits  ETHANOL  APTT  URINE DRUG SCREEN, QUALITATIVE ([facility] ONLY)  URINALYSIS, ROUTINE W REFLEX MICROSCOPIC   EKG ED ECG REPORT I, Merwyn Katos, the attending physician, personally viewed and interpreted this ECG. Date: 04/11/2023 EKG Time: 1800 Rate: 76 Rhythm: normal sinus rhythm QRS Axis: normal Intervals: normal ST/T Wave abnormalities: normal Narrative Interpretation: no evidence of acute ischemia RADIOLOGY ED MD interpretation: CT of the head without contrast interpreted by me shows no evidence of acute abnormalities including no intracerebral hemorrhage, obvious  masses, or significant edema.  There is sequelae from previous CVA as remote left parietal white matter infarct -Agree with radiology assessment Official radiology report(s): CT HEAD WO CONTRAST Result Date: 04/11/2023 CLINICAL DATA:  Neuro deficit, acute, stroke suspected. EXAM: CT HEAD WITHOUT CONTRAST TECHNIQUE: Contiguous axial images were obtained from the base of the skull through the vertex without intravenous contrast. RADIATION DOSE REDUCTION: This exam was performed according to the departmental dose-optimization program which includes automated exposure control, adjustment of the mA and/or kV according to patient size and/or use of iterative reconstruction technique. COMPARISON:  CT head without  contrast 05/14/2020 FINDINGS: Brain: No acute infarct, hemorrhage, or mass lesion is present. A remote left parietal white matter infarct is stable adjacent to the posterior aspect of the left lateral ventricle. Mild ex vacuo dilation is associated. No other significant white matter disease is present. Deep brain nuclei are within normal limits. No significant extraaxial fluid collection is present. The brainstem and cerebellum are within normal limits. Midline structures are within normal limits. Vascular: Atherosclerotic calcifications are present within the cavernous internal carotid arteries bilaterally. No hyperdense vessel is present. Skull: Calvarium is intact. No focal lytic or blastic lesions are present. No significant extracranial soft tissue lesion is present. Sinuses/Orbits: Mild mucosal thickening is present in the right frontal ethmoidal recess. By the paranasal sinuses are otherwise clear. Bilateral mastoid effusions are present. A small amount of fluid is present in the posterior right middle ear cavity. No obstructing nasopharyngeal lesion is present. The globes and orbits are within normal limits. IMPRESSION: 1. No acute intracranial abnormality or significant interval change. 2. Stable remote left parietal white matter infarct. 3. Bilateral mastoid effusions. 4. Small amount of fluid in the posterior right middle ear cavity. No obstructing nasopharyngeal lesion is present. Electronically Signed   By: Marin Roberts M.D.   On: 04/11/2023 18:56   PROCEDURES: Critical Care performed: No Procedures MEDICATIONS ORDERED IN ED: Medications - No data to display IMPRESSION / MDM / ASSESSMENT AND PLAN / ED COURSE  I reviewed the triage vital signs and the nursing notes.                             The patient is on the cardiac monitor to evaluate for evidence of arrhythmia and/or significant heart rate changes. Patient's presentation is most consistent with acute presentation with  potential threat to life or bodily function. Presents with sensation of tingling to the right hand.  Patient's symptoms and work-up not consistent with a stroke and therefore they will be discharged from the ED.  Patient has currently been stabilized in the emergency department.  Patient received an head CT that was negative for stroke.  Patient's symptoms not typical for other emergent causes such as dissection, infection, DKA, trauma. Patient will be discharged with strict return precautions and follow up with primary MD within 24 hours for further evaluation.   FINAL CLINICAL IMPRESSION(S) / ED DIAGNOSES   Final diagnoses:  Numbness and tingling of right hand   Rx / DC Orders   ED Discharge Orders     None      Note:  This document was prepared using Dragon voice recognition software and may include unintentional dictation errors.   Merwyn Katos, MD 04/11/23 [phone]

## 2023-04-11 NOTE — ED Provider Triage Note (Signed)
 Emergency Medicine Provider Triage Evaluation Note  KALIYA SHREINER , a 58 y.o. female  was evaluated in triage.  Pt complains of left hand numbness and overall not feeling well.  Patient has recently stopped taking her medications including lexapro, risperdol, and trazadone.   Daughter also concerned that she has some facial droop. LKW was yesterday.  Review of Systems  Positive: Left hand numbness, cough, nausea, fatigue Negative:   Physical Exam  There were no vitals taken for this visit. Gen:   Awake, no distress   Resp:  Normal effort  MSK:   Moves extremities without difficulty  Other:  Left sided facial droop  Medical Decision Making  Medically screening exam initiated at 5:50 PM.  Appropriate orders placed.  KARILYN WIND was informed that the remainder of the evaluation will be completed by another provider, this initial triage assessment does not replace that evaluation, and the importance of remaining in the ED until their evaluation is complete.     Cameron Ali, PA-C 04/11/23 1757

## 2023-04-11 NOTE — ED Triage Notes (Addendum)
 Pt comes with left hand numbness. Pt states this started few days ago. Pt states she just doesn't feel good. Pt states nausea and fatigue.   Pt states she has not been taking some of her meds due to forgetting. Pt is on eliquis and has been taking that.  Daughter states pt has had slight droop to left sided face. Pt does have little droop noted. Pt denies any slurred speech. Daughter states she noticed it today when she got home from work. Pt was sleeping when daughter went to work.

## 2023-04-19 ENCOUNTER — Telehealth: Payer: Self-pay | Admitting: Psychiatry

## 2023-04-19 ENCOUNTER — Other Ambulatory Visit: Payer: Self-pay | Admitting: Psychiatry

## 2023-04-19 MED ORDER — ESCITALOPRAM OXALATE 20 MG PO TABS: 20.0000 mg | ORAL_TABLET | Freq: Every day | ORAL | 1 refills | Status: DC

## 2023-04-19 MED ORDER — RISPERIDONE 1 MG PO TABS: 1.0000 mg | ORAL_TABLET | Freq: Two times a day (BID) | ORAL | 1 refills | Status: DC

## 2023-04-19 NOTE — Telephone Encounter (Signed)
 Ordered

## 2023-05-13 NOTE — Progress Notes (Unsigned)
 [facility] MD/PA/NP OP Progress Note  05/17/2023 3:45 PM Anna Adkins  MRN:  [id number]  Chief Complaint:  Chief Complaint  Patient presents with   Follow-up   HPI:  - Anna Adkins is not seen since August 2024 This is a follow-up appointment for depression, anxiety and insomnia.  Anna Adkins states that Anna Adkins has been doing good.  Anna Adkins could not come to the office due to issues with transportation.  Her mother brought her today.  Her daughter continues to live with her.  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 Anna Adkins sees her mother often, and reports good relationship with her.  Anna Adkins is hoping to go out more often during summer as Anna Adkins will have more opportunity to use a vehicle, which Anna Adkins shares with her daughter.  Anna Adkins spends time, watching TV.  Anna Adkins occasionally feels lonely when her daughter goes out with her friends.  However, Anna Adkins does not feel depressed as Anna Adkins used to.  Anna Adkins also talks with her friend on the phone.  Anna Adkins tries to do something to stay connected with others.  Anna Adkins reports estranged relationship with 2 of her children.  Anna Adkins prays for them.  Anna Adkins denies anxiety except Anna Adkins was feeling antsy about the storm.  Anna Adkins has slight decrease in appetite. The patient has mood symptoms as in PHQ-9/GAD-7. Anna Adkins denies SI. HI, hallucinations.  Anna Adkins sleeps up to 10 hours.  Anna Adkins denies alcohol use or drug use.  Wt Readings from Last 3 Encounters:  05/17/23 166 lb (75.3 kg)  08/30/22 207 lb 9.6 oz (94.2 kg)  06/29/22 218 lb (98.9 kg)     Household:  daughter Marital status: single, never married Number of children:4 Employment: on disability for DVT in 2014. Anna Adkins used to work at [employer] in 2013 (the job was "unbearable" to her) Education:  high school  Visit Diagnosis:    ICD-10-CM   1. MDD (major depressive disorder), recurrent episode, mild (HCC)  F33.0 TSH    T4, free    2. Anxiety state  F41.1     3. High risk medication use  Z79.899 Prolactin      Past Psychiatric History: Please see initial evaluation for full details. I  have reviewed the history. No updates at this time.     Past Medical History:  Past Medical History:  Diagnosis Date   Anxiety    Anxiety    Arthritis    Blood clot in vein    Depression    Depression    DVT (deep venous thrombosis) (HCC)    DVT of leg (deep venous thrombosis) (HCC) 2014   Pulmonary emboli (HCC)    Stroke (HCC)    TIA (transient ischemic attack) 2014    Past Surgical History:  Procedure Laterality Date   Blood clot removal     ECTOPIC PREGNANCY SURGERY     HERNIA REPAIR     HERNIA REPAIR  2001   SKIN GRAFT     TUBAL LIGATION      Family Psychiatric History: Please see initial evaluation for full details. I have reviewed the history. No updates at this time.     Family History:  Family History  Problem Relation Age of Onset   Healthy Mother    Heart disease Father    Diabetes Father    Asthma Father    Breast cancer Neg Hx     Social History:  Social History   Socioeconomic History   Marital status: Single    Spouse name: Not on file  Number of children: 4   Years of education: Not on file   Highest education level: High school graduate  Occupational History   Not on file  Tobacco Use   Smoking status: Every Day    Current packs/day: 1.00    Average packs/day: 1 pack/day for 20.0 years (20.0 ttl pk-yrs)    Types: Cigarettes   Smokeless tobacco: Never  Vaping Use   Vaping status: Never Used  Substance and Sexual Activity   Alcohol use: Yes    Alcohol/week: 4.0 standard drinks of alcohol    Types: 4 Standard drinks or equivalent per week    Comment: occasional   Drug use: No   Sexual activity: Not Currently  Other Topics Concern   Not on file  Social History Narrative   Not on file   Social Drivers of Health   Financial Resource Strain: Not on file  Food Insecurity: Not on file  Transportation Needs: Not on file  Physical Activity: Not on file  Stress: Not on file  Social Connections: Not on file    Allergies:   Allergies  Allergen Reactions   Sulfa Antibiotics Anaphylaxis    Metabolic Disorder Labs: Lab Results  Component Value Date   HGBA1C 5.8 (H) 05/18/2020   MPG 119.76 05/18/2020   No results found for: "PROLACTIN" Lab Results  Component Value Date   CHOL 173 05/18/2020   TRIG 224 (H) 05/18/2020   HDL 37 (L) 05/18/2020   CHOLHDL 4.7 05/18/2020   VLDL 45 (H) 05/18/2020   LDLCALC 91 05/18/2020   Lab Results  Component Value Date   TSH 6.221 (H) 06/28/2021   TSH 3.038 05/17/2020    Therapeutic Level Labs: No results found for: "LITHIUM" No results found for: "VALPROATE" No results found for: "CBMZ"  Current Medications: Current Outpatient Medications  Medication Sig Dispense Refill   ELIQUIS  5 MG TABS tablet Take 5 mg by mouth 2 (two) times daily.     fluticasone  (FLONASE ) 50 MCG/ACT nasal spray Place into both nostrils.     loratadine  (CLARITIN ) 10 MG tablet Take 10 mg by mouth daily.     Multiple Vitamin (MULTIVITAMIN WITH MINERALS) TABS tablet Take 1 tablet by mouth daily.     SYMBICORT 80-4.5 MCG/ACT inhaler Inhale into the lungs.     triamcinolone (KENALOG) 0.025 % cream Apply topically.     VENTOLIN  HFA 108 (90 Base) MCG/ACT inhaler Inhale 1-2 puffs into the lungs every 4 (four) hours as needed.     [START ON 06/18/2023] escitalopram  (LEXAPRO ) 20 MG tablet Take 1 tablet (20 mg total) by mouth at bedtime. 90 tablet 1   [START ON 06/18/2023] risperiDONE  (RISPERDAL ) 1 MG tablet Take 1 tablet (1 mg total) by mouth 2 (two) times daily. 180 tablet 0   traZODone  (DESYREL ) 50 MG tablet Take 1 tablet (50 mg total) by mouth at bedtime. 90 tablet 0   No current facility-administered medications for this visit.     Musculoskeletal: Strength & Muscle Tone: within normal limits Gait & Station: normal Patient leans: N/A  Psychiatric Specialty Exam: Review of Systems  Psychiatric/Behavioral:  Negative for agitation, behavioral problems, confusion, decreased concentration,  dysphoric mood, hallucinations, self-injury, sleep disturbance and suicidal ideas. The patient is nervous/anxious. The patient is not hyperactive.   All other systems reviewed and are negative.   Blood pressure 90/62, pulse (!) 108, temperature 97.6 F (36.4 C), temperature source Temporal, height 5\' 8"  (1.727 m), weight 166 lb (75.3 kg).Body mass index is 25.24 kg/m.  General Appearance: Well Groomed  Eye Contact:  Good  Speech:  Clear and Coherent  Volume:  Normal  Mood:   good  Affect:  Appropriate, Non-Congruent, and Restricted  Thought Process:  Coherent  Orientation:  Full (Time, Place, and Person)  Thought Content: Logical   Suicidal Thoughts:  No  Homicidal Thoughts:  No  Memory:  Immediate;   Good  Judgement:  Good  Insight:  Good  Psychomotor Activity:  Normal, Normal tone, no rigidity, no resting/postural tremors, no tardive dyskinesia    Concentration:  Concentration: Good and Attention Span: Good  Recall:  Good  Fund of Knowledge: Good  Language: Good  Akathisia:  No  Handed:  Right  AIMS (if indicated): 0   Assets:  Communication Skills Desire for Improvement  ADL's:  Intact  Cognition: WNL  Sleep:  Fair   Screenings: AUDIT    Flowsheet Row Admission (Discharged) from 07/02/2021 in [facility]) [facility]  Alcohol Use Disorder Identification Test Final Score (AUDIT) 1 0      GAD-7    Flowsheet Row Office Visit from 05/17/2023 in [facility] Office Visit from 01/28/2022 in [facility] Office Visit from 11/05/2021 in [facility] Office Visit from 09/10/2021 in [facility] Office Visit from 08/10/2021 in [facility]  Total GAD-7 Score 0 4 2 7 7       PHQ2-9     Flowsheet Row Office Visit from 05/17/2023 in [facility] Office Visit from 01/28/2022 in [facility] Office Visit from 11/05/2021 in [facility] Office Visit from 09/10/2021 in [facility] Office Visit from 08/10/2021 in [facility]  PHQ-2 Total Score 1 1 1 2 1   PHQ-9 Total Score -- 3 5 8  --      Flowsheet Row ED from 04/11/2023 in [facility] Visit from 11/05/2021 in [facility] Office Visit from 09/10/2021 in [facility]  C-SSRS RISK CATEGORY No Risk No Risk No Risk        Assessment and Plan:  EMMANUEL LUDLUM is a 58 y.o. year old female with a history of depression, pulmonary emboli, DVT, TIA , who presents for follow up appointment for below.   1. MDD (major depressive disorder), recurrent episode, mild (HCC) 2. Anxiety state Acute stressors include: loss of her cousin at age 32 ("mentally challenged") friend at church, who Anna Adkins grows up with Chronic stressors include: mother in retirement home (will be moving into apartment with her sister), empty nest   History- admitted in 05/2020, found to be tangential, talking non sense, dx depression with psychosis, 06/2021 for disorganized thoughts, paranoia, hallucinations in the setting of COPD exacerbation   Although Anna Adkins demonstrates slightly restricted affect, Anna Adkins is calm through the visit, and there has been overall improvement in depressive symptoms and anxiety since the last visit.  Anna Adkins reports a good relationship with her mother, who lives nearby, and is looking forward to taking on a bit more walk and spending more time outside, as Anna Adkins will have increased access to a vehicle during her daughter's summer vacation.   Will continue current dose of Lexapro  to target anxiety, depression.  Will continue  risperidone  given history of psychosis.   3. High risk medication use Anna Adkins was reportedly seen by her primary care.  Anna Adkins agrees to send the blood test results to our office.  We will obtain labs to monitor prolactin.  Will also get labs for thyroid given it was abnormal in the past.    Plan  Continue Lexapro  20 mg daily  Continue Risperdal  1 mg twice a day- monitor weight gain (EKG HR 96, QTC 439 msec, 06/2021)  Continue trazodone  50 mg at night as needed for insomnia Obtain lab- prolactin, TSH, fT4 at [facility] Next appointment: 7/31 at 2:30, IP Anna Adkins will ask her PCP to fax lab result (lipid panels, glucose)    The patient demonstrates the following risk factors for suicide: Chronic risk factors for suicide include: psychiatric disorder of depression . Acute risk factors for suicide include: family or marital conflict, unemployment, social withdrawal/isolation, and recent discharge from inpatient psychiatry. Protective factors for this patient include: positive social support, coping skills, and hope for the future. Considering these factors, the overall suicide risk at this point appears to be low. Patient is appropriate for outpatient follow up.       Collaboration of Care: Collaboration of Care: Other reviewed notes in Epic  Patient/Guardian was advised Release of Information must be obtained prior to any record release in order to collaborate their care with an outside provider. Patient/Guardian was advised if they have not already done so to contact the registration department to sign all necessary forms in order for us  to release information regarding their care.   Consent: Patient/Guardian gives verbal consent for treatment and assignment of benefits for services provided during this visit. Patient/Guardian expressed understanding and agreed to proceed.    Todd Fossa, MD 05/17/2023, 3:45 PM

## 2023-05-17 ENCOUNTER — Ambulatory Visit (INDEPENDENT_AMBULATORY_CARE_PROVIDER_SITE_OTHER): Admitting: Psychiatry

## 2023-05-17 ENCOUNTER — Encounter: Payer: Self-pay | Admitting: Psychiatry

## 2023-05-17 ENCOUNTER — Other Ambulatory Visit: Payer: Self-pay

## 2023-05-17 VITALS — BP 90/62 | HR 108 | Temp 97.6°F | Ht 68.0 in | Wt 166.0 lb

## 2023-05-17 DIAGNOSIS — Z79899 Other long term (current) drug therapy: Secondary | ICD-10-CM | POA: Diagnosis not present

## 2023-05-17 DIAGNOSIS — F411 Generalized anxiety disorder: Secondary | ICD-10-CM | POA: Diagnosis not present

## 2023-05-17 DIAGNOSIS — F330 Major depressive disorder, recurrent, mild: Secondary | ICD-10-CM

## 2023-05-17 MED ORDER — RISPERIDONE 1 MG PO TABS: 1.0000 mg | ORAL_TABLET | Freq: Two times a day (BID) | ORAL | 0 refills | Status: DC

## 2023-05-17 MED ORDER — TRAZODONE HCL 50 MG PO TABS: 50.0000 mg | ORAL_TABLET | Freq: Every day | ORAL | 0 refills | Status: DC

## 2023-05-17 MED ORDER — ESCITALOPRAM OXALATE 20 MG PO TABS: 20.0000 mg | ORAL_TABLET | Freq: Every day | ORAL | 1 refills | Status: DC

## 2023-05-17 NOTE — Patient Instructions (Signed)
 Continue Lexapro  20 mg daily  Continue Risperdal  1 mg twice a day Continue trazodone  50 mg at night as needed for insomnia Obtain lab- (prolactin, TSH, fT4) Next appointment: 7/31 at 2:30

## 2023-08-06 NOTE — Progress Notes (Unsigned)
 [facility] MD/PA/NP OP Progress Note  08/11/2023 3:07 PM Anna Adkins  MRN:  [id number]  Chief Complaint:  Chief Complaint  Patient presents with   Follow-up   HPI:  This is a follow-up appointment for depression and anxiety.  She states that she has been doing good.  She spends time, watching TV.  She reports good relationship with her mother and her daughter.  She is planning to see her sister from [place] , who has been visiting.  She reports good relationship with her, although she has not been able to see her for the past few years.  She denies feeling depressed.  When she was asked about her body appears to be tense, she states that she has some trembling for the past 6 months.  She sleeps up to 10 hours interview refreshed.  She has been eating 1.5 meal per day only for a while.  She denies SI, HI, hallucinations, paranoia.  She was recently recommended to get test for lung by her primary care provider.  She denies shortness of breath or dyspnea.  She agrees with the plans as outlined below.   Wt Readings from Last 3 Encounters:  08/11/23 153 lb 3.2 oz (69.5 kg)  05/17/23 166 lb (75.3 kg)  08/30/22 207 lb 9.6 oz (94.2 kg)     Household:  daughter Marital status: single, never married Number of children:4 Employment: on disability for DVT in 2014. She used to work at [employer] in 2013 (the job was unbearable to her) Education:  high school  Visit Diagnosis:    ICD-10-CM   1. MDD (major depressive disorder), recurrent, in partial remission (HCC)  F33.41     2. Anxiety state  F41.1     3. High risk medication use  Z79.899     4. Loss of weight  R63.4       Past Psychiatric History: Please see initial evaluation for full details. I have reviewed the history. No updates at this time.     Past Medical History:  Past Medical History:  Diagnosis Date   Anxiety    Anxiety    Arthritis    Blood clot in vein    Depression    Depression    DVT (deep venous thrombosis) (HCC)     DVT of leg (deep venous thrombosis) (HCC) 2014   Pulmonary emboli (HCC)    Stroke (HCC)    TIA (transient ischemic attack) 2014    Past Surgical History:  Procedure Laterality Date   Blood clot removal     ECTOPIC PREGNANCY SURGERY     HERNIA REPAIR     HERNIA REPAIR  2001   SKIN GRAFT     TUBAL LIGATION      Family Psychiatric History: Please see initial evaluation for full details. I have reviewed the history. No updates at this time.     Family History:  Family History  Problem Relation Age of Onset   Healthy Mother    Heart disease Father    Diabetes Father    Asthma Father    Breast cancer Neg Hx     Social History:  Social History   Socioeconomic History   Marital status: Single    Spouse name: Not on file   Number of children: 4   Years of education: Not on file   Highest education level: High school graduate  Occupational History   Not on file  Tobacco Use   Smoking status: Every Day  Current packs/day: 1.00    Average packs/day: 1 pack/day for 20.0 years (20.0 ttl pk-yrs)    Types: Cigarettes   Smokeless tobacco: Never  Vaping Use   Vaping status: Never Used  Substance and Sexual Activity   Alcohol use: Yes    Alcohol/week: 4.0 standard drinks of alcohol    Types: 4 Standard drinks or equivalent per week    Comment: occasional   Drug use: No   Sexual activity: Not Currently  Other Topics Concern   Not on file  Social History Narrative   Not on file   Social Drivers of Health   Financial Resource Strain: Not on file  Food Insecurity: Not on file  Transportation Needs: Not on file  Physical Activity: Not on file  Stress: Not on file  Social Connections: Not on file    Allergies:  Allergies  Allergen Reactions   Sulfa Antibiotics Anaphylaxis    Metabolic Disorder Labs: Lab Results  Component Value Date   HGBA1C 5.8 (H) 05/18/2020   MPG 119.76 05/18/2020   No results found for: PROLACTIN Lab Results  Component Value Date    CHOL 173 05/18/2020   TRIG 224 (H) 05/18/2020   HDL 37 (L) 05/18/2020   CHOLHDL 4.7 05/18/2020   VLDL 45 (H) 05/18/2020   LDLCALC 91 05/18/2020   Lab Results  Component Value Date   TSH 6.221 (H) 06/28/2021   TSH 3.038 05/17/2020    Therapeutic Level Labs: No results found for: LITHIUM No results found for: VALPROATE No results found for: CBMZ  Current Medications: Current Outpatient Medications  Medication Sig Dispense Refill   ELIQUIS  5 MG TABS tablet Take 5 mg by mouth 2 (two) times daily.     escitalopram  (LEXAPRO ) 20 MG tablet Take 1 tablet (20 mg total) by mouth at bedtime. 90 tablet 1   fluticasone  (FLONASE ) 50 MCG/ACT nasal spray Place into both nostrils.     loratadine  (CLARITIN ) 10 MG tablet Take 10 mg by mouth daily.     Multiple Vitamin (MULTIVITAMIN WITH MINERALS) TABS tablet Take 1 tablet by mouth daily.     risperiDONE  (RISPERDAL ) 1 MG tablet Take 1 tablet (1 mg total) by mouth 2 (two) times daily. 180 tablet 0   SYMBICORT 80-4.5 MCG/ACT inhaler Inhale into the lungs.     traZODone  (DESYREL ) 50 MG tablet Take 1 tablet (50 mg total) by mouth at bedtime. 90 tablet 0   triamcinolone (KENALOG) 0.025 % cream Apply topically.     VENTOLIN  HFA 108 (90 Base) MCG/ACT inhaler Inhale 1-2 puffs into the lungs every 4 (four) hours as needed.     No current facility-administered medications for this visit.     Musculoskeletal: Strength & Muscle Tone: within normal limits Gait & Station: normal Patient leans: N/A  Psychiatric Specialty Exam: Review of Systems  Blood pressure 110/68, pulse 83, temperature 98.3 F (36.8 C), temperature source Temporal, height 5' 8 (1.727 m), weight 153 lb 3.2 oz (69.5 kg), SpO2 (!) 87%.Body mass index is 23.29 kg/m.  General Appearance: Well Groomed  Eye Contact:  Good  Speech:  Clear and Coherent  Volume:  Normal  Mood:  good  Affect:  Appropriate, Congruent, and Restricted  Thought Process:  Coherent  Orientation:  Full  (Time, Place, and Person)  Thought Content: Logical   Suicidal Thoughts:  No  Homicidal Thoughts:  No  Memory:  Immediate;   Good  Judgement:  Good  Insight:  Good  Psychomotor Activity:  normal tonus, no  pill rolling tremor, mild postural tremors  Concentration:  Concentration: Good and Attention Span: Good  Recall:  Good  Fund of Knowledge: Good  Language: Good  Akathisia:  No  Handed:  Right  AIMS (if indicated): 0   Assets:  Communication Skills Desire for Improvement  ADL's:  Intact  Cognition: WNL  Sleep:  Good   Screenings: AUDIT    Flowsheet Row Admission (Discharged) from 07/02/2021 in [facility] Admission (Discharged) from [facility]  Alcohol Use Disorder Identification Test Final Score (AUDIT) 1 0   GAD-7    Flowsheet Row Office Visit from 08/11/2023 in [facility] Office Visit from 05/17/2023 in [facility] Office Visit from 01/28/2022 in [facility] Office Visit from 11/05/2021 in [facility] Office Visit from 09/10/2021 in [facility]  Total GAD-7 Score 3 0 4 2 7    PHQ2-9    Flowsheet Row Office Visit from 08/11/2023 in [facility] Office Visit from 05/17/2023 in [facility] Office Visit from 01/28/2022 in [facility] Office Visit from 11/05/2021 in [facility] Office Visit from 09/10/2021 in [facility]  PHQ-2 Total Score 1 1 1 1 2   PHQ-9 Total Score -- -- 3 5 8    Flowsheet Row Office Visit from 08/11/2023 in [facility] ED from 04/11/2023 in [facility]  Department at [facility] Visit from 11/05/2021 in [facility]  C-SSRS RISK CATEGORY No Risk No Risk No Risk     Assessment and Plan:  ADLENE ADDUCI is a 58 y.o. year old female with a history of depression, pulmonary emboli, DVT, TIA , who presents for follow up appointment for below.   1. MDD (major depressive disorder), recurrent , in partial remission (HCC) 2. Anxiety state Acute stressors include: loss of her cousin at age 5 (mentally challenged) friend at church, who she grows up with Chronic stressors include: mother in retirement home (will be moving into apartment with her sister), empty nest   History- admitted in 05/2020, found to be tangential, talking non sense, dx depression with psychosis, 06/2021 for disorganized thoughts, paranoia, hallucinations in the setting of COPD exacerbation   Although her affect continues to be restricted, and her body appears to be tense, there has been consistent improvement in depressive symptoms and anxiety since the last visit. She reports a good relationship with her mother, who lives nearby, and is looking forward to seeing her sister, who is visiting from [place] .  Will continue current dose of Lexapro  to target depression and anxiety.  Will continue Risperdal  given her history of psychosis.   3. High risk medication use She was advised again to send the lab results to our office.  She was also advised to obtain labs for thyroid (abnormality in previous test) and prolactin.   # weight loss She has significant weight loss.  Her saturation was low on today's visit.  She smokes cigarette, although has no diagnosis of COPD.  She denies shortness of breath. She was advised to contact her primary care provider after this visit.   Plan  Continue Lexapro  20 mg daily  Continue Risperdal  1 mg twice a day- monitor weight gain (EKG HR 96, QTC 439  msec, 06/2021)  Continue trazodone  50 mg at night as  needed for insomnia Obtain lab- prolactin, TSH, fT4 at [facility] Next appointment: 9/25 at 2:30, IP She will ask her PCP to fax lab result (lipid panels, glucose)     The patient demonstrates the following risk factors for suicide: Chronic risk factors for suicide include: psychiatric disorder of depression . Acute risk factors for suicide include: family or marital conflict, unemployment, social withdrawal/isolation, and recent discharge from inpatient psychiatry. Protective factors for this patient include: positive social support, coping skills, and hope for the future. Considering these factors, the overall suicide risk at this point appears to be low. Patient is appropriate for outpatient follow up.     Collaboration of Care: Collaboration of Care: Other reviewed notes in Epic  Patient/Guardian was advised Release of Information must be obtained prior to any record release in order to collaborate their care with an outside provider. Patient/Guardian was advised if they have not already done so to contact the registration department to sign all necessary forms in order for us  to release information regarding their care.   Consent: Patient/Guardian gives verbal consent for treatment and assignment of benefits for services provided during this visit. Patient/Guardian expressed understanding and agreed to proceed.    Katheren Sleet, MD 08/11/2023, 3:07 PM

## 2023-08-10 ENCOUNTER — Other Ambulatory Visit: Payer: Self-pay | Admitting: Nurse Practitioner

## 2023-08-10 DIAGNOSIS — Z1231 Encounter for screening mammogram for malignant neoplasm of breast: Secondary | ICD-10-CM

## 2023-08-10 LAB — LAB REPORT - SCANNED
A1c: 5.3
EGFR: 87
HM HIV Screening: NEGATIVE
HM Hepatitis Screen: NEGATIVE
TSH: 1.95

## 2023-08-11 ENCOUNTER — Ambulatory Visit (INDEPENDENT_AMBULATORY_CARE_PROVIDER_SITE_OTHER): Admitting: Psychiatry

## 2023-08-11 ENCOUNTER — Encounter: Payer: Self-pay | Admitting: Psychiatry

## 2023-08-11 VITALS — BP 110/68 | HR 83 | Temp 98.3°F | Ht 68.0 in | Wt 153.2 lb

## 2023-08-11 DIAGNOSIS — R634 Abnormal weight loss: Secondary | ICD-10-CM | POA: Diagnosis not present

## 2023-08-11 DIAGNOSIS — F3341 Major depressive disorder, recurrent, in partial remission: Secondary | ICD-10-CM | POA: Diagnosis not present

## 2023-08-11 DIAGNOSIS — F411 Generalized anxiety disorder: Secondary | ICD-10-CM

## 2023-08-11 DIAGNOSIS — Z79899 Other long term (current) drug therapy: Secondary | ICD-10-CM | POA: Diagnosis not present

## 2023-08-11 LAB — CHLAMYDIA/GC AMPLIFICATION: Chlamydia, Swab/Urine, PCR: NEGATIVE

## 2023-08-11 NOTE — Patient Instructions (Addendum)
 Continue Lexapro  20 mg daily  Continue Risperdal  1 mg twice a day  Continue trazodone  50 mg at night as needed for insomnia Obtain lab- prolactin, TSH, fT4 at [facility] Next appointment: 9/25 at 2:30

## 2023-08-22 ENCOUNTER — Telehealth: Payer: Self-pay | Admitting: Psychiatry

## 2023-08-22 NOTE — Telephone Encounter (Signed)
 Received labs done at [facility] 08/12/1023 CBC wnl except Ht 48.1 CMP wnl HbA1c 5.3 Prolactin 124H TSH 1.95

## 2023-09-27 ENCOUNTER — Ambulatory Visit: Payer: Self-pay | Admitting: Psychiatry

## 2023-09-27 LAB — PROLACTIN: Prolactin: 105 ng/mL — ABNORMAL HIGH (ref 3.6–25.2)

## 2023-09-27 LAB — T4, FREE: Free T4: 1.24 ng/dL (ref 0.82–1.77)

## 2023-09-27 LAB — TSH: TSH: 3.52 u[IU]/mL (ref 0.450–4.500)

## 2023-09-27 NOTE — Progress Notes (Signed)
 Please contact the patient. Her prolactin level remains elevated, though it is lower compared to previous testing and is likely related to risperidone  use. Advise her to continue on the current dose, and we will plan to recheck levels in several months. Her thyroid function is within normal range.

## 2023-09-27 NOTE — Progress Notes (Signed)
 Called patient to inform of the lab results patient voiced understanding

## 2023-10-04 NOTE — Progress Notes (Unsigned)
 [facility] MD/PA/NP OP Progress Note  10/06/2023 2:59 PM Anna Adkins  MRN:  [id number]  Chief Complaint:  Chief Complaint  Patient presents with   Follow-up   HPI:  This is a follow-up appointment for depression, anxiety and insomnia.  She states that she has been doing well.  She was able to see her sister who visited her from [place] .  It has been less stressful as her mother has a vehicle to bring her to places.  She enjoys going out with her friend, and is planning to get a haircut.  She is hoping to the church when she is able to get the clothing to go there as she has lost weight.  She has been healthier.  She has been eating better, and sleeps 10 hours.  It is good that she does not stop in the house all the time anymore.  She denies feeling depressed or anxiety.  She denies SI, HI, hallucinations.  She agrees with the plans as outlined below.   Wt Readings from Last 3 Encounters:  10/06/23 156 lb 3.2 oz (70.9 kg)  08/11/23 153 lb 3.2 oz (69.5 kg)  05/17/23 166 lb (75.3 kg)     Household:  daughter Marital status: single, never married Number of children:4 Employment: on disability for DVT in 2014. She used to work at [employer] in 2013 (the job was unbearable to her) Education:  high school  Visit Diagnosis:    ICD-10-CM   1. MDD (major depressive disorder), recurrent, in partial remission  F33.41     2. Anxiety state  F41.1     3. Insomnia, unspecified type  G47.00     4. High risk medication use  Z79.899 Lipid panel      Past Psychiatric History: Please see initial evaluation for full details. I have reviewed the history. No updates at this time.     Past Medical History:  Past Medical History:  Diagnosis Date   Anxiety    Anxiety    Arthritis    Blood clot in vein    Depression    Depression    DVT (deep venous thrombosis) (HCC)    DVT of leg (deep venous thrombosis) (HCC) 2014   Pulmonary emboli (HCC)    Stroke (HCC)    TIA (transient ischemic attack)  2014    Past Surgical History:  Procedure Laterality Date   Blood clot removal     ECTOPIC PREGNANCY SURGERY     HERNIA REPAIR     HERNIA REPAIR  2001   SKIN GRAFT     TUBAL LIGATION      Family Psychiatric History: Please see initial evaluation for full details. I have reviewed the history. No updates at this time.     Family History:  Family History  Problem Relation Age of Onset   Healthy Mother    Heart disease Father    Diabetes Father    Asthma Father    Breast cancer Neg Hx     Social History:  Social History   Socioeconomic History   Marital status: Single    Spouse name: Not on file   Number of children: 4   Years of education: Not on file   Highest education level: High school graduate  Occupational History   Not on file  Tobacco Use   Smoking status: Every Day    Current packs/day: 1.00    Average packs/day: 1 pack/day for 20.0 years (20.0 ttl pk-yrs)    Types:  Cigarettes   Smokeless tobacco: Never  Vaping Use   Vaping status: Never Used  Substance and Sexual Activity   Alcohol use: Yes    Alcohol/week: 4.0 standard drinks of alcohol    Types: 4 Standard drinks or equivalent per week    Comment: occasional   Drug use: No   Sexual activity: Not Currently  Other Topics Concern   Not on file  Social History Narrative   Not on file   Social Drivers of Health   Financial Resource Strain: Not on file  Food Insecurity: Not on file  Transportation Needs: Not on file  Physical Activity: Not on file  Stress: Not on file  Social Connections: Not on file    Allergies:  Allergies  Allergen Reactions   Sulfa Antibiotics Anaphylaxis    Metabolic Disorder Labs: Lab Results  Component Value Date   HGBA1C 5.8 (H) 05/18/2020   MPG 119.76 05/18/2020   Lab Results  Component Value Date   PROLACTIN 105.0 (H) 09/26/2023   Lab Results  Component Value Date   CHOL 173 05/18/2020   TRIG 224 (H) 05/18/2020   HDL 37 (L) 05/18/2020   CHOLHDL 4.7  05/18/2020   VLDL 45 (H) 05/18/2020   LDLCALC 91 05/18/2020   Lab Results  Component Value Date   TSH 3.520 09/26/2023   TSH 1.950 08/10/2023    Therapeutic Level Labs: No results found for: LITHIUM No results found for: VALPROATE No results found for: CBMZ  Current Medications: Current Outpatient Medications  Medication Sig Dispense Refill   ELIQUIS  5 MG TABS tablet Take 5 mg by mouth 2 (two) times daily.     escitalopram  (LEXAPRO ) 20 MG tablet Take 1 tablet (20 mg total) by mouth at bedtime. 90 tablet 1   fluticasone  (FLONASE ) 50 MCG/ACT nasal spray Place into both nostrils.     loratadine  (CLARITIN ) 10 MG tablet Take 10 mg by mouth daily.     Multiple Vitamin (MULTIVITAMIN WITH MINERALS) TABS tablet Take 1 tablet by mouth daily.     risperiDONE  (RISPERDAL ) 1 MG tablet Take 1 tablet (1 mg total) by mouth 2 (two) times daily. 180 tablet 0   SYMBICORT 80-4.5 MCG/ACT inhaler Inhale into the lungs.     traZODone  (DESYREL ) 50 MG tablet Take 1 tablet (50 mg total) by mouth at bedtime. 90 tablet 0   triamcinolone (KENALOG) 0.025 % cream Apply topically.     VENTOLIN  HFA 108 (90 Base) MCG/ACT inhaler Inhale 1-2 puffs into the lungs every 4 (four) hours as needed.     No current facility-administered medications for this visit.     Musculoskeletal: Strength & Muscle Tone: within normal limits Gait & Station: normal Patient leans: N/A  Psychiatric Specialty Exam: Review of Systems  Psychiatric/Behavioral:  Negative for agitation, behavioral problems, confusion, decreased concentration, dysphoric mood, hallucinations, self-injury, sleep disturbance and suicidal ideas. The patient is not nervous/anxious and is not hyperactive.   All other systems reviewed and are negative.   Blood pressure 92/65, pulse 69, temperature (!) 97.3 F (36.3 C), temperature source Temporal, height 5' 8 (1.727 m), weight 156 lb 3.2 oz (70.9 kg).Body mass index is 23.75 kg/m.  General Appearance:  Well Groomed  Eye Contact:  Good  Speech:  Clear and Coherent  Volume:  Normal  Mood:  good  Affect:  Appropriate, Congruent, and Restricted  Thought Process:  Coherent  Orientation:  Full (Time, Place, and Person)  Thought Content: Logical   Suicidal Thoughts:  No  Homicidal  Thoughts:  No  Memory:  Immediate;   Good  Judgement:  Good  Insight:  Good  Psychomotor Activity:  Normal, Normal tone, no rigidity, no resting/postural tremors, no tardive dyskinesia    Concentration:  Concentration: Good and Attention Span: Good  Recall:  Good  Fund of Knowledge: Good  Language: Good  Akathisia:  No  Handed:  Right  AIMS (if indicated): 0   Assets:  Communication Skills Desire for Improvement  ADL's:  Intact  Cognition: WNL  Sleep:  Good   Screenings: AUDIT    Flowsheet Row Admission (Discharged) from 07/02/2021 in [facility]) [facility]  Alcohol Use Disorder Identification Test Final Score (AUDIT) 1 0   GAD-7    Flowsheet Row Office Visit from 08/11/2023 in [facility] Office Visit from 05/17/2023 in [facility] Office Visit from 01/28/2022 in [facility] Visit from 11/05/2021 in [facility] Office Visit from 09/10/2021 in [facility]  Total GAD-7 Score 3 0 4 2 7    PHQ2-9    Flowsheet Row Office Visit from 08/11/2023 in [facility] Office Visit from 05/17/2023 in [facility] Office Visit from 01/28/2022 in [facility] Office Visit from 11/05/2021 in [facility] Office Visit from 09/10/2021 in [facility]  PHQ-2 Total Score 1 1 1 1 2   PHQ-9 Total Score -- -- 3 5 8    Flowsheet Row Office Visit from 08/11/2023 in [facility] ED from 04/11/2023 in [facility] Visit from 11/05/2021 in [facility]  C-SSRS RISK CATEGORY No Risk No Risk No Risk     Assessment and Plan:  Anna Adkins is a 58 y.o. year old female with a history of depression, pulmonary emboli, DVT, TIA , who presents for follow up appointment for below.   1. MDD (major depressive disorder), recurrent, in partial remission 2. Anxiety state Acute stressors include: loss of her cousin at age 58 (mentally challenged) friend at church, who she grows up with Chronic stressors include: mother in retirement home (will be moving into apartment with her sister), empty nest   History- admitted in 05/2020, found to be tangential, talking non sense, dx depression with psychosis, 06/2021 for disorganized thoughts, paranoia, hallucinations in the setting of COPD exacerbation   Although she continues to demonstrate restricted affect, she reports consistent improvement in her mood symptoms since her last visit.  She has been working on behavioral activation, and a healthy diet.  She continues to maintain in her relationship with her mother, who lives nearby.  Will continue current dose of Lexapro  to target depression and anxiety.  Will continue Risperdal  given history of psychosis.   3. Insomnia, unspecified type Overall stable.  Will continue current dose of trazodone  as needed for insomnia.   4. High risk medication use She agrees to obtain lipid panels as it has not been checked.  Discussed that prolactin level will be continued to be monitored.      Last checked  EKG HR 76, QTc440msec 03/2023  Lipid panels    HbA1c 5.3 08/2023  Prolactin 105 09/2023    Plan  Continue Lexapro  20 mg daily  Continue Risperdal  1 mg twice a day- monitor weight gain (EKG HR 96, QTC 439 msec, 06/2021)  Continue trazodone  50 mg at night as needed for insomnia Obtain lab- lipid panels at [facility] Next appointment:12/2 at 2:30, IP   The patient demonstrates the following risk factors for suicide: Chronic risk factors for suicide include: psychiatric disorder of depression . Acute risk factors for suicide include: family or marital conflict, unemployment, social withdrawal/isolation, and recent discharge from inpatient psychiatry. Protective factors for this patient include: positive social support, coping skills, and hope for the future. Considering these factors, the overall suicide risk at this point appears to be low. Patient is appropriate for outpatient follow up.     Collaboration of Care: Collaboration of Care: Other reviewed notes in Epic  Patient/Guardian was advised Release of Information must be obtained prior to any record release in order to collaborate their care with an outside provider. Patient/Guardian was advised if they have not already done so to contact the registration department to sign all necessary forms in order for us  to release information regarding their care.   Consent: Patient/Guardian gives verbal consent for treatment and assignment of benefits for services provided during this visit. Patient/Guardian expressed understanding and agreed to proceed.    Katheren Sleet, MD 10/06/2023, 2:59 PM

## 2023-10-05 ENCOUNTER — Encounter: Payer: Self-pay | Admitting: Nurse Practitioner

## 2023-10-06 ENCOUNTER — Encounter: Payer: Self-pay | Admitting: Psychiatry

## 2023-10-06 ENCOUNTER — Other Ambulatory Visit: Payer: Self-pay

## 2023-10-06 ENCOUNTER — Ambulatory Visit (INDEPENDENT_AMBULATORY_CARE_PROVIDER_SITE_OTHER): Admitting: Psychiatry

## 2023-10-06 VITALS — BP 92/65 | HR 69 | Temp 97.3°F | Ht 68.0 in | Wt 156.2 lb

## 2023-10-06 DIAGNOSIS — F3341 Major depressive disorder, recurrent, in partial remission: Secondary | ICD-10-CM | POA: Diagnosis not present

## 2023-10-06 DIAGNOSIS — Z79899 Other long term (current) drug therapy: Secondary | ICD-10-CM

## 2023-10-06 DIAGNOSIS — G4700 Insomnia, unspecified: Secondary | ICD-10-CM

## 2023-10-06 DIAGNOSIS — F411 Generalized anxiety disorder: Secondary | ICD-10-CM

## 2023-10-06 MED ORDER — RISPERIDONE 1 MG PO TABS: 1.0000 mg | ORAL_TABLET | Freq: Two times a day (BID) | ORAL | 0 refills | Status: DC

## 2023-10-06 MED ORDER — TRAZODONE HCL 50 MG PO TABS: 50.0000 mg | ORAL_TABLET | Freq: Every day | ORAL | 0 refills | Status: DC

## 2023-10-06 NOTE — Addendum Note (Signed)
 Addended by: Mar Walmer on: 10/06/2023 03:31 PM   Modules accepted: Orders

## 2023-10-06 NOTE — Patient Instructions (Signed)
 Continue Lexapro  20 mg daily  Continue Risperdal  1 mg twice a day Continue trazodone  50 mg at night as needed for insomnia Obtain lab- lipid panels at [facility] Next appointment:12/2 at 2:30

## 2023-10-07 ENCOUNTER — Other Ambulatory Visit: Payer: Self-pay | Admitting: Psychiatry

## 2023-10-19 ENCOUNTER — Ambulatory Visit
Admission: RE | Admit: 2023-10-19 | Discharge: 2023-10-19 | Disposition: A | Discharge status: Home / Self Care | Source: Ambulatory Visit | Attending: Nurse Practitioner | Admitting: Nurse Practitioner

## 2023-10-19 DIAGNOSIS — Z1231 Encounter for screening mammogram for malignant neoplasm of breast: Secondary | ICD-10-CM | POA: Insufficient documentation

## 2023-10-20 LAB — LIPID PANEL
Chol/HDL Ratio: 5.4 ratio — ABNORMAL HIGH (ref 0.0–4.4)
Cholesterol, Total: 221 mg/dL — ABNORMAL HIGH (ref 100–199)
HDL: 41 mg/dL (ref >39)
LDL Chol Calc (NIH): 159 mg/dL — ABNORMAL HIGH (ref 0–99)
Triglycerides: 114 mg/dL (ref 0–149)
VLDL Cholesterol Cal: 21 mg/dL (ref 5–40)

## 2023-10-24 ENCOUNTER — Telehealth: Payer: Self-pay | Admitting: Psychiatry

## 2023-10-24 NOTE — Telephone Encounter (Signed)
 Please contact her that based on the recent lab results, cholesterol and LDL levels were higher than the normal range, assuming the test was done fasting. I recommend she contact her primary care provider to discuss possible treatment options.  Lipid Panel     Component Value Date/Time   CHOL 221 (H) 10/19/2023 1341   TRIG 114 10/19/2023 1341   HDL 41 10/19/2023 1341   CHOLHDL 5.4 (H) 10/19/2023 1341   CHOLHDL 4.7 05/18/2020 1736   VLDL 45 (H) 05/18/2020 1736   LDLCALC 159 (H) 10/19/2023 1341   LABVLDL 21 10/19/2023 1341

## 2023-10-26 NOTE — Telephone Encounter (Signed)
 Medication management - Telephone message left for patient to inform of her recent lab results and request for patient to follow up with her PCP regarding her increased cholesterol and LDL levels, particularly if patient was not fasting. Requested patient call our office back if she would like to discuss further.

## 2023-12-10 NOTE — Progress Notes (Signed)
 No show

## 2023-12-13 ENCOUNTER — Ambulatory Visit: Admitting: Psychiatry

## 2023-12-13 DIAGNOSIS — Z91199 Patient's noncompliance with other medical treatment and regimen due to unspecified reason: Secondary | ICD-10-CM

## 2024-01-27 ENCOUNTER — Other Ambulatory Visit: Payer: Self-pay | Admitting: Psychiatry

## 2024-01-31 ENCOUNTER — Other Ambulatory Visit: Payer: Self-pay | Admitting: Psychiatry

## 2024-01-31 ENCOUNTER — Telehealth: Payer: Self-pay | Admitting: Psychiatry

## 2024-01-31 MED ORDER — ESCITALOPRAM OXALATE 20 MG PO TABS: 20.0000 mg | ORAL_TABLET | Freq: Every day | ORAL | 0 refills | Status: AC

## 2024-01-31 MED ORDER — TRAZODONE HCL 50 MG PO TABS: 50.0000 mg | ORAL_TABLET | Freq: Every day | ORAL | 0 refills | Status: AC

## 2024-01-31 NOTE — Telephone Encounter (Signed)
 Ordered

## 2024-01-31 NOTE — Telephone Encounter (Signed)
 Patient has scheduled in person appointment for 03-06-24 and attendance policy reviewed. Patient requesting medication refill stating she is out of medication. Please advise

## 2024-03-06 ENCOUNTER — Ambulatory Visit: Admitting: Psychiatry
